# Patient Record
Sex: Female | Born: 1955 | Race: Asian | Hispanic: No | Marital: Married | Smoking: Never smoker
Health system: Southern US, Community
[De-identification: ages and names within clinical notes are randomized; demographics above are authoritative.]

## PROBLEM LIST (undated history)

## (undated) DIAGNOSIS — E785 Hyperlipidemia, unspecified: Secondary | ICD-10-CM

## (undated) DIAGNOSIS — I340 Nonrheumatic mitral (valve) insufficiency: Secondary | ICD-10-CM

## (undated) DIAGNOSIS — I219 Acute myocardial infarction, unspecified: Secondary | ICD-10-CM

## (undated) DIAGNOSIS — E119 Type 2 diabetes mellitus without complications: Secondary | ICD-10-CM

## (undated) DIAGNOSIS — I255 Ischemic cardiomyopathy: Secondary | ICD-10-CM

## (undated) DIAGNOSIS — K5909 Other constipation: Secondary | ICD-10-CM

## (undated) DIAGNOSIS — E1140 Type 2 diabetes mellitus with diabetic neuropathy, unspecified: Secondary | ICD-10-CM

## (undated) DIAGNOSIS — M545 Low back pain, unspecified: Secondary | ICD-10-CM

## (undated) DIAGNOSIS — G8929 Other chronic pain: Secondary | ICD-10-CM

## (undated) DIAGNOSIS — I2510 Atherosclerotic heart disease of native coronary artery without angina pectoris: Secondary | ICD-10-CM

## (undated) DIAGNOSIS — I420 Dilated cardiomyopathy: Secondary | ICD-10-CM

## (undated) HISTORY — PX: VAGINAL HYSTERECTOMY: SUR661

## (undated) HISTORY — PX: CATARACT EXTRACTION W/ INTRAOCULAR LENS  IMPLANT, BILATERAL: SHX1307

## (undated) HISTORY — PX: EYE SURGERY: SHX253

## (undated) HISTORY — DX: Nonrheumatic mitral (valve) insufficiency: I34.0

## (undated) HISTORY — DX: Dilated cardiomyopathy: I25.5

## (undated) HISTORY — DX: Ischemic cardiomyopathy: I42.0

## (undated) HISTORY — DX: Hyperlipidemia, unspecified: E78.5

## (undated) HISTORY — PX: CORONARY ANGIOPLASTY WITH STENT PLACEMENT: SHX49

---

## 2009-01-21 DIAGNOSIS — I219 Acute myocardial infarction, unspecified: Secondary | ICD-10-CM

## 2009-01-21 HISTORY — DX: Acute myocardial infarction, unspecified: I21.9

## 2012-02-10 ENCOUNTER — Emergency Department (HOSPITAL_COMMUNITY): Payer: No Typology Code available for payment source

## 2012-02-10 ENCOUNTER — Emergency Department (HOSPITAL_COMMUNITY)
Admission: EM | Admit: 2012-02-10 | Discharge: 2012-02-10 | Disposition: A | Discharge status: Nursing Home (Includes ICF) | Payer: No Typology Code available for payment source | Source: Home / Self Care

## 2012-02-10 ENCOUNTER — Encounter (HOSPITAL_COMMUNITY): Payer: Self-pay | Admitting: *Deleted

## 2012-02-10 ENCOUNTER — Observation Stay (HOSPITAL_COMMUNITY)
Admission: EM | Admit: 2012-02-10 | Discharge: 2012-02-11 | Disposition: A | Discharge status: Home / Self Care | Payer: No Typology Code available for payment source | Attending: Cardiology | Admitting: Cardiology

## 2012-02-10 ENCOUNTER — Encounter (HOSPITAL_COMMUNITY): Payer: Self-pay | Admitting: Emergency Medicine

## 2012-02-10 DIAGNOSIS — Z9861 Coronary angioplasty status: Secondary | ICD-10-CM | POA: Insufficient documentation

## 2012-02-10 DIAGNOSIS — R0602 Shortness of breath: Secondary | ICD-10-CM | POA: Insufficient documentation

## 2012-02-10 DIAGNOSIS — E119 Type 2 diabetes mellitus without complications: Secondary | ICD-10-CM

## 2012-02-10 DIAGNOSIS — D509 Iron deficiency anemia, unspecified: Secondary | ICD-10-CM | POA: Insufficient documentation

## 2012-02-10 DIAGNOSIS — R0989 Other specified symptoms and signs involving the circulatory and respiratory systems: Secondary | ICD-10-CM

## 2012-02-10 DIAGNOSIS — D649 Anemia, unspecified: Secondary | ICD-10-CM | POA: Diagnosis present

## 2012-02-10 DIAGNOSIS — R0600 Dyspnea, unspecified: Secondary | ICD-10-CM

## 2012-02-10 DIAGNOSIS — I252 Old myocardial infarction: Secondary | ICD-10-CM

## 2012-02-10 DIAGNOSIS — E785 Hyperlipidemia, unspecified: Secondary | ICD-10-CM | POA: Diagnosis present

## 2012-02-10 DIAGNOSIS — R0609 Other forms of dyspnea: Secondary | ICD-10-CM

## 2012-02-10 DIAGNOSIS — Z8679 Personal history of other diseases of the circulatory system: Secondary | ICD-10-CM

## 2012-02-10 DIAGNOSIS — I509 Heart failure, unspecified: Principal | ICD-10-CM | POA: Insufficient documentation

## 2012-02-10 DIAGNOSIS — R072 Precordial pain: Secondary | ICD-10-CM | POA: Insufficient documentation

## 2012-02-10 DIAGNOSIS — I10 Essential (primary) hypertension: Secondary | ICD-10-CM | POA: Insufficient documentation

## 2012-02-10 DIAGNOSIS — R079 Chest pain, unspecified: Secondary | ICD-10-CM

## 2012-02-10 DIAGNOSIS — I2510 Atherosclerotic heart disease of native coronary artery without angina pectoris: Secondary | ICD-10-CM | POA: Insufficient documentation

## 2012-02-10 DIAGNOSIS — R209 Unspecified disturbances of skin sensation: Secondary | ICD-10-CM | POA: Insufficient documentation

## 2012-02-10 HISTORY — DX: Type 2 diabetes mellitus with diabetic neuropathy, unspecified: E11.40

## 2012-02-10 HISTORY — DX: Atherosclerotic heart disease of native coronary artery without angina pectoris: I25.10

## 2012-02-10 HISTORY — DX: Hyperlipidemia, unspecified: E78.5

## 2012-02-10 LAB — CBC WITH DIFFERENTIAL/PLATELET
Basophils Absolute: 0 10*3/uL (ref 0.0–0.1)
Basophils Relative: 0 % (ref 0–1)
Eosinophils Absolute: 0.1 10*3/uL (ref 0.0–0.7)
Eosinophils Relative: 1 % (ref 0–5)
HCT: 35.8 % — ABNORMAL LOW (ref 36.0–46.0)
Hemoglobin: 11.2 g/dL — ABNORMAL LOW (ref 12.0–15.0)
Lymphocytes Relative: 35 % (ref 12–46)
Lymphs Abs: 3.3 K/uL (ref 0.7–4.0)
MCH: 22 pg — ABNORMAL LOW (ref 26.0–34.0)
MCHC: 31.3 g/dL (ref 30.0–36.0)
MCV: 70.3 fL — ABNORMAL LOW (ref 78.0–100.0)
Monocytes Absolute: 0.7 10*3/uL (ref 0.1–1.0)
Monocytes Relative: 7 % (ref 3–12)
Neutro Abs: 5.2 K/uL (ref 1.7–7.7)
Neutrophils Relative %: 56 % (ref 43–77)
Platelets: 300 10*3/uL (ref 150–400)
RBC: 5.09 MIL/uL (ref 3.87–5.11)
RDW: 16.3 % — ABNORMAL HIGH (ref 11.5–15.5)
WBC: 9.3 K/uL (ref 4.0–10.5)

## 2012-02-10 LAB — PROTIME-INR
INR: 0.94 (ref 0.00–1.49)
Prothrombin Time: 12.5 s (ref 11.6–15.2)

## 2012-02-10 LAB — BASIC METABOLIC PANEL
CO2: 27 mEq/L (ref 19–32)
Calcium: 9.2 mg/dL (ref 8.4–10.5)
Creatinine, Ser: 0.88 mg/dL (ref 0.50–1.10)
GFR calc non Af Amer: 71 mL/min — ABNORMAL LOW (ref >90)
Glucose, Bld: 155 mg/dL — ABNORMAL HIGH (ref 70–99)
Sodium: 138 mEq/L (ref 135–145)

## 2012-02-10 LAB — CREATININE, SERUM
Creatinine, Ser: 0.89 mg/dL (ref 0.50–1.10)
GFR calc Af Amer: 81 mL/min — ABNORMAL LOW (ref >90)

## 2012-02-10 LAB — GLUCOSE, CAPILLARY: Glucose-Capillary: 159 mg/dL — ABNORMAL HIGH (ref 70–99)

## 2012-02-10 LAB — CBC
Hemoglobin: 11 g/dL — ABNORMAL LOW (ref 12.0–15.0)
MCH: 22.3 pg — ABNORMAL LOW (ref 26.0–34.0)
MCV: 70.8 fL — ABNORMAL LOW (ref 78.0–100.0)
RBC: 4.93 MIL/uL (ref 3.87–5.11)

## 2012-02-10 LAB — PRO B NATRIURETIC PEPTIDE: Pro B Natriuretic peptide (BNP): 659.3 pg/mL — ABNORMAL HIGH (ref 0–125)

## 2012-02-10 LAB — APTT: aPTT: 27 seconds (ref 24–37)

## 2012-02-10 LAB — BASIC METABOLIC PANEL WITH GFR
BUN: 12 mg/dL (ref 6–23)
Chloride: 103 meq/L (ref 96–112)
GFR calc Af Amer: 82 mL/min — ABNORMAL LOW (ref >90)
Potassium: 4.9 meq/L (ref 3.5–5.1)

## 2012-02-10 LAB — TROPONIN I: Troponin I: <0.3 ng/mL (ref <0.30)

## 2012-02-10 MED ORDER — FAMOTIDINE 20 MG PO TABS
20.0000 mg | ORAL_TABLET | Freq: Two times a day (BID) | ORAL | Status: DC
  Administered 2012-02-10 – 2012-02-11 (×2): 20 mg via ORAL
  Filled 2012-02-10 (×3): qty 1

## 2012-02-10 MED ORDER — CARVEDILOL 3.125 MG PO TABS
3.1250 mg | ORAL_TABLET | Freq: Two times a day (BID) | ORAL | Status: DC
  Administered 2012-02-11: 3.125 mg via ORAL
  Filled 2012-02-10 (×3): qty 1

## 2012-02-10 MED ORDER — SODIUM CHLORIDE 0.9 % IJ SOLN: 3.0000 mL | INTRAMUSCULAR | Status: DC | PRN

## 2012-02-10 MED ORDER — ATORVASTATIN CALCIUM 40 MG PO TABS
40.0000 mg | ORAL_TABLET | Freq: Every day | ORAL | Status: DC
  Administered 2012-02-11: 40 mg via ORAL
  Filled 2012-02-10: qty 1

## 2012-02-10 MED ORDER — ONDANSETRON HCL 4 MG/2ML IJ SOLN: 4.0000 mg | Freq: Four times a day (QID) | INTRAMUSCULAR | Status: DC | PRN

## 2012-02-10 MED ORDER — INSULIN ASPART 100 UNIT/ML ~~LOC~~ SOLN
0.0000 [IU] | Freq: Three times a day (TID) | SUBCUTANEOUS | Status: DC
  Administered 2012-02-11: 3 [IU] via SUBCUTANEOUS
  Administered 2012-02-11: 1 [IU] via SUBCUTANEOUS

## 2012-02-10 MED ORDER — HEPARIN SODIUM (PORCINE) 5000 UNIT/ML IJ SOLN
5000.0000 [IU] | Freq: Three times a day (TID) | INTRAMUSCULAR | Status: DC
  Administered 2012-02-10 – 2012-02-11 (×2): 5000 [IU] via SUBCUTANEOUS
  Filled 2012-02-10 (×5): qty 1

## 2012-02-10 MED ORDER — FUROSEMIDE 10 MG/ML IJ SOLN
40.0000 mg | Freq: Once | INTRAMUSCULAR | Status: AC
  Administered 2012-02-10: 40 mg via INTRAVENOUS
  Filled 2012-02-10: qty 4

## 2012-02-10 MED ORDER — NITROGLYCERIN 2 % TD OINT
1.0000 [in_us] | TOPICAL_OINTMENT | Freq: Once | TRANSDERMAL | Status: AC
  Administered 2012-02-10: 1 [in_us] via TOPICAL
  Filled 2012-02-10: qty 1

## 2012-02-10 MED ORDER — SODIUM CHLORIDE 0.9 % IV SOLN: 250.0000 mL | INTRAVENOUS | Status: DC | PRN

## 2012-02-10 MED ORDER — INSULIN ASPART 100 UNIT/ML ~~LOC~~ SOLN
10.0000 [IU] | Freq: Three times a day (TID) | SUBCUTANEOUS | Status: DC
  Administered 2012-02-11 (×2): 10 [IU] via SUBCUTANEOUS

## 2012-02-10 MED ORDER — NITROGLYCERIN 0.4 MG SL SUBL
0.4000 mg | SUBLINGUAL_TABLET | SUBLINGUAL | Status: DC | PRN
  Administered 2012-02-10: 0.4 mg via SUBLINGUAL

## 2012-02-10 MED ORDER — CLOPIDOGREL BISULFATE 75 MG PO TABS
75.0000 mg | ORAL_TABLET | Freq: Every day | ORAL | Status: DC
  Administered 2012-02-11: 75 mg via ORAL
  Filled 2012-02-10: qty 1

## 2012-02-10 MED ORDER — ASPIRIN 81 MG PO CHEW
324.0000 mg | CHEWABLE_TABLET | Freq: Once | ORAL | Status: DC
  Filled 2012-02-10 (×2): qty 1

## 2012-02-10 MED ORDER — LISINOPRIL 2.5 MG PO TABS
2.5000 mg | ORAL_TABLET | Freq: Every day | ORAL | Status: DC
  Administered 2012-02-11: 2.5 mg via ORAL
  Filled 2012-02-10: qty 1

## 2012-02-10 MED ORDER — GABAPENTIN 300 MG PO CAPS
300.0000 mg | ORAL_CAPSULE | Freq: Three times a day (TID) | ORAL | Status: DC
  Administered 2012-02-11: 300 mg via ORAL
  Filled 2012-02-10 (×4): qty 1

## 2012-02-10 MED ORDER — SODIUM CHLORIDE 0.9 % IV SOLN: Freq: Once | INTRAVENOUS | Status: DC

## 2012-02-10 MED ORDER — GABAPENTIN 300 MG PO CAPS
300.0000 mg | ORAL_CAPSULE | Freq: Once | ORAL | Status: AC
  Administered 2012-02-10: 300 mg via ORAL
  Filled 2012-02-10: qty 1

## 2012-02-10 MED ORDER — SODIUM CHLORIDE 0.9 % IJ SOLN: 3.0000 mL | Freq: Two times a day (BID) | INTRAMUSCULAR | Status: DC

## 2012-02-10 MED ORDER — NITROGLYCERIN 0.4 MG SL SUBL: 0.4000 mg | SUBLINGUAL_TABLET | SUBLINGUAL | Status: DC | PRN

## 2012-02-10 MED ORDER — NITROGLYCERIN 0.4 MG SL SUBL
0.4000 mg | SUBLINGUAL_TABLET | SUBLINGUAL | Status: DC | PRN
  Administered 2012-02-10: 0.4 mg via SUBLINGUAL
  Filled 2012-02-10: qty 25

## 2012-02-10 MED ORDER — INSULIN GLARGINE 100 UNIT/ML ~~LOC~~ SOLN
35.0000 [IU] | Freq: Every day | SUBCUTANEOUS | Status: DC
  Administered 2012-02-10: 35 [IU] via SUBCUTANEOUS

## 2012-02-10 MED ORDER — ACETAMINOPHEN 325 MG PO TABS: 650.0000 mg | ORAL_TABLET | ORAL | Status: DC | PRN

## 2012-02-10 MED ORDER — ASPIRIN 81 MG PO CHEW
81.0000 mg | CHEWABLE_TABLET | Freq: Every day | ORAL | Status: DC
  Administered 2012-02-11: 81 mg via ORAL

## 2012-02-10 NOTE — ED Notes (Signed)
Pt placed on 12 lead EKG, pulse ox, and BP monitor.

## 2012-02-10 NOTE — ED Provider Notes (Signed)
History     CSN: [id number]  Arrival date & time 02/10/12  1002   None     Chief Complaint  Patient presents with  . Back Pain    (Consider location/radiation/quality/duration/timing/severity/associated sxs/prior treatment) HPI Comments:     57 year old female who recently moved here from [place] has a myriad of complaints.Her chief complaint is that of anterior chest pain. She describes it as a heavy feeling in his if something was sitting on her chest. It is associated with increase in dyspnea at rest and exertional. She denies diaphoresis. Her PMH is significant for MI and angioplasty in November of 2013. She is morbidly obese with several risk factors for Cardiovascular event.  She complains of various aches and pains for the past 4-5 years. These aches and pains occur in the back legs hips and arms. She has type 2 diabetes and is treated with insulin and she believes she was told that much of her pain was due to diabetic peripheral neuropathy. She is taking gabapentin that she is out of this medicine as well as several other medications. She is requesting a refill on several of her chronic medications. She is also feeling generally weak and overall poorly.  Her chief complaint is that of anterior chest pain. She describes it as a heavy feeling in his if something was sitting on her chest. It is associated with increase in dyspnea at rest and exertional. She denies diaphoresis. Her PMH is significant for MI and angioplasty in November of 2013. She is morbidly obese with several risk factors for Cardiovascular event.    Past Medical History  Diagnosis Date  . Diabetes mellitus without complication   . Coronary artery disease   . Hypertension   . Hyperlipidemia     Past Surgical History  Procedure Date  . Abdominal surgery     History reviewed. No pertinent family history.  History  Substance Use Topics  . Smoking status: Never Smoker   . Smokeless tobacco: Not on file   . Alcohol Use: No    OB History    Grav Para Term Preterm Abortions TAB SAB Ect Mult Living                  Review of Systems  Constitutional: Positive for activity change and fatigue. Negative for fever and diaphoresis.  HENT: Negative.   Eyes: Negative.   Respiratory: Positive for shortness of breath. Negative for wheezing.   Cardiovascular: Positive for chest pain. Negative for palpitations.  Genitourinary: Negative.   Musculoskeletal: Positive for myalgias and back pain.  Neurological: Positive for weakness and numbness.  Psychiatric/Behavioral: Negative.     Allergies  Review of patient's allergies indicates no known allergies.  Home Medications   Current Outpatient Rx  Name  Route  Sig  Dispense  Refill  . ASPIRIN 325 MG PO TABS   Oral   Take 325 mg by mouth daily.         . ATORVASTATIN CALCIUM 80 MG PO TABS   Oral   Take 80 mg by mouth daily.         Marland Kitchen CLOPIDOGREL BISULFATE 75 MG PO TABS   Oral   Take 75 mg by mouth daily.         . INSULIN ASPART 100 UNIT/ML [place] SOLN   Subcutaneous   Inject 12 Units into the skin 3 (three) times daily before meals.         . INSULIN GLARGINE 100 UNIT/ML [place]  SOLN   Subcutaneous   Inject into the skin at bedtime.         Marland Kitchen LISINOPRIL 2.5 MG PO TABS   Oral   Take 2.5 mg by mouth daily.         Marland Kitchen METOPROLOL TARTRATE 25 MG PO TABS   Oral   Take 25 mg by mouth 2 (two) times daily. 1/2 tab         . PREGABALIN 75 MG PO CAPS   Oral   Take 75 mg by mouth 2 (two) times daily.           BP 127/76  Pulse 72  Temp 97.6 F (36.4 C) (Oral)  Resp 16  SpO2 97%  Physical Exam  Nursing note and vitals reviewed. Constitutional: She is oriented to person, place, and time. She appears well-nourished. No distress.       Morbidly obese  HENT:  Head: Normocephalic and atraumatic.  Eyes: Conjunctivae normal and EOM are normal.  Neck: Normal range of motion. Neck supple.  Cardiovascular: Normal rate, regular  rhythm and normal heart sounds.   Pulmonary/Chest: Effort normal and breath sounds normal. No respiratory distress.  Musculoskeletal:       Pain with ambulation  Lymphadenopathy:    She has no cervical adenopathy.  Neurological: She is alert and oriented to person, place, and time.  Skin: Skin is warm and dry.  Psychiatric: She has a normal mood and affect.    ED Course  Procedures (including critical care time)  Labs Reviewed - No data to display No results found.   1. Coronary artery disease   2. Chest pain   3. Dyspnea   4. H/O myocardial infarction of inferior wall, greater than 8 weeks   5. T2DM (type 2 diabetes mellitus)       MDM   57 year old female with a significant past medical history for MI last November and treated with angioplsty. For the past 2-3 days she has been having chest pain it feels like something is sitting on her chest. It is associated with shortness of breath. She also has a myriad of complaints involving aches and pains possibly related in part, to perform neuropathy. She has diabetes and is treated with insulin. She has multiple risk factors for cardiovascular disease. She is being transferred to the [facility] emergency department for chest pain evaluation.  Once her evaluation for chest pain has been completed she may be referred to the adult care center here in the urgent care Department. She should call and make an appointment for followup as soon as possible.         Hayden Rasmussen, NP 02/10/12 [phone]

## 2012-02-10 NOTE — ED Notes (Signed)
Cardiac monitor showing NSR with no ectopics, rate 72

## 2012-02-10 NOTE — ED Provider Notes (Signed)
History     CSN: [id number]  Arrival date & time 02/10/12  1236   First MD Initiated Contact with Patient 02/10/12 1240      Chief Complaint  Patient presents with  . Chest Pain  . Numbness    (Consider location/radiation/quality/duration/timing/severity/associated sxs/prior treatment) HPI Comments: Patient is sent here from the urgent care where she presented do to 2 days of chest pain and mild shortness of breath that is worse with exertion. She has a significant history of receiving 3 cardiac stents and a hospital in [place] after a heart attack in November of 2011. She has a history of diabetes, high blood pressure as well. She and her spouse move here to [facility] apparently 6 months if not establish with a primary care physician nor a cardiologist yet. She denies runny nose, cough, congestion, fever or chills. Patient is [place] and does understand English fairly well but there is a mild language barrier present. She reports the pain is in the left anterior chest area, occasionally radiates into the shoulders and arms bilaterally. She denies sweats. Denies nausea or vomiting. She reports that she has not eaten or taken any of her medications yet this morning.  Pt has been taking SL NTG at home with no sig change in pain.  Currently pain is a 3-4 / 10.  The history is provided by the patient, the spouse and medical records.    Past Medical History  Diagnosis Date  . Diabetes mellitus without complication   . Coronary artery disease   . Hypertension   . Hyperlipidemia     Past Surgical History  Procedure Date  . Abdominal surgery     History reviewed. No pertinent family history.  History  Substance Use Topics  . Smoking status: Never Smoker   . Smokeless tobacco: Not on file  . Alcohol Use: No    OB History    Grav Para Term Preterm Abortions TAB SAB Ect Mult Living                  Review of Systems  Constitutional: Negative for fever, chills and diaphoresis.    HENT: Negative for congestion and rhinorrhea.   Respiratory: Positive for chest tightness and shortness of breath. Negative for cough.   Cardiovascular: Positive for chest pain. Negative for palpitations.  Gastrointestinal: Negative for nausea and abdominal pain.  Musculoskeletal: Negative for back pain.  All other systems reviewed and are negative.    Allergies  Review of patient's allergies indicates no known allergies.  Home Medications   Current Outpatient Rx  Name  Route  Sig  Dispense  Refill  . ASPIRIN 325 MG PO TABS   Oral   Take 325 mg by mouth daily.         . ATORVASTATIN CALCIUM 40 MG PO TABS   Oral   Take 40 mg by mouth daily.         Marland Kitchen CARVEDILOL 3.125 MG PO TABS   Oral   Take 3.125 mg by mouth 2 (two) times daily with a meal.         . CLOPIDOGREL BISULFATE 75 MG PO TABS   Oral   Take 75 mg by mouth daily.         Marland Kitchen GABAPENTIN 300 MG PO CAPS   Oral   Take 300 mg by mouth 3 (three) times daily.         . INSULIN ASPART 100 UNIT/ML [place] SOLN   Subcutaneous  Inject 10 Units into the skin 3 (three) times daily before meals.          . INSULIN GLARGINE 100 UNIT/ML [place] SOLN   Subcutaneous   Inject 35 Units into the skin at bedtime.          Marland Kitchen LISINOPRIL 2.5 MG PO TABS   Oral   Take 2.5 mg by mouth daily.         Marland Kitchen LOSARTAN POTASSIUM 25 MG PO TABS   Oral   Take 25 mg by mouth daily.           BP 119/78  Pulse 71  Temp 97.5 F (36.4 C) (Oral)  Resp 20  SpO2 100%  Physical Exam  Nursing note and vitals reviewed. Constitutional: She is oriented to person, place, and time. She appears well-developed and well-nourished. No distress.  HENT:  Head: Normocephalic and atraumatic.  Eyes: Pupils are equal, round, and reactive to light. No scleral icterus.  Neck: Normal range of motion. Neck supple.  Cardiovascular: Normal rate and regular rhythm.   No murmur heard. Pulmonary/Chest: Effort normal. No respiratory distress. She has no  wheezes.  Abdominal: Soft.  Neurological: She is alert and oriented to person, place, and time.  Skin: Skin is warm and dry. No rash noted. She is not diaphoretic.  Psychiatric: She has a normal mood and affect.    ED Course  Procedures (including critical care time)   Labs Reviewed  CBC WITH DIFFERENTIAL  BASIC METABOLIC PANEL  TROPONIN I  APTT  PROTIME-INR   No results found.   No diagnosis found.  ECG from the urgent care at time 11:30, shows normal sinus rhythm at a rate of 67, borderline low QRS voltages, normal axis, no overt ST or T-wave abnormalities. I interpret this to be a borderline EKG. No priors are available.  Oxygen saturation on 2 L nasal cannula is 100% I interpret this to be adequate,   2:15 PM Patient's chest pain is resolved after sublingual nitroglycerin here. Nitro paste is applied to ensure that she remains chest pain-free. Her initial troponin here is normal. Chest x-ray, portable but I also reviewed shows some cardiomegaly but no other acute abnormalities. Per the radiologist, however there may be some atelectasis or early infiltrate. However her O2 sats are normal, she has not been coughing and her white count is normal, therefore I do not suspect the patient has pneumonia.  3:10 PM Spoke to [employer] who will see pt in the ED.  MDM   Patient with apparent history of coronary disease status post MI 3 stents placed in [place] approximately 2 years ago. Patient apparently has not had too much difficulty as far as angina up until the last few days where she has had constant chest pain worse with exertion requiring sublingual nitroglycerin. Here her ECG did not show any acute ischemia, however has 4/10 chest pain that she endorses is worse with exertion. There is a very slight reproducible component however. Given the patient does not have regular care here locally, she has multiple cardiac risk factors, my plan is to consult with the [facility]  cardiology for admission to the hospital for further evaluation.   2:32 PM We contacted the hospital at which her discharge paperwork is from and are awaiting discharge summary or cath report.         Gavin Pound. Arnesia Vincelette, MD 02/10/12 1533

## 2012-02-10 NOTE — ED Notes (Signed)
Pt here with c/o center chest pain 3/10 onset x2-3 days with SOB and upper extremity numbness bilaterally. Denies nausea. Pt reports Hx of MI last November. BP 152/74 18, O2  100% on 2L [place].

## 2012-02-10 NOTE — ED Provider Notes (Signed)
Medical screening examination/treatment/procedure(s) were performed by non-physician practitioner and as supervising physician I was immediately available for consultation/collaboration.  Leslee Home, M.D.   Reuben Likes, MD 02/10/12 [phone]

## 2012-02-10 NOTE — H&P (Signed)
See note labeled consult from same day Kathleen Holt

## 2012-02-10 NOTE — ED Notes (Signed)
[facility] here for transport. 

## 2012-02-10 NOTE — ED Notes (Addendum)
Pt reports she has had left shoulder, low back and left hip pain x 5 years along with intermittent bilateral arm  Numbness.   She also c/o pain in the throat/upper chest x 2 1/2 years--that has gotten worse the past 3-4 days.       Today she denies fever N or V, but has had trouble sleeping lately

## 2012-02-10 NOTE — ED Notes (Signed)
Iv attempt x 1, asked Brad EMTP to assess

## 2012-02-10 NOTE — ED Notes (Signed)
[facility] called for transport 

## 2012-02-10 NOTE — ED Notes (Signed)
IV attempted x2 (RFA, RAC) w/o success by this Clinical research associate (B. Brandie Lopes, EMT-P). Both sites infiltrated when fluid was introduced.

## 2012-02-10 NOTE — Consult Note (Signed)
HPI: 57 year old female with past medical history of diabetes, hyperlipidemia and coronary artery disease for evaluation of chest pain and dyspnea. Patient recently moved here from [place]. She had a myocardial infarction in 2011. I do not have those records available. She is also from [place] and there is a mild language barrier. She apparently has had chest pain for 2 days. The pain is substernal and in the left breast. It increases with certain movements and activities. It lasts 2 minutes and resolves spontaneously. No nausea, vomiting, diaphoresis or associated dyspnea. She also describes dyspnea on exertion. No orthopnea, PND or pedal edema.   (Not in a hospital admission)  No Known Allergies  Past Medical History  Diagnosis Date  . Diabetes mellitus without complication   . Coronary artery disease   . Hyperlipidemia     History reviewed. No pertinent past surgical history.  History   Social History  . Marital Status: Married    Spouse Name: N/A    Number of Children: 3  . Years of Education: N/A   Occupational History  . Not on file.   Social History Main Topics  . Smoking status: Never Smoker   . Smokeless tobacco: Not on file  . Alcohol Use: No  . Drug Use: No  . Sexually Active: Yes    Birth Control/ Protection: Post-menopausal   Other Topics Concern  . Not on file   Social History Narrative  . No narrative on file    Family History  Problem Relation Age of Onset  . CAD Father     MI at age 13    ROS:  Back pain but no fevers or chills, productive cough, hemoptysis, dysphasia, odynophagia, melena, hematochezia, dysuria, hematuria, rash, seizure activity, orthopnea, PND, pedal edema, claudication. Remaining systems are negative.  Physical Exam:   Blood pressure 104/62, pulse 69, temperature 97.5 F (36.4 C), temperature source Oral, resp. rate 18, SpO2 98.00%.  General:  Well developed/obese in NAD Skin warm/dry Patient not depressed No peripheral  clubbing Back-normal HEENT-normal/normal eyelids Neck supple/normal carotid upstroke bilaterally; no bruits; no JVD; no thyromegaly chest - CTA/ normal expansion; chest pain reproduced with palpation CV - RRR/normal S1 and S2; no murmurs, rubs or gallops;  PMI nondisplaced Abdomen -NT/ND, no HSM, no mass, + bowel sounds, no bruit 2+ femoral pulses, no bruits Ext-no edema, chords, 2+ DP Neuro-grossly nonfocal  ECG NSR with no ST changes  Results for orders placed during the hospital encounter of 02/10/12 (from the past 48 hour(s))  CBC WITH DIFFERENTIAL     Status: Abnormal   Collection Time   02/10/12  1:14 PM      Component Value Range Comment   WBC 9.3  4.0 - 10.5 K/uL    RBC 5.09  3.87 - 5.11 MIL/uL    Hemoglobin 11.2 (*) 12.0 - 15.0 g/dL    HCT 08.6 (*) 57.8 - 46.0 %    MCV 70.3 (*) 78.0 - 100.0 fL    MCH 22.0 (*) 26.0 - 34.0 pg    MCHC 31.3  30.0 - 36.0 g/dL    RDW 46.9 (*) 62.9 - 15.5 %    Platelets 300  150 - 400 K/uL    Neutrophils Relative 56  43 - 77 %    Neutro Abs 5.2  1.7 - 7.7 K/uL    Lymphocytes Relative 35  12 - 46 %    Lymphs Abs 3.3  0.7 - 4.0 K/uL    Monocytes Relative 7  3 - 12 %    Monocytes Absolute 0.7  0.1 - 1.0 K/uL    Eosinophils Relative 1  0 - 5 %    Eosinophils Absolute 0.1  0.0 - 0.7 K/uL    Basophils Relative 0  0 - 1 %    Basophils Absolute 0.0  0.0 - 0.1 K/uL   BASIC METABOLIC PANEL     Status: Abnormal   Collection Time   02/10/12  1:14 PM      Component Value Range Comment   Sodium 138  135 - 145 mEq/L    Potassium 4.9  3.5 - 5.1 mEq/L    Chloride 103  96 - 112 mEq/L    CO2 27  19 - 32 mEq/L    Glucose, Bld 155 (*) 70 - 99 mg/dL    BUN 12  6 - 23 mg/dL    Creatinine, Ser 1.61  0.50 - 1.10 mg/dL    Calcium 9.2  8.4 - 09.6 mg/dL    GFR calc non Af Amer 71 (*) >90 mL/min    GFR calc Af Amer 82 (*) >90 mL/min   TROPONIN I     Status: Normal   Collection Time   02/10/12  1:14 PM      Component Value Range Comment   Troponin I <0.30   <0.30 ng/mL   APTT     Status: Normal   Collection Time   02/10/12  1:14 PM      Component Value Range Comment   aPTT 27  24 - 37 seconds   PROTIME-INR     Status: Normal   Collection Time   02/10/12  1:14 PM      Component Value Range Comment   Prothrombin Time 12.5  11.6 - 15.2 seconds    INR 0.94  0.00 - 1.49   PRO B NATRIURETIC PEPTIDE     Status: Abnormal   Collection Time   02/10/12  1:14 PM      Component Value Range Comment   Pro B Natriuretic peptide (BNP) 659.3 (*) 0 - 125 pg/mL     Dg Chest Port 1 View  02/10/2012  *RADIOLOGY REPORT*  Clinical Data: Chest pain  PORTABLE CHEST - 1 VIEW  Comparison: None.  Findings: Cardiomegaly is noted.  No pulmonary edema.  There is hazy left basilar atelectasis or infiltrate. Bony thorax is unremarkable.  IMPRESSION: Cardiomegaly.  No pulmonary edema.  Hazy left basilar atelectasis or infiltrate.   Original Report Authenticated By: Natasha Mead, M.D.     Assessment/Plan 1 Dyspnea - Patient is dyspneic and BNP mildly elevated; will gently diurese. Repeat chest xray in AM. Echo in AM; need outside records 2 Chest pain - symptoms atypical; RO MI; if enzymes neg, plan outpatient myoview. 3 Microcytic anemia - repeat CBC in AM; if stable, plan outpatient GI eval 4 DM - continue present meds and follow CBG 5 Hyperlipidemia - continue statin 6 CAD - Continue ASA, plavix, statin, coreg and ACEI; DC ARB Will need to establish with primary care physician following DC. Olga Millers MD 02/10/2012, 5:32 PM

## 2012-02-11 ENCOUNTER — Other Ambulatory Visit: Payer: Self-pay | Admitting: Nurse Practitioner

## 2012-02-11 ENCOUNTER — Encounter (HOSPITAL_COMMUNITY): Payer: Self-pay | Admitting: Nurse Practitioner

## 2012-02-11 ENCOUNTER — Observation Stay (HOSPITAL_COMMUNITY): Payer: No Typology Code available for payment source

## 2012-02-11 DIAGNOSIS — R079 Chest pain, unspecified: Secondary | ICD-10-CM

## 2012-02-11 DIAGNOSIS — E785 Hyperlipidemia, unspecified: Secondary | ICD-10-CM

## 2012-02-11 DIAGNOSIS — I509 Heart failure, unspecified: Secondary | ICD-10-CM

## 2012-02-11 DIAGNOSIS — I059 Rheumatic mitral valve disease, unspecified: Secondary | ICD-10-CM

## 2012-02-11 DIAGNOSIS — D649 Anemia, unspecified: Secondary | ICD-10-CM | POA: Diagnosis present

## 2012-02-11 HISTORY — DX: Hyperlipidemia, unspecified: E78.5

## 2012-02-11 LAB — CBC
Hemoglobin: 11.8 g/dL — ABNORMAL LOW (ref 12.0–15.0)
RBC: 5.27 MIL/uL — ABNORMAL HIGH (ref 3.87–5.11)
WBC: 7.2 10*3/uL (ref 4.0–10.5)

## 2012-02-11 LAB — BASIC METABOLIC PANEL
CO2: 25 mEq/L (ref 19–32)
Glucose, Bld: 212 mg/dL — ABNORMAL HIGH (ref 70–99)
Potassium: 3.9 mEq/L (ref 3.5–5.1)
Sodium: 138 mEq/L (ref 135–145)

## 2012-02-11 LAB — TSH: TSH: 1.882 u[IU]/mL (ref 0.350–4.500)

## 2012-02-11 LAB — GLUCOSE, CAPILLARY
Glucose-Capillary: 141 mg/dL — ABNORMAL HIGH (ref 70–99)
Glucose-Capillary: 250 mg/dL — ABNORMAL HIGH (ref 70–99)

## 2012-02-11 LAB — TROPONIN I
Troponin I: <0.3 ng/mL (ref <0.30)
Troponin I: <0.3 ng/mL (ref <0.30)

## 2012-02-11 MED ORDER — NITROGLYCERIN 0.4 MG SL SUBL: 0.4000 mg | SUBLINGUAL_TABLET | SUBLINGUAL | Status: DC | PRN

## 2012-02-11 MED ORDER — ASPIRIN 81 MG PO TABS: 325.0000 mg | ORAL_TABLET | Freq: Every day | ORAL | Status: DC

## 2012-02-11 NOTE — Discharge Summary (Signed)
Patient ID: Kathleen Holt,  MRN: [id number], DOB/AGE: [birth year] 57 y.o.  Admit date: 02/10/2012 Discharge date: 02/11/2012  Primary Care Provider: None Primary Cardiologist: Lovena Neighbours, MD  Discharge Diagnoses Principal Problem:  *Acute CHF  ** ? Systolic vs. Diastolic Active Problems:  CAD (coronary artery disease)  **Prior stenting in [place], [place] - records pending.  Precordial pain  **Negative CE this admission.  Diabetes mellitus  Dyslipidemia  Anemia  Allergies No Known Allergies  Procedures  None  History of Present Illness  57 y/o female with prior h/o CAD s/p stenting in [place], [place].  She and her husband moved to the [place] area recently but had not yet established local cardiology or primary care.  She was in her USOH until 2 days prior to admission when she developed chest discomfort involving her left breast and substernal area that was worse with certain movements and activities.  Pain typically lasted 2 minutes and resolved spontaneously.  She also noted DOE.  She presented to the [facility] ED on 1/20 where ECG showed no acute ST/T changes and ECG was non-acute.  Pro BNP was mildly elevated @ 659.3.  CXR showed no edema.  She was treated with one dose of IV lasix and admitted for further evaluation.  Hospital Course  Patient ruled out for MI.  She felt markedly better with modest diuresis with a net negative of 518 mls.  Weight was unchanged.  She's had no further chest pain.  Patient and husband wish to be discharged home today.  We have arranged for outpatient 2D echo and lexiscan myoview and will plan to see the patient back in the office in 1 weeks time.  Discharge Vitals Blood pressure 106/71, pulse 70, temperature 97.9 F (36.6 C), temperature source Oral, resp. rate 18, height 5' (1.524 m), weight 184 lb 4.8 oz (83.598 kg), SpO2 96.00%.  Filed Weights   02/10/12 2016 02/11/12 0615  Weight: 184 lb 4.8 oz (83.598 kg) 184 lb 4.8 oz (83.598 kg)    Labs  CBC  Basename 02/11/12 0915 02/10/12 2118 02/10/12 1314  WBC 7.2 8.4 --  NEUTROABS -- -- 5.2  HGB 11.8* 11.0* --  HCT 37.1 34.9* --  MCV 70.4* 70.8* --  PLT 306 296 --   Basic Metabolic Panel  Basename 02/11/12 0915 02/10/12 2118 02/10/12 1314  NA 138 -- 138  K 3.9 -- 4.9  CL 100 -- 103  CO2 25 -- 27  GLUCOSE 212* -- 155*  BUN 12 -- 12  CREATININE 0.92 0.89 --  CALCIUM 9.1 -- 9.2  MG -- -- --  PHOS -- -- --   Cardiac Enzymes  Basename 02/11/12 0915 02/11/12 0236 02/10/12 2118  CKTOTAL -- -- --  CKMB -- -- --  CKMBINDEX -- -- --  TROPONINI <0.30 <0.30 <0.30   Thyroid Function Tests  Basename 02/10/12 2118  TSH 1.882  T4TOTAL --  T3FREE --  THYROIDAB --   Disposition  Pt is being discharged home today in good condition.  Follow-up Plans & Appointments  Follow-up Information    Follow up with Willa Rough, MD. (We will arrange follow-up and contact you.)    Contact information:   [address] [place] [address] [phone]        Discharge Medications    Medication List     As of 02/11/2012  5:17 PM    STOP taking these medications         losartan 25 MG tablet  Commonly known as: COZAAR      TAKE these medications         aspirin 81 MG tablet   Take 4 tablets (325 mg total) by mouth daily.      atorvastatin 40 MG tablet   Commonly known as: LIPITOR   Take 40 mg by mouth daily.      carvedilol 3.125 MG tablet   Commonly known as: COREG   Take 3.125 mg by mouth 2 (two) times daily with a meal.      clopidogrel 75 MG tablet   Commonly known as: PLAVIX   Take 75 mg by mouth daily.      gabapentin 300 MG capsule   Commonly known as: NEURONTIN   Take 300 mg by mouth 3 (three) times daily.      insulin aspart 100 UNIT/ML injection   Commonly known as: novoLOG   Inject 10 Units into the skin 3 (three) times daily before meals.      insulin glargine 100 UNIT/ML injection   Commonly known as: LANTUS    Inject 35 Units into the skin at bedtime.      lisinopril 2.5 MG tablet   Commonly known as: PRINIVIL,ZESTRIL   Take 2.5 mg by mouth daily.      nitroGLYCERIN 0.4 MG SL tablet   Commonly known as: NITROSTAT   Place 1 tablet (0.4 mg total) under the tongue every 5 (five) minutes x 3 doses as needed for chest pain.      Outstanding Labs/Studies  Lexiscan Cardiolite and 2D echocardiogram pending  Duration of Discharge Encounter   Greater than 30 minutes including physician time.  Signed, Nicolasa Ducking NP 02/11/2012, 5:17 PM  I saw the patient this morning and interacted with the patient and her husband. There is a complete progress note in this record. I made the decision to send the patient home. I outlined the plans and communicated this to Mr. Brion Aliment. I agree with the discharge summary as outlined above. The patient is improved. We are arranging for further outpatient evaluation.  Jerral Bonito, MD

## 2012-02-11 NOTE — Progress Notes (Signed)
Utilization review completed.  

## 2012-02-11 NOTE — Progress Notes (Signed)
Patient ID: Kathleen Holt, female   DOB: March 22, 1955, 57 y.o.   MRN: [id number]   SUBJECTIVE:     Patient was admitted yesterday. She has a diuresis and she is feeling better. There is no evidence of an MI. The patient does not speak English well. Her husband is in the room and communicates much better. He does have some records. We will copy These and review them further. There is A. History of 3 stents.    Filed Vitals:   02/10/12 1930 02/10/12 2016 02/10/12 2017 02/11/12 0615  BP: 126/66 138/82  106/71  Pulse: 71 72  70  Temp:  97.8 F (36.6 C)  97.9 F (36.6 C)  TempSrc:  Oral  Oral  Resp:  18  18  Height:  5' (1.524 m)    Weight:  184 lb 4.8 oz (83.598 kg)  184 lb 4.8 oz (83.598 kg)  SpO2: 100% 97% 97% 96%    Intake/Output Summary (Last 24 hours) at 02/11/12 0947 Last data filed at 02/11/12 0700  Gross per 24 hour  Intake    232 ml  Output    750 ml  Net   -518 ml    LABS: Basic Metabolic Panel:  Basename 02/10/12 2118 02/10/12 1314  NA -- 138  K -- 4.9  CL -- 103  CO2 -- 27  GLUCOSE -- 155*  BUN -- 12  CREATININE 0.89 0.88  CALCIUM -- 9.2  MG -- --  PHOS -- --   Liver Function Tests: No results found for this basename: AST:2,ALT:2,ALKPHOS:2,BILITOT:2,PROT:2,ALBUMIN:2 in the last 72 hours No results found for this basename: LIPASE:2,AMYLASE:2 in the last 72 hours CBC:  Basename 02/10/12 2118 02/10/12 1314  WBC 8.4 9.3  NEUTROABS -- 5.2  HGB 11.0* 11.2*  HCT 34.9* 35.8*  MCV 70.8* 70.3*  PLT 296 300   Cardiac Enzymes:  Basename 02/11/12 0236 02/10/12 2118 02/10/12 1314  CKTOTAL -- -- --  CKMB -- -- --  CKMBINDEX -- -- --  TROPONINI <0.30 <0.30 <0.30   BNP: No components found with this basename: POCBNP:3 D-Dimer: No results found for this basename: DDIMER:2 in the last 72 hours Hemoglobin A1C: No results found for this basename: HGBA1C in the last 72 hours Fasting Lipid Panel: No results found for this basename:  CHOL,HDL,LDLCALC,TRIG,CHOLHDL,LDLDIRECT in the last 72 hours Thyroid Function Tests:  [facility] 02/10/12 2118  TSH 1.882  T4TOTAL --  T3FREE --  THYROIDAB --    RADIOLOGY: Dg Chest Port 1 View  02/10/2012  *RADIOLOGY REPORT*  Clinical Data: Chest pain  PORTABLE CHEST - 1 VIEW  Comparison: None.  Findings: Cardiomegaly is noted.  No pulmonary edema.  There is hazy left basilar atelectasis or infiltrate. Bony thorax is unremarkable.  IMPRESSION: Cardiomegaly.  No pulmonary edema.  Hazy left basilar atelectasis or infiltrate.   Original Report Authenticated By: Natasha Mead, M.D.    Dg Chest Port 1v Same Day  02/11/2012  *RADIOLOGY REPORT*  Clinical Data: Chest pain, CHF  PORTABLE CHEST - 1 VIEW SAME DAY  Comparison: Portable exam 0628 hours compared to 02/10/2012  Findings: Enlargement of cardiac silhouette with pulmonary vascular congestion. Mediastinal contours stable with mild elongation of the thoracic aorta noted. No acute failure or consolidation. Bones unremarkable. No pleural effusion or pneumothorax.  IMPRESSION: Enlargement of cardiac silhouette with pulmonary vascular congestion. No acute abnormalities.   Original Report Authenticated By: Ulyses Southward, M.D.     PHYSICAL EXAM  The patient is oriented. Her English is limited. She's  not having any chest pain or shortness of breath. Lungs are clear. There is no jugular venous distention. Cardiac exam reveals S1 and S2. There are no clicks or significant murmurs. The abdomen is soft. There is no significant peripheral edema.   TELEMETRY:  I have reviewed telemetry today February 11, 2012. There is sinus rhythm   ASSESSMENT AND PLAN:   Dyspnea    Her shortness of breath is improved. It appears that the diagnosis is acute congestive heart failure. I do not have echo data yet, so I cannot be sure if this is to be called systolic or diastolic. The patient and her husband want to leave the hospital today. I feel it is safe for her to go and we  will plan early post hospital followup. We will copy her outside records and try to obtain more complete records. In addition she will need a primary care physician. She  and her husband moved here from the [facility] Approximately 6 months ago.   Precordial pain    Her chest discomfort yesterday was probably from mild CHF. Troponins are normal. Her workup can be completed as an outpatient. She needs an echo and a pharmacologic stress test.   CAD (coronary artery disease)    There is a history of coronary disease with stenting in the past. We will try to obtain more information.   Anemia   She will need further outpatient workup of her anemia.      Diabetes mellitus     She is receiving treatment for her diabetes.   Dyslipidemia    She is receiving treatment for her lipids.  Willa Rough 02/11/2012 9:47 AM

## 2012-02-11 NOTE — Progress Notes (Signed)
  Echocardiogram 2D Echocardiogram has been performed.  Cathie Beams 02/11/2012, 2:45 PM

## 2012-02-11 NOTE — Progress Notes (Signed)
Pt provided with dc instructions and education. Pt verbalized understanding. Pt provided with prescriptions for medications and verbalized how to take all medications. VSS. Denies CP/SOB. IV removed with tip intact. Heart monitor cleaned and returned to front. Pt leaving with family for home. Levonne Spiller, RN

## 2012-02-25 ENCOUNTER — Encounter (HOSPITAL_COMMUNITY): Payer: No Typology Code available for payment source

## 2012-02-27 ENCOUNTER — Ambulatory Visit (HOSPITAL_COMMUNITY): Payer: No Typology Code available for payment source | Attending: Cardiology | Admitting: Radiology

## 2012-02-27 VITALS — BP 120/66 | Ht 60.0 in | Wt 186.0 lb

## 2012-02-27 DIAGNOSIS — R079 Chest pain, unspecified: Secondary | ICD-10-CM | POA: Insufficient documentation

## 2012-02-27 DIAGNOSIS — R0602 Shortness of breath: Secondary | ICD-10-CM

## 2012-02-27 DIAGNOSIS — R0989 Other specified symptoms and signs involving the circulatory and respiratory systems: Secondary | ICD-10-CM | POA: Insufficient documentation

## 2012-02-27 DIAGNOSIS — E119 Type 2 diabetes mellitus without complications: Secondary | ICD-10-CM | POA: Insufficient documentation

## 2012-02-27 DIAGNOSIS — I2510 Atherosclerotic heart disease of native coronary artery without angina pectoris: Secondary | ICD-10-CM

## 2012-02-27 DIAGNOSIS — I10 Essential (primary) hypertension: Secondary | ICD-10-CM | POA: Insufficient documentation

## 2012-02-27 DIAGNOSIS — I509 Heart failure, unspecified: Secondary | ICD-10-CM

## 2012-02-27 DIAGNOSIS — R0609 Other forms of dyspnea: Secondary | ICD-10-CM | POA: Insufficient documentation

## 2012-02-27 DIAGNOSIS — R42 Dizziness and giddiness: Secondary | ICD-10-CM | POA: Insufficient documentation

## 2012-02-27 MED ORDER — TECHNETIUM TC 99M SESTAMIBI GENERIC - CARDIOLITE
11.0000 | Freq: Once | INTRAVENOUS | Status: AC | PRN
  Administered 2012-02-27: 11 via INTRAVENOUS

## 2012-02-27 MED ORDER — TECHNETIUM TC 99M SESTAMIBI GENERIC - CARDIOLITE
33.0000 | Freq: Once | INTRAVENOUS | Status: AC | PRN
  Administered 2012-02-27: 33 via INTRAVENOUS

## 2012-02-27 MED ORDER — REGADENOSON 0.4 MG/5ML IV SOLN
0.4000 mg | Freq: Once | INTRAVENOUS | Status: AC
  Administered 2012-02-27: 0.4 mg via INTRAVENOUS

## 2012-02-27 NOTE — Progress Notes (Signed)
[facility] SITE 3 NUCLEAR MED [address] [place], [place] [address] [phone]    Cardiology Nuclear Med Study  Kathleen Holt is a 57 y.o. female     MRN : [id number]     DOB: 11-22-1955  Procedure Date: 02/27/2012  Nuclear Med Background Indication for Stress Test:  Evaluation for Ischemia, PTCA/Stent Patency and 02/10/12 Post Hospital: Chest Pain, (-) enzymes/mild acute CHF History: :CHF,'11 myocardial infarction> Cath> Stents ([place], [place]),and 02-11-12 Echo: EF=45-50% Cardiac Risk Factors: Family History - CAD, Hypertension, IDDM Type 2 and Lipids  Symptoms: Chest Pain with/without exertion (last occurrence 2 days ago),  Dizziness, DOE, Fatigue, Fatigue with Exertion, Light-Headedness, Near Syncope and SOB   Nuclear Pre-Procedure Caffeine/Decaff Intake:  None NPO After: 8:00pm   Lungs:  clear O2 Sat: 98% on room air. IV 0.9% NS with Angio Cath:  22g  IV Site: L Hand  IV Started by:  Stanton Kidney, EMT-P  Chest Size (in):  40 Cup Size: C  Height: 5' (1.524 m)  Weight:  186 lb (84.369 kg)  BMI:  Body mass index is 36.33 kg/(m^2). Tech Comments:  Counselling psychologist Med Study 1 or 2 day study: 1 day  Stress Test Type:  Lexiscan  Reading MD: Olga Millers, MD  Order Authorizing Provider:  Willa Rough, MD  Resting Radionuclide: Technetium 69m Sestamibi  Resting Radionuclide Dose: 11.0 mCi   Stress Radionuclide:  Technetium 8m Sestamibi  Stress Radionuclide Dose: 33.0 mCi           Stress Protocol Rest HR: 67 Stress HR: 100  Rest BP: 120/66 Stress BP: 132/74  Exercise Time (min): n/a METS: n/a   Predicted Max HR: 162 bpm % Max HR: 61.73 bpm Rate Pressure Product: 46962    Dose of Adenosine (mg):  n/a Dose of Lexiscan: 0.4 mg  Dose of Atropine (mg): n/a Dose of Dobutamine: n/a mcg/kg/min (at max HR)  Stress Test Technologist: Irean Hong, RN  Nuclear Technologist:  Domenic Polite, CNMT     Rest Procedure:  Myocardial perfusion imaging  was performed at rest 45 minutes following the intravenous administration of Technetium 2m Sestamibi. Rest ECG: NSR with nonspecific ST changes.  Stress Procedure:  The patient received IV Lexiscan 0.4 mg over 15-seconds. Technetium 7m Sestamibi injected at 30-seconds.The patient had no symptoms with lexiscan.  Quantitative spect images were obtained after a 45 minute delay. Stress ECG: No significant ST segment change suggestive of ischemia.  QPS Raw Data Images:  Acquisition technically good; normal left ventricular size. Stress Images:  There is decreased uptake in the lateral wall. Rest Images:  There is decreased uptake in the lateral wall. Subtraction (SDS):  There is a fixed defect that is most consistent with a previous infarction. Transient Ischemic Dilatation (Normal <1.22):  0.95 Lung/Heart Ratio (Normal <0.45):  0.40  Quantitative Gated Spect Images QGS EDV:  93 ml QGS ESV:  50 ml  Impression Exercise Capacity:  Lexiscan with no exercise. BP Response:  Normal blood pressure response. Clinical Symptoms:  No chest pain or dyspnea. ECG Impression:  No significant ST segment change suggestive of ischemia. Comparison with Prior Nuclear Study: No previous nuclear study performed  Overall Impression:  Intermediate stress nuclear study with a large, severe, fixed lateral defect consistent with prior infarct; no ischemia.  LV Ejection Fraction: 46%.  LV Wall Motion:  Lateral akinesis.  Olga Millers

## 2012-03-02 ENCOUNTER — Ambulatory Visit: Payer: No Typology Code available for payment source | Admitting: Cardiology

## 2012-03-13 ENCOUNTER — Ambulatory Visit: Payer: No Typology Code available for payment source | Admitting: Cardiology

## 2012-03-14 ENCOUNTER — Encounter (HOSPITAL_COMMUNITY): Payer: Self-pay | Admitting: *Deleted

## 2012-03-14 ENCOUNTER — Emergency Department (INDEPENDENT_AMBULATORY_CARE_PROVIDER_SITE_OTHER)
Admission: EM | Admit: 2012-03-14 | Discharge: 2012-03-14 | Disposition: A | Discharge status: Home / Self Care | Payer: No Typology Code available for payment source | Source: Home / Self Care | Attending: Family Medicine | Admitting: Family Medicine

## 2012-03-14 DIAGNOSIS — E119 Type 2 diabetes mellitus without complications: Secondary | ICD-10-CM

## 2012-03-14 MED ORDER — GABAPENTIN 300 MG PO CAPS: 300.0000 mg | ORAL_CAPSULE | Freq: Three times a day (TID) | ORAL | Status: DC

## 2012-03-14 MED ORDER — INSULIN GLARGINE 100 UNIT/ML ~~LOC~~ SOLN: 35.0000 [IU] | Freq: Every day | SUBCUTANEOUS | Status: DC

## 2012-03-14 MED ORDER — INSULIN ASPART 100 UNIT/ML ~~LOC~~ SOLN: 10.0000 [IU] | Freq: Three times a day (TID) | SUBCUTANEOUS | Status: DC

## 2012-03-14 NOTE — ED Provider Notes (Signed)
History     CSN: [id number]  Arrival date & time 03/14/12  1127   First MD Initiated Contact with Patient 03/14/12 1136      Chief Complaint  Patient presents with  . Medication Refill    (Consider location/radiation/quality/duration/timing/severity/associated sxs/prior treatment) Patient is a 57 y.o. female presenting with diabetes problem. The history is provided by the patient and the spouse.  Diabetes She presents for her follow-up (needs meds, recent hosp, no med f/u arranged except for cardiology.) diabetic visit. She has type 2 diabetes mellitus. Her disease course has been stable.    Past Medical History  Diagnosis Date  . Diabetes mellitus without complication   . Coronary artery disease     a. s/p stenting 11/2009 [facility]  . Hyperlipidemia   . Diabetic neuropathy     History reviewed. No pertinent past surgical history.  Family History  Problem Relation Age of Onset  . CAD Father     MI at age 101  . Diabetes Father   . Hypertension Father   . CAD Brother     History  Substance Use Topics  . Smoking status: Never Smoker   . Smokeless tobacco: Not on file  . Alcohol Use: No    OB History   Grav Para Term Preterm Abortions TAB SAB Ect Mult Living                  Review of Systems  Constitutional: Negative.     Allergies  Review of patient's allergies indicates no known allergies.  Home Medications   Current Outpatient Rx  Name  Route  Sig  Dispense  Refill  . aspirin 81 MG tablet   Oral   Take 4 tablets (325 mg total) by mouth daily.         Marland Kitchen atorvastatin (LIPITOR) 40 MG tablet   Oral   Take 40 mg by mouth daily.         . carvedilol (COREG) 3.125 MG tablet   Oral   Take 3.125 mg by mouth 2 (two) times daily with a meal.         . clopidogrel (PLAVIX) 75 MG tablet   Oral   Take 75 mg by mouth daily.         Marland Kitchen gabapentin (NEURONTIN) 300 MG capsule   Oral   Take 300 mg by mouth 3 (three)  times daily.         Marland Kitchen gabapentin (NEURONTIN) 300 MG capsule   Oral   Take 1 capsule (300 mg total) by mouth 3 (three) times daily.   90 capsule   1   . insulin aspart (NOVOLOG) 100 UNIT/ML injection   Subcutaneous   Inject 10 Units into the skin 3 (three) times daily before meals.          . insulin aspart (NOVOLOG) 100 UNIT/ML injection   Subcutaneous   Inject 10 Units into the skin 3 (three) times daily before meals.   1 vial   12   . insulin glargine (LANTUS) 100 UNIT/ML injection   Subcutaneous   Inject 35 Units into the skin at bedtime.          . insulin glargine (LANTUS) 100 UNIT/ML injection   Subcutaneous   Inject 35 Units into the skin at bedtime.   10 mL   1   . lisinopril (PRINIVIL,ZESTRIL) 2.5 MG tablet   Oral   Take 2.5 mg by mouth daily.         Marland Kitchen  nitroGLYCERIN (NITROSTAT) 0.4 MG SL tablet   Sublingual   Place 1 tablet (0.4 mg total) under the tongue every 5 (five) minutes x 3 doses as needed for chest pain.   25 tablet   3     BP 129/63  Pulse 76  Temp(Src) 98.1 F (36.7 C) (Oral)  Resp 17  SpO2 98%  Physical Exam  Nursing note and vitals reviewed. Constitutional: She is oriented to person, place, and time. She appears well-developed and well-nourished.  Neck: Normal range of motion.  Cardiovascular: Regular rhythm and normal heart sounds.   Pulmonary/Chest: Breath sounds normal.  Lymphadenopathy:    She has no cervical adenopathy.  Neurological: She is alert and oriented to person, place, and time.  Skin: Skin is warm and dry.  Psychiatric: She has a normal mood and affect.    ED Course  Procedures (including critical care time)  Labs Reviewed - No data to display No results found.   1. Diabetes mellitus       MDM          Linna Hoff, MD 03/14/12 1248

## 2012-03-14 NOTE — ED Notes (Signed)
Pt  Here  For  Refill of  Her    lantus  And  neurontin

## 2012-03-31 ENCOUNTER — Other Ambulatory Visit: Payer: Self-pay | Admitting: Cardiology

## 2012-03-31 DIAGNOSIS — Z1231 Encounter for screening mammogram for malignant neoplasm of breast: Secondary | ICD-10-CM

## 2012-04-10 ENCOUNTER — Ambulatory Visit (HOSPITAL_COMMUNITY)
Admission: RE | Admit: 2012-04-10 | Discharge: 2012-04-10 | Disposition: A | Discharge status: Home / Self Care | Payer: No Typology Code available for payment source | Source: Ambulatory Visit | Attending: Cardiology | Admitting: Cardiology

## 2012-04-10 DIAGNOSIS — Z1231 Encounter for screening mammogram for malignant neoplasm of breast: Secondary | ICD-10-CM | POA: Insufficient documentation

## 2012-04-14 ENCOUNTER — Encounter (HOSPITAL_COMMUNITY): Payer: Self-pay

## 2012-04-14 ENCOUNTER — Emergency Department (HOSPITAL_COMMUNITY)
Admission: EM | Admit: 2012-04-14 | Discharge: 2012-04-14 | Disposition: A | Discharge status: Home / Self Care | Payer: No Typology Code available for payment source | Source: Home / Self Care | Attending: Family Medicine | Admitting: Family Medicine

## 2012-04-14 DIAGNOSIS — E1142 Type 2 diabetes mellitus with diabetic polyneuropathy: Secondary | ICD-10-CM | POA: Insufficient documentation

## 2012-04-14 DIAGNOSIS — R0989 Other specified symptoms and signs involving the circulatory and respiratory systems: Secondary | ICD-10-CM

## 2012-04-14 DIAGNOSIS — E118 Type 2 diabetes mellitus with unspecified complications: Secondary | ICD-10-CM

## 2012-04-14 DIAGNOSIS — E119 Type 2 diabetes mellitus without complications: Secondary | ICD-10-CM

## 2012-04-14 DIAGNOSIS — I059 Rheumatic mitral valve disease, unspecified: Secondary | ICD-10-CM

## 2012-04-14 DIAGNOSIS — K5900 Constipation, unspecified: Secondary | ICD-10-CM

## 2012-04-14 DIAGNOSIS — E11319 Type 2 diabetes mellitus with unspecified diabetic retinopathy without macular edema: Secondary | ICD-10-CM

## 2012-04-14 DIAGNOSIS — D649 Anemia, unspecified: Secondary | ICD-10-CM

## 2012-04-14 DIAGNOSIS — I509 Heart failure, unspecified: Secondary | ICD-10-CM

## 2012-04-14 DIAGNOSIS — I2510 Atherosclerotic heart disease of native coronary artery without angina pectoris: Secondary | ICD-10-CM

## 2012-04-14 LAB — COMPREHENSIVE METABOLIC PANEL
ALT: 20 U/L (ref 0–35)
AST: 18 U/L (ref 0–37)
Calcium: 9.3 mg/dL (ref 8.4–10.5)
GFR calc Af Amer: >90 mL/min (ref >90)
Glucose, Bld: 191 mg/dL — ABNORMAL HIGH (ref 70–99)
Sodium: 138 mEq/L (ref 135–145)
Total Protein: 8.3 g/dL (ref 6.0–8.3)

## 2012-04-14 LAB — HEMOGLOBIN A1C: Hgb A1c MFr Bld: 10.5 % — ABNORMAL HIGH (ref <5.7)

## 2012-04-14 LAB — LIPID PANEL
HDL: 43 mg/dL (ref >39)
Total CHOL/HDL Ratio: 2.8 RATIO

## 2012-04-14 MED ORDER — GABAPENTIN 300 MG PO CAPS: 600.0000 mg | ORAL_CAPSULE | Freq: Three times a day (TID) | ORAL | Status: DC

## 2012-04-14 MED ORDER — ROSUVASTATIN CALCIUM 20 MG PO TABS: 20.0000 mg | ORAL_TABLET | Freq: Every day | ORAL | Status: DC

## 2012-04-14 MED ORDER — INSULIN ASPART 100 UNIT/ML ~~LOC~~ SOLN: 10.0000 [IU] | Freq: Three times a day (TID) | SUBCUTANEOUS | Status: DC

## 2012-04-14 MED ORDER — CLOPIDOGREL BISULFATE 75 MG PO TABS: 75.0000 mg | ORAL_TABLET | Freq: Every day | ORAL | Status: DC

## 2012-04-14 MED ORDER — CARVEDILOL 3.125 MG PO TABS: 3.1250 mg | ORAL_TABLET | Freq: Two times a day (BID) | ORAL | Status: DC

## 2012-04-14 MED ORDER — NITROGLYCERIN 0.4 MG SL SUBL: 0.4000 mg | SUBLINGUAL_TABLET | SUBLINGUAL | Status: DC | PRN

## 2012-04-14 MED ORDER — GABAPENTIN 300 MG PO CAPS: 300.0000 mg | ORAL_CAPSULE | Freq: Three times a day (TID) | ORAL | Status: DC

## 2012-04-14 MED ORDER — INSULIN GLARGINE 100 UNIT/ML ~~LOC~~ SOLN: 35.0000 [IU] | Freq: Every day | SUBCUTANEOUS | Status: DC

## 2012-04-14 MED ORDER — LISINOPRIL 2.5 MG PO TABS: 2.5000 mg | ORAL_TABLET | Freq: Every day | ORAL | Status: DC

## 2012-04-14 NOTE — ED Provider Notes (Signed)
History   CSN: [id number]  Arrival date & time 04/14/12  1028   First MD Initiated Contact with Patient 04/14/12 1047     Chief Complaint  Patient presents with  . Establish Care   The history is provided by the patient. The history is limited by a language barrier. A language interpreter was used.   Pt is reporting BS controlled on basal bolus insulin with BS 120-170, she denies having hypoglycemia.  Pt says that she is taking her meds.  She has a discount card and has her medications.  Pt has extensive medical history including diabetic retinopathy, neuropathy, and has cad with 3 stents placed.  Pt says that she feels well and has followed up with Dr. Myrtis Ser for cardiology care.  She reports that she has her medications at this time and she reports that she is not having hypoglycemia.   Past Medical History  Diagnosis Date  . Diabetes mellitus without complication   . Coronary artery disease     a. s/p stenting 11/2009 [facility]  . Hyperlipidemia   . Diabetic neuropathy     History reviewed. No pertinent past surgical history.  Family History  Problem Relation Age of Onset  . CAD Father     MI at age 60  . Diabetes Father   . Hypertension Father   . CAD Brother     History  Substance Use Topics  . Smoking status: Never Smoker   . Smokeless tobacco: Not on file  . Alcohol Use: No    OB History   Grav Para Term Preterm Abortions TAB SAB Ect Mult Living                 Review of Systems  Constitutional: Negative.   All other systems reviewed and are negative.    Allergies  Review of patient's allergies indicates no known allergies.  Home Medications   Current Outpatient Rx  Name  Route  Sig  Dispense  Refill  . aspirin 81 MG tablet   Oral   Take 4 tablets (325 mg total) by mouth daily.         Marland Kitchen atorvastatin (LIPITOR) 40 MG tablet   Oral   Take 40 mg by mouth daily.         . carvedilol (COREG) 3.125 MG tablet   Oral  Take 3.125 mg by mouth 2 (two) times daily with a meal.         . clopidogrel (PLAVIX) 75 MG tablet   Oral   Take 75 mg by mouth daily.         Marland Kitchen gabapentin (NEURONTIN) 300 MG capsule   Oral   Take 300 mg by mouth 3 (three) times daily.         Marland Kitchen gabapentin (NEURONTIN) 300 MG capsule   Oral   Take 1 capsule (300 mg total) by mouth 3 (three) times daily.   90 capsule   1   . insulin aspart (NOVOLOG) 100 UNIT/ML injection   Subcutaneous   Inject 10 Units into the skin 3 (three) times daily before meals.          . insulin aspart (NOVOLOG) 100 UNIT/ML injection   Subcutaneous   Inject 10 Units into the skin 3 (three) times daily before meals.   1 vial   12   . insulin glargine (LANTUS) 100 UNIT/ML injection   Subcutaneous   Inject 35 Units into the skin at bedtime.          Marland Kitchen  insulin glargine (LANTUS) 100 UNIT/ML injection   Subcutaneous   Inject 35 Units into the skin at bedtime.   10 mL   1   . lisinopril (PRINIVIL,ZESTRIL) 2.5 MG tablet   Oral   Take 2.5 mg by mouth daily.         . nitroGLYCERIN (NITROSTAT) 0.4 MG SL tablet   Sublingual   Place 1 tablet (0.4 mg total) under the tongue every 5 (five) minutes x 3 doses as needed for chest pain.   25 tablet   3     BP 144/71  Pulse 71  Temp(Src) 97.3 F (36.3 C) (Oral)  SpO2 100%  Physical Exam  Nursing note and vitals reviewed. Constitutional: She is oriented to person, place, and time. She appears well-developed and well-nourished. No distress.  HENT:  Head: Normocephalic and atraumatic.  Mouth/Throat: Oropharynx is clear and moist.  Eyes: Conjunctivae and EOM are normal. Pupils are equal, round, and reactive to light.  Neck: Normal range of motion. Neck supple.  Cardiovascular: Normal rate, regular rhythm and normal heart sounds.   Pulmonary/Chest: Effort normal and breath sounds normal. No respiratory distress.  Abdominal: Soft. Bowel sounds are normal. She exhibits no distension and no  mass. There is no tenderness. There is no rebound and no guarding.  Musculoskeletal: Normal range of motion. She exhibits no edema and no tenderness.  Neurological: She is alert and oriented to person, place, and time.  Skin: Skin is warm and dry. No erythema. No pallor.  Psychiatric: She has a normal mood and affect. Her behavior is normal. Judgment and thought content normal.    ED Course  Procedures (including critical care time)  Labs Reviewed  COMPREHENSIVE METABOLIC PANEL  LIPID PANEL  HEMOGLOBIN A1C  TSH   No results found.  No diagnosis found.  MDM  IMPRESSION  CAD  HTN  DM type 2, insulin requiring with neuropathy and retinopathy  Diabetic Dyslipidemia  CHF  RECOMMENDATIONS / PLAN Continue basal bolus insulin:  Lantus 35 units, novolog 10 units TIDAC  Check kidney function and consider adding metformin later if appropriate Hypoglycemia precautions reviewed with patient today and she verbalized understanding Check labs today Check A1c  FOLLOW UP 1 month  The patient was given clear instructions to go to ER or return to medical center if symptoms don't improve, worsen or new problems develop.  The patient verbalized understanding.  The patient was told to call to get lab results if they haven't heard anything in the next week.    Results for orders placed during the hospital encounter of 04/14/12  COMPREHENSIVE METABOLIC PANEL      Result Value Range   Sodium 138  135 - 145 mEq/L   Potassium 4.6  3.5 - 5.1 mEq/L   Chloride 101  96 - 112 mEq/L   CO2 28  19 - 32 mEq/L   Glucose, Bld 191 (*) 70 - 99 mg/dL   BUN 10  6 - 23 mg/dL   Creatinine, Ser 0.98  0.50 - 1.10 mg/dL   Calcium 9.3  8.4 - 11.9 mg/dL   Total Protein 8.3  6.0 - 8.3 g/dL   Albumin 3.8  3.5 - 5.2 g/dL   AST 18  0 - 37 U/L   ALT 20  0 - 35 U/L   Alkaline Phosphatase 95  39 - 117 U/L   Total Bilirubin 0.3  0.3 - 1.2 mg/dL   GFR calc non Af Amer 79 (*) >90 mL/min  GFR calc Af Amer >90   >90 mL/min  LIPID PANEL      Result Value Range   Cholesterol 122  0 - 200 mg/dL   Triglycerides 90  <161 mg/dL   HDL 43  >09 mg/dL   Total CHOL/HDL Ratio 2.8     VLDL 18  0 - 40 mg/dL   LDL Cholesterol 61  0 - 99 mg/dL  HEMOGLOBIN U0A      Result Value Range   Hemoglobin A1C 10.5 (*) <5.7 %   Mean Plasma Glucose 255 (*) <117 mg/dL  TSH      Result Value Range   TSH 2.549  0.350 - 4.500 uIU/mL  GLUCOSE, CAPILLARY      Result Value Range   Glucose-Capillary 171 (*) 70 - 99 mg/dL           Cleora Fleet, MD 04/14/12 2037

## 2012-04-14 NOTE — ED Notes (Signed)
Patient here to establish a primary doctor 

## 2012-04-15 ENCOUNTER — Telehealth (HOSPITAL_COMMUNITY): Payer: Self-pay

## 2012-04-15 NOTE — Progress Notes (Signed)
Quick Note:  Please inform patient that her diabetes is out of control as evidenced by an A1c of 10.5%. Please continue basal bolus insulin (4 shots per day), Please check blood glucose 4 times per day and bring readings to next office visit. Please follow up in office in 1 month as planned.   Rodney Langton, MD, CDE, FAAFP [facility] [place], [place]   ______

## 2012-04-17 ENCOUNTER — Telehealth (HOSPITAL_COMMUNITY): Payer: Self-pay

## 2012-04-17 NOTE — ED Notes (Signed)
Lab results given Needs to continue her insulin shots 4 times per day Check blood sugars 4 times per day and bring in the readings

## 2012-04-18 ENCOUNTER — Encounter: Payer: Self-pay | Admitting: Cardiology

## 2012-04-18 DIAGNOSIS — I340 Nonrheumatic mitral (valve) insufficiency: Secondary | ICD-10-CM | POA: Insufficient documentation

## 2012-04-18 DIAGNOSIS — I2510 Atherosclerotic heart disease of native coronary artery without angina pectoris: Secondary | ICD-10-CM | POA: Insufficient documentation

## 2012-04-18 DIAGNOSIS — R9430 Abnormal result of cardiovascular function study, unspecified: Secondary | ICD-10-CM | POA: Insufficient documentation

## 2012-04-20 ENCOUNTER — Encounter: Payer: Self-pay | Admitting: Cardiology

## 2012-04-20 ENCOUNTER — Ambulatory Visit (INDEPENDENT_AMBULATORY_CARE_PROVIDER_SITE_OTHER): Payer: No Typology Code available for payment source | Admitting: Cardiology

## 2012-04-20 VITALS — BP 126/70 | HR 70 | Ht <= 58 in | Wt 187.0 lb

## 2012-04-20 DIAGNOSIS — R9430 Abnormal result of cardiovascular function study, unspecified: Secondary | ICD-10-CM

## 2012-04-20 DIAGNOSIS — I059 Rheumatic mitral valve disease, unspecified: Secondary | ICD-10-CM

## 2012-04-20 DIAGNOSIS — E1142 Type 2 diabetes mellitus with diabetic polyneuropathy: Secondary | ICD-10-CM

## 2012-04-20 DIAGNOSIS — K5900 Constipation, unspecified: Secondary | ICD-10-CM | POA: Insufficient documentation

## 2012-04-20 DIAGNOSIS — R0989 Other specified symptoms and signs involving the circulatory and respiratory systems: Secondary | ICD-10-CM

## 2012-04-20 DIAGNOSIS — I2510 Atherosclerotic heart disease of native coronary artery without angina pectoris: Secondary | ICD-10-CM

## 2012-04-20 DIAGNOSIS — I340 Nonrheumatic mitral (valve) insufficiency: Secondary | ICD-10-CM

## 2012-04-20 NOTE — Progress Notes (Signed)
HPI   The patient is seen in followup for cardiac status after a hospitalization in January, 2014. The patient was new to [place] at that time. In the hospital her echo showed only mild left ventricular dysfunction. She stabilized and it was felt that she could go home for followup outpatient nuclear scan. This scan was done and showed no significant ischemia. She's not having chest pain.  Review of the records reveal that the patient has had a few trips to the emergency department for adjustment of her diabetic medicines.  As part of today's evaluation I have carefully reviewed all of her hospital records and updated this medical record.  No Known Allergies  Current Outpatient Prescriptions  Medication Sig Dispense Refill  . aspirin 81 MG tablet Take 4 tablets (325 mg total) by mouth daily.      . carvedilol (COREG) 3.125 MG tablet Take 1 tablet (3.125 mg total) by mouth 2 (two) times daily with a meal.  60 tablet  3  . clopidogrel (PLAVIX) 75 MG tablet Take 1 tablet (75 mg total) by mouth daily.  30 tablet  3  . gabapentin (NEURONTIN) 300 MG capsule Take 2 capsules (600 mg total) by mouth 3 (three) times daily.  180 capsule  3  . insulin aspart (NOVOLOG) 100 UNIT/ML injection Inject 10 Units into the skin 3 (three) times daily before meals.  1 vial  3  . insulin glargine (LANTUS) 100 UNIT/ML injection Inject 0.35 mLs (35 Units total) into the skin at bedtime.  10 mL  3  . lisinopril (PRINIVIL,ZESTRIL) 2.5 MG tablet Take 1 tablet (2.5 mg total) by mouth daily.  30 tablet  3  . nitroGLYCERIN (NITROSTAT) 0.4 MG SL tablet Place 1 tablet (0.4 mg total) under the tongue every 5 (five) minutes x 3 doses as needed for chest pain.  25 tablet  3  . rosuvastatin (CRESTOR) 20 MG tablet Take 1 tablet (20 mg total) by mouth daily.  30 tablet  3   No current facility-administered medications for this visit.    History   Social History  . Marital Status: Married    Spouse Name: N/A    Number of  Children: 3  . Years of Education: N/A   Occupational History  . Not on file.   Social History Main Topics  . Smoking status: Never Smoker   . Smokeless tobacco: Not on file  . Alcohol Use: No  . Drug Use: No  . Sexually Active: Yes    Birth Control/ Protection: Post-menopausal   Other Topics Concern  . Not on file   Social History Narrative  . No narrative on file    Family History  Problem Relation Age of Onset  . CAD Father     MI at age 63  . Diabetes Father   . Hypertension Father   . CAD Brother     Past Medical History  Diagnosis Date  . Diabetes mellitus without complication   . Coronary artery disease     a. s/p stenting 11/2009 [facility]  . Hyperlipidemia   . Diabetic neuropathy   . Ejection fraction      EF 45-50%,  echo, February 10, 2012, akinesis posterior lateral wall, diastolic dysfunction, mild mitral regurgitation,  . Mitral regurgitation     Mild, echo, January, 2014    History reviewed. No pertinent past surgical history.  Patient Active Problem List  Diagnosis  . Anemia  . Dyslipidemia  . Diabetic  retinopathy  . Polyneuropathy in diabetes(357.2)  . Coronary artery disease  . Ejection fraction  . Mitral regurgitation    ROS   There is an interpreter present. The patient denies fever, chills, headache, sweats, rash, change in vision, change in hearing, chest pain, cough, nausea vomiting, urinary symptoms. She does have some tingling in her feet that is probably from her diabetes.  PHYSICAL EXAM  The patient is oriented to person time and place. The communication was with her husband and an interpreter. Lungs are clear. Respiratory effort is nonlabored. The patient is overweight. Cardiac exam reveals S1 and S2. There no clicks or significant murmurs. The abdomen is soft. There is no peripheral edema. There no musculoskeletal deformities. There no skin rashes.  Filed Vitals:   04/20/12 0940  BP: 126/70    Pulse: 70  Height: 4\' 9"  (1.448 m)  Weight: 187 lb (84.823 kg)     ASSESSMENT & PLAN

## 2012-04-20 NOTE — Assessment & Plan Note (Signed)
The patient has ongoing care for diabetes.

## 2012-04-20 NOTE — Assessment & Plan Note (Signed)
The patient complained of being constipated. I recommended Metamucil.

## 2012-04-20 NOTE — Assessment & Plan Note (Signed)
Patient has mild reduction in her ejection fraction. There is mild mitral regurgitation. She is not currently volume overloaded. I had a discussion with her through the interpreter to have her continue to be careful with her salt and fluid intake.

## 2012-04-20 NOTE — Assessment & Plan Note (Addendum)
The patient has known coronary disease. She had a stent placed in November, 2011 in [place]. She was hospitalized in January, 2014. Her echo revealed lateral hypokinesis. She had an outpatient nuclear scan which showed a large fixed lateral defect. There is no ischemia. Ejection fraction was 46%. The study was done February 27, 2012. She's not having any symptoms. She stable. No further workup.  As part of today's evaluation I reviewed extensively her hospital records. I spent greater than 25 minutes talking with her through her interpreter. Total time was greater than 25 minutes and more than half of this was with direct contact with the patient.

## 2012-04-20 NOTE — Assessment & Plan Note (Signed)
There is mild mitral regurgitation. No change in therapy.

## 2012-04-20 NOTE — Patient Instructions (Addendum)
Your physician wants you to follow-up in: 6 months.   You will receive a reminder letter in the mail two months in advance. If you don't receive a letter, please call our office to schedule the follow-up appointment.  Your physician has recommended you make the following change in your medication: Dr Myrtis Ser recommends that you take Metamucil or a generic for metamucil for your constipation.

## 2012-06-04 ENCOUNTER — Ambulatory Visit: Payer: No Typology Code available for payment source | Attending: Family Medicine | Admitting: Family Medicine

## 2012-06-04 ENCOUNTER — Encounter: Payer: Self-pay | Admitting: Family Medicine

## 2012-06-04 VITALS — BP 135/79 | HR 70 | Temp 98.4°F | Ht 59.84 in | Wt 181.0 lb

## 2012-06-04 DIAGNOSIS — K5900 Constipation, unspecified: Secondary | ICD-10-CM

## 2012-06-04 DIAGNOSIS — H538 Other visual disturbances: Secondary | ICD-10-CM

## 2012-06-04 DIAGNOSIS — E1139 Type 2 diabetes mellitus with other diabetic ophthalmic complication: Secondary | ICD-10-CM | POA: Insufficient documentation

## 2012-06-04 DIAGNOSIS — E11319 Type 2 diabetes mellitus with unspecified diabetic retinopathy without macular edema: Secondary | ICD-10-CM | POA: Insufficient documentation

## 2012-06-04 DIAGNOSIS — E1140 Type 2 diabetes mellitus with diabetic neuropathy, unspecified: Secondary | ICD-10-CM

## 2012-06-04 DIAGNOSIS — E1149 Type 2 diabetes mellitus with other diabetic neurological complication: Secondary | ICD-10-CM

## 2012-06-04 DIAGNOSIS — H539 Unspecified visual disturbance: Secondary | ICD-10-CM

## 2012-06-04 DIAGNOSIS — H53139 Sudden visual loss, unspecified eye: Secondary | ICD-10-CM

## 2012-06-04 DIAGNOSIS — E1142 Type 2 diabetes mellitus with diabetic polyneuropathy: Secondary | ICD-10-CM

## 2012-06-04 DIAGNOSIS — IMO0001 Reserved for inherently not codable concepts without codable children: Secondary | ICD-10-CM

## 2012-06-04 DIAGNOSIS — H53131 Sudden visual loss, right eye: Secondary | ICD-10-CM

## 2012-06-04 MED ORDER — GABAPENTIN 400 MG PO CAPS: 800.0000 mg | ORAL_CAPSULE | Freq: Three times a day (TID) | ORAL | Status: DC

## 2012-06-04 NOTE — Patient Instructions (Signed)
Go to see opthalmologist as soon as possible   Diabetic Retinopathy Having diabetes for a long time, especially if it is not controlled, can damage the light-sensitive membrane at the back of the eye (retina). The disease of the retina caused by diabetes is called diabetic retinopathy. Taking good care of your diabetes helps reduce the risk of developing diabetic retinopathy. Have regular eye exams. Early detection is the key to keeping your eyes healthy. Diabetes can also affect other parts of the eye with vision-threatening results, such as cataracts and a form of glaucoma that is very difficult to treat. SYMPTOMS  In the early stages of diabetic retinopathy, there are often no symptoms. As the condition advances, symptoms may include:  Blurred vision. This may go away when blood glucose (sugar) levels are normal. This type of reversible change in vision is usually due to swelling of the lens.  Moving speck or dark spots (floaters) in your vision. This can be caused by small amounts of blood (hemorrhages) escaping from the blood vessels of the retina.  Missing parts of your field of vision. This can be caused by larger hemorrhages within the tissue of the retina.  Poor night vision.  Poor color vision.  Sudden drop or loss of vision in one eye. This may be caused by a hemorrhage from retinal blood vessels into the cavity of the inside of the eye. You should not wait until you have symptoms. An eye care specialist can start treatment before visual impairment occurs. DIAGNOSIS  Your eye care specialist can detect the diabetic changes in your blood vessels by putting drops in your eyes to enlarge the size of (dilate) your pupils. This allows a bigger "window" through which the caregiver can see the entire inside of your eyes.  TREATMENT   If you have the type of diabetes that requires you to use insulin, your risk of diabetic retinopathy is very high. You should have your eyes checked at least  every 6 months.  If you have diabetes that was diagnosed during childhood or before the age of 79, your risk of diabetic retinopathy is very high. You should have your eyes checked at least every 6 months.  If you have diabetes that is controlled by diet, you should have the dilated eye exam when first diagnosed and yearly thereafter.  If your diabetes is not under good control as measured by your blood glucose levels and other indicators, it is critical that you have your eyes checked even more often. Your caregiver can usually see the problems of diabetic retinopathy developing long before it causes a problem. In many cases, it can be treated to prevent complications. HOME CARE INSTRUCTIONS   Keep blood pressure in goal range.  Keep blood glucose in target range.  Follow your caregiver's orders regarding diet and other means for controlling your blood glucose levels.  Check both your urine and blood levels for glucose as recommended by your caregiver. SEEK MEDICAL CARE IF:   You notice gradual blurring or other changes in your vision over time.  You notice that your glasses or contact lenses do not make things look as sharp as they once did.  You have trouble reading or seeing details at a distance with either eye. SEEK IMMEDIATE MEDICAL CARE IF:   You notice a sudden change in your vision or parts of your field of vision appear missing or hazy. This may mean you have lost some vision. Get help right away to prevent further vision  loss.  You suddenly see moving specks or dark spots in the field of vision of either eye.  You have a sudden partial or total loss of vision in either eye. Document Released: 01/05/2000 Document Revised: 04/01/2011 Document Reviewed: 09/28/2008 [facility] Patient Information 2013 [place], [place].

## 2012-06-06 ENCOUNTER — Encounter: Payer: Self-pay | Admitting: Family Medicine

## 2012-06-06 DIAGNOSIS — H53139 Sudden visual loss, unspecified eye: Secondary | ICD-10-CM | POA: Insufficient documentation

## 2012-06-06 DIAGNOSIS — H539 Unspecified visual disturbance: Secondary | ICD-10-CM | POA: Insufficient documentation

## 2012-06-06 DIAGNOSIS — H538 Other visual disturbances: Secondary | ICD-10-CM | POA: Insufficient documentation

## 2012-06-06 DIAGNOSIS — E1149 Type 2 diabetes mellitus with other diabetic neurological complication: Secondary | ICD-10-CM | POA: Insufficient documentation

## 2012-06-06 NOTE — Progress Notes (Signed)
CC: Chief Complaint  Patient presents with  . Blurred Vision  . Dizziness  . Diabetes  . Dental Pain   HINDI Translator was used to communicate with patient  HPI: This patient has long history of poorly controlled Type 2 DM with multiple complications of CAD, retinopathy (s/p laser eye surgery), who presents reporting 2 days of increasing blurry vision in right eye and floaters in right eye and diminished peripheral vision in the right eye.  She denies having eye pain.  She is also reporting dizziness (occasional) and reporting dental pain and requesting referral to eye physician and dentist.  The patient did follow up with cardiology.  No Known Allergies Past Medical History  Diagnosis Date  . Diabetes mellitus without complication   . Coronary artery disease     a. s/p stenting 11/2009 [facility]  . Hyperlipidemia   . Diabetic neuropathy   . Ejection fraction      EF 45-50%,  echo, February 10, 2012, akinesis posterior lateral wall, diastolic dysfunction, mild mitral regurgitation,  . Mitral regurgitation     Mild, echo, January, 2014  . Stroke    Current Outpatient Prescriptions on File Prior to Visit  Medication Sig Dispense Refill  . aspirin 81 MG tablet Take 4 tablets (325 mg total) by mouth daily.      . carvedilol (COREG) 3.125 MG tablet Take 1 tablet (3.125 mg total) by mouth 2 (two) times daily with a meal.  60 tablet  3  . clopidogrel (PLAVIX) 75 MG tablet Take 1 tablet (75 mg total) by mouth daily.  30 tablet  3  . insulin aspart (NOVOLOG) 100 UNIT/ML injection Inject 10 Units into the skin 3 (three) times daily before meals.  1 vial  3  . insulin glargine (LANTUS) 100 UNIT/ML injection Inject 0.35 mLs (35 Units total) into the skin at bedtime.  10 mL  3  . lisinopril (PRINIVIL,ZESTRIL) 2.5 MG tablet Take 1 tablet (2.5 mg total) by mouth daily.  30 tablet  3  . nitroGLYCERIN (NITROSTAT) 0.4 MG SL tablet Place 1 tablet (0.4 mg total) under the  tongue every 5 (five) minutes x 3 doses as needed for chest pain.  25 tablet  3  . rosuvastatin (CRESTOR) 20 MG tablet Take 1 tablet (20 mg total) by mouth daily.  30 tablet  3   No current facility-administered medications on file prior to visit.   Family History  Problem Relation Age of Onset  . CAD Father     MI at age 11  . Diabetes Father   . Hypertension Father   . Cancer Father   . CAD Brother    History   Social History  . Marital Status: Married    Spouse Name: N/A    Number of Children: 3  . Years of Education: N/A   Occupational History  . Not on file.   Social History Main Topics  . Smoking status: Never Smoker   . Smokeless tobacco: Not on file  . Alcohol Use: No  . Drug Use: No  . Sexually Active: Yes    Birth Control/ Protection: Post-menopausal   Other Topics Concern  . Not on file   Social History Narrative  . No narrative on file    Review of Systems  Constitutional: Negative for fever, chills, diaphoresis, activity change, appetite change and fatigue.  HENT: Negative for ear pain, nosebleeds, congestion, facial swelling, rhinorrhea, neck pain, neck stiffness and ear discharge.  Eyes:  visual disturbance in right eye as described above.  Respiratory: Negative for cough, choking, chest tightness, shortness of breath, wheezing and stridor.   Cardiovascular: Negative for chest pain, palpitations and leg swelling.  Gastrointestinal: Negative for abdominal distention.  Genitourinary: Negative for dysuria, urgency, frequency, hematuria, flank pain, decreased urine volume, difficulty urinating and dyspareunia.  Musculoskeletal: Negative for back pain, joint swelling, arthralgias and gait problem.  Neurological: Negative for dizziness, tremors, seizures, syncope, facial asymmetry, speech difficulty, weakness, light-headedness, numbness and headaches.  Hematological: Negative for adenopathy. Does not bruise/bleed easily.  Psychiatric/Behavioral: Negative  for hallucinations, behavioral problems, confusion, dysphoric mood, decreased concentration and agitation.    Objective:   Filed Vitals:   06/04/12 1636  BP: 135/79  Pulse: 70  Temp: 98.4 F (36.9 C)    Physical Exam  Constitutional: Appears well-developed and well-nourished. No distress.  HENT: Normocephalic. External right and left ear normal. Oropharynx is clear and moist.  Eyes: Conjunctivae and EOM are normal. PERRLA, no scleral icterus. attempted undilated fundoscopic exam in office but could not see much Neck: Normal ROM. Neck supple. No JVD. No tracheal deviation. No thyromegaly.  CVS: RRR, S1/S2 +, no murmurs, no gallops, no carotid bruit.  Pulmonary: Effort and breath sounds normal, no stridor, rhonchi, wheezes, rales.  Abdominal: Soft. BS +,  no distension, tenderness, rebound or guarding.  Musculoskeletal: Normal range of motion. No edema and no tenderness.  Lymphadenopathy: No lymphadenopathy noted, cervical, inguinal. Neuro: Alert. Normal reflexes, muscle tone coordination. No cranial nerve deficit. Skin: Skin is warm and dry. No rash noted. Not diaphoretic. No erythema. No pallor.  Psychiatric: Normal mood and affect. Behavior, judgment, thought content normal.   Lab Results  Component Value Date   WBC 7.2 02/11/2012   HGB 11.8* 02/11/2012   HCT 37.1 02/11/2012   MCV 70.4* 02/11/2012   PLT 306 02/11/2012   Lab Results  Component Value Date   CREATININE 0.81 04/14/2012   BUN 10 04/14/2012   NA 138 04/14/2012   K 4.6 04/14/2012   CL 101 04/14/2012   CO2 28 04/14/2012    Lab Results  Component Value Date   HGBA1C 10.5* 04/14/2012    Assessment:   Patient Active Problem List  Acute Vision Change in Right Eye Diabetic Retinopathy     Diagnosis Date Noted  . Constipation 04/20/2012  . Coronary artery disease   . Ejection fraction   . Mitral regurgitation   . Diabetic retinopathy 04/14/2012  . Polyneuropathy in diabetes(357.2) 04/14/2012  . Anemia  02/11/2012  . Dyslipidemia 02/11/2012      Plan:     I had a long discussion with the patient and her husband thru translator that I believed she should go to ER.  They declined to do so.  I explained that because of her having the [facility] discount card that there are not any opthal appointments available this month.  I explained that if they had $65 to pay upfront we could probably get her in to see an opthalmologist much sooner appointment but they said that they could not pay that amount.  I explained that pt's vision is threatened.  I explained that she could be having some bleeding from her diabetic retinopathy and / or retinal damage and that her vision could be lost permanently if she does not seek opthalmology care immediately.  They verbalized understanding.  I told her to go to ER immediately if she has any worsening of her vision.  The patient verbalized understanding.  In addition, I tried a second time to get her to go to the ER because I felt that she could be seen by an on call opthalmologist.  I asked the referral specialist to put a stat on her opthalmology referral.    The patient was given clear instructions to go to ER or return to medical center if symptoms don't improve, worsen or new problems develop.  The patient verbalized understanding.  The patient was told to call to get lab results if they haven't heard anything in the next week.    Follow up 1 month for diabetes appointment  Rodney Langton, MD, CDE, FAAFP [facility] [place], [place]

## 2012-08-20 ENCOUNTER — Encounter (HOSPITAL_COMMUNITY): Payer: Self-pay | Admitting: Emergency Medicine

## 2012-08-20 ENCOUNTER — Emergency Department (INDEPENDENT_AMBULATORY_CARE_PROVIDER_SITE_OTHER): Payer: No Typology Code available for payment source

## 2012-08-20 ENCOUNTER — Emergency Department (HOSPITAL_COMMUNITY)
Admission: EM | Admit: 2012-08-20 | Discharge: 2012-08-20 | Disposition: A | Discharge status: Home / Self Care | Payer: No Typology Code available for payment source | Source: Home / Self Care | Attending: Emergency Medicine | Admitting: Emergency Medicine

## 2012-08-20 DIAGNOSIS — E118 Type 2 diabetes mellitus with unspecified complications: Secondary | ICD-10-CM

## 2012-08-20 DIAGNOSIS — I2510 Atherosclerotic heart disease of native coronary artery without angina pectoris: Secondary | ICD-10-CM

## 2012-08-20 DIAGNOSIS — D649 Anemia, unspecified: Secondary | ICD-10-CM

## 2012-08-20 DIAGNOSIS — E11319 Type 2 diabetes mellitus with unspecified diabetic retinopathy without macular edema: Secondary | ICD-10-CM

## 2012-08-20 DIAGNOSIS — I059 Rheumatic mitral valve disease, unspecified: Secondary | ICD-10-CM

## 2012-08-20 DIAGNOSIS — R0989 Other specified symptoms and signs involving the circulatory and respiratory systems: Secondary | ICD-10-CM

## 2012-08-20 DIAGNOSIS — J209 Acute bronchitis, unspecified: Secondary | ICD-10-CM

## 2012-08-20 DIAGNOSIS — E119 Type 2 diabetes mellitus without complications: Secondary | ICD-10-CM

## 2012-08-20 DIAGNOSIS — K5900 Constipation, unspecified: Secondary | ICD-10-CM

## 2012-08-20 DIAGNOSIS — I509 Heart failure, unspecified: Secondary | ICD-10-CM

## 2012-08-20 DIAGNOSIS — E1142 Type 2 diabetes mellitus with diabetic polyneuropathy: Secondary | ICD-10-CM

## 2012-08-20 MED ORDER — LISINOPRIL 2.5 MG PO TABS: 2.5000 mg | ORAL_TABLET | Freq: Every day | ORAL | Status: DC

## 2012-08-20 MED ORDER — INSULIN ASPART 100 UNIT/ML ~~LOC~~ SOLN: 10.0000 [IU] | Freq: Three times a day (TID) | SUBCUTANEOUS | Status: DC

## 2012-08-20 MED ORDER — CLOPIDOGREL BISULFATE 75 MG PO TABS: 75.0000 mg | ORAL_TABLET | Freq: Every day | ORAL | Status: DC

## 2012-08-20 MED ORDER — ATORVASTATIN CALCIUM 40 MG PO TABS: 40.0000 mg | ORAL_TABLET | Freq: Every day | ORAL | Status: DC

## 2012-08-20 MED ORDER — INSULIN GLARGINE 100 UNIT/ML ~~LOC~~ SOLN: 35.0000 [IU] | Freq: Every day | SUBCUTANEOUS | Status: DC

## 2012-08-20 MED ORDER — ALBUTEROL SULFATE HFA 108 (90 BASE) MCG/ACT IN AERS: 1.0000 | INHALATION_SPRAY | Freq: Four times a day (QID) | RESPIRATORY_TRACT | Status: DC | PRN

## 2012-08-20 MED ORDER — GABAPENTIN 400 MG PO CAPS: 800.0000 mg | ORAL_CAPSULE | Freq: Three times a day (TID) | ORAL | Status: DC

## 2012-08-20 MED ORDER — POLYETHYL GLYCOL-PROPYL GLYCOL 0.4-0.3 % OP SOLN: OPHTHALMIC | Status: DC

## 2012-08-20 MED ORDER — BENZONATATE 200 MG PO CAPS: 200.0000 mg | ORAL_CAPSULE | Freq: Three times a day (TID) | ORAL | Status: DC | PRN

## 2012-08-20 MED ORDER — CARVEDILOL 3.125 MG PO TABS: 3.1250 mg | ORAL_TABLET | Freq: Two times a day (BID) | ORAL | Status: DC

## 2012-08-20 MED ORDER — TRAMADOL HCL 50 MG PO TABS: 100.0000 mg | ORAL_TABLET | Freq: Three times a day (TID) | ORAL | Status: DC | PRN

## 2012-08-20 NOTE — ED Notes (Signed)
Pt c/o cough x 4 days. Cough makes her have pain in her chest and back. Denies SOB. Pt is alert and oriented. Husband is translating for her.

## 2012-08-20 NOTE — ED Provider Notes (Signed)
Chief Complaint:   Chief Complaint  Patient presents with  . Cough    History of Present Illness:   Kathleen Holt is a 57 year old female who has had a four-day history of a dry cough and aching in her back and the rest of her body. She denies fever, chills, headache, nasal congestion, rhinorrhea, or sore throat. She has not had any wheezing or anterior chest pain. She denies any GI symptoms.  Review of Systems:  Other than noted above, the patient denies any of the following symptoms: Systemic:  No fevers, chills, sweats, weight loss or gain, fatigue, or tiredness. Eye:  No redness or discharge. ENT:  No ear pain, drainage, headache, nasal congestion, drainage, sinus pressure, difficulty swallowing, or sore throat. Neck:  No neck pain or swollen glands. Lungs:  No cough, sputum production, hemoptysis, wheezing, chest tightness, shortness of breath or chest pain. GI:  No abdominal pain, nausea, vomiting or diarrhea.  PMFSH:  Past medical history, family history, social history, meds, and allergies were reviewed. She has diabetes, coronary artery disease with stents, hyperlipidemia, diabetic neuropathy, mitral regurgitation, and a history of strokes. She takes aspirin, nitroglycerin, and a tourist and 10, Coreg, Plavix, Neurontin, NovoLog, Lantus, and Zestril. She is followed at the [facility] by Dr. Standley Dakins.  Physical Exam:   Vital signs:  BP 116/65  Pulse 79  Temp(Src) 97.8 F (36.6 C) (Oral)  Resp 14  SpO2 98% General:  Alert and oriented.  In no distress.  Skin warm and dry. Eye:  No conjunctival injection or drainage. Lids were normal. ENT:  TMs and canals were normal, without erythema or inflammation.  Nasal mucosa was clear and uncongested, without drainage.  Mucous membranes were moist.  Pharynx was clear with no exudate or drainage.  There were no oral ulcerations or lesions. Neck:  Supple, no adenopathy, tenderness or mass. Lungs:  No  respiratory distress.  Lungs were clear to auscultation, without wheezes, rales or rhonchi.  Breath sounds were clear and equal bilaterally.  Heart:  Regular rhythm, without gallops, murmers or rubs. Skin:  Clear, warm, and dry, without rash or lesions.   Radiology:  Dg Chest 2 View  08/20/2012   *RADIOLOGY REPORT*  Clinical Data: Cough.  Chest and back pain.  CHEST - 2 VIEW  Comparison: 02/11/2012  Findings: Cardiomegaly.  Mild vascular congestion.  No confluent airspace opacities or effusions.  No acute bony abnormality.  IMPRESSION: Cardiomegaly.  No acute findings.   Original Report Authenticated By: Charlett Nose, M.D.   Assessment:  The encounter diagnosis was Acute bronchitis.  No evidence of pneumonia.  Plan:   1.  The following meds were prescribed:   Discharge Medication List as of 08/20/2012 12:59 PM    START taking these medications   Details  albuterol (PROVENTIL HFA;VENTOLIN HFA) 108 (90 BASE) MCG/ACT inhaler Inhale 1-2 puffs into the lungs every 6 (six) hours as needed for wheezing., Starting 08/20/2012, Until Discontinued, Print    benzonatate (TESSALON) 200 MG capsule Take 1 capsule (200 mg total) by mouth 3 (three) times daily as needed for cough., Starting 08/20/2012, Until Discontinued, Print    traMADol (ULTRAM) 50 MG tablet Take 2 tablets (100 mg total) by mouth every 8 (eight) hours as needed for pain., Starting 08/20/2012, Until Discontinued, Print       Her husband states that she will not be getting back in to the [facility] and [facility] for another 3 months and needs refills on  all her medications, therefore she was given refills on the following: A tourist that in, carvedilol, Plavix, Neurontin, NovoLog, Lantus, and lisinopril, enough to last for 3 months.  2.  The patient was instructed in symptomatic care and handouts were given. 3.  The patient was told to return if becoming worse in any way, if no better in 3 or 4 days, and given some red flag  symptoms such as fever, worsening pain or difficulty breathing that would indicate earlier return. 4.  Follow up here if necessary.      Reuben Likes, MD 08/20/12 1728

## 2012-09-17 ENCOUNTER — Ambulatory Visit: Payer: No Typology Code available for payment source

## 2012-09-23 ENCOUNTER — Ambulatory Visit: Payer: No Typology Code available for payment source | Attending: Internal Medicine | Admitting: Internal Medicine

## 2012-09-23 ENCOUNTER — Encounter: Payer: Self-pay | Admitting: Internal Medicine

## 2012-09-23 VITALS — BP 134/81 | HR 87 | Temp 97.8°F | Resp 16

## 2012-09-23 DIAGNOSIS — I5020 Unspecified systolic (congestive) heart failure: Secondary | ICD-10-CM | POA: Insufficient documentation

## 2012-09-23 DIAGNOSIS — M542 Cervicalgia: Secondary | ICD-10-CM

## 2012-09-23 DIAGNOSIS — I2510 Atherosclerotic heart disease of native coronary artery without angina pectoris: Secondary | ICD-10-CM

## 2012-09-23 DIAGNOSIS — E785 Hyperlipidemia, unspecified: Secondary | ICD-10-CM

## 2012-09-23 DIAGNOSIS — Z23 Encounter for immunization: Secondary | ICD-10-CM

## 2012-09-23 DIAGNOSIS — R0989 Other specified symptoms and signs involving the circulatory and respiratory systems: Secondary | ICD-10-CM

## 2012-09-23 DIAGNOSIS — I509 Heart failure, unspecified: Secondary | ICD-10-CM | POA: Insufficient documentation

## 2012-09-23 DIAGNOSIS — IMO0002 Reserved for concepts with insufficient information to code with codable children: Secondary | ICD-10-CM

## 2012-09-23 DIAGNOSIS — E119 Type 2 diabetes mellitus without complications: Secondary | ICD-10-CM | POA: Insufficient documentation

## 2012-09-23 DIAGNOSIS — R9430 Abnormal result of cardiovascular function study, unspecified: Secondary | ICD-10-CM

## 2012-09-23 MED ORDER — PANTOPRAZOLE SODIUM 40 MG PO TBEC: 40.0000 mg | DELAYED_RELEASE_TABLET | Freq: Every day | ORAL | Status: DC

## 2012-09-23 MED ORDER — NITROGLYCERIN 0.4 MG SL SUBL: 0.4000 mg | SUBLINGUAL_TABLET | SUBLINGUAL | Status: DC | PRN

## 2012-09-23 MED ORDER — LISINOPRIL 2.5 MG PO TABS: 2.5000 mg | ORAL_TABLET | Freq: Every day | ORAL | Status: DC

## 2012-09-23 MED ORDER — INSULIN GLARGINE 100 UNIT/ML ~~LOC~~ SOLN: 35.0000 [IU] | Freq: Every day | SUBCUTANEOUS | Status: DC

## 2012-09-23 MED ORDER — FAMOTIDINE 20 MG PO TABS: 20.0000 mg | ORAL_TABLET | Freq: Two times a day (BID) | ORAL | Status: DC

## 2012-09-23 MED ORDER — INSULIN ASPART 100 UNIT/ML ~~LOC~~ SOLN: 10.0000 [IU] | Freq: Three times a day (TID) | SUBCUTANEOUS | Status: DC

## 2012-09-23 MED ORDER — ALBUTEROL SULFATE HFA 108 (90 BASE) MCG/ACT IN AERS: 1.0000 | INHALATION_SPRAY | Freq: Four times a day (QID) | RESPIRATORY_TRACT | Status: DC | PRN

## 2012-09-23 MED ORDER — GABAPENTIN 400 MG PO CAPS: 800.0000 mg | ORAL_CAPSULE | Freq: Three times a day (TID) | ORAL | Status: DC

## 2012-09-23 MED ORDER — CLOPIDOGREL BISULFATE 75 MG PO TABS: 75.0000 mg | ORAL_TABLET | Freq: Every day | ORAL | Status: DC

## 2012-09-23 MED ORDER — ATORVASTATIN CALCIUM 40 MG PO TABS: 40.0000 mg | ORAL_TABLET | Freq: Every day | ORAL | Status: DC

## 2012-09-23 MED ORDER — CARVEDILOL 3.125 MG PO TABS: 3.1250 mg | ORAL_TABLET | Freq: Two times a day (BID) | ORAL | Status: DC

## 2012-09-23 MED ORDER — TRAMADOL HCL 50 MG PO TABS: 100.0000 mg | ORAL_TABLET | Freq: Three times a day (TID) | ORAL | Status: DC | PRN

## 2012-09-23 NOTE — Progress Notes (Signed)
Patient Demographics  Kathleen Holt, is a 57 y.o. female  WUJ:[id number]  [id number]  DOB - July 24, 1955  Chief Complaint  Patient presents with  . Follow-up        Subjective:   Kathleen Holt today is here for a follow up visit.Only complaint is nocturnal cough. No fever. Also has mild neck pain  Patient has No headache, No chest pain, No abdominal pain - No Nausea, No new weakness tingling or numbness, No Cough - SOB.   Objective:    Filed Vitals:   09/23/12 1742  BP: 134/81  Pulse: 87  Temp: 97.8 F (36.6 C)  Resp: 16  SpO2: 100%     ALLERGIES:  No Known Allergies  PAST MEDICAL HISTORY: Past Medical History  Diagnosis Date  . Diabetes mellitus without complication   . Coronary artery disease     a. s/p stenting 11/2009 [facility]  . Hyperlipidemia   . Diabetic neuropathy   . Ejection fraction      EF 45-50%,  echo, February 10, 2012, akinesis posterior lateral wall, diastolic dysfunction, mild mitral regurgitation,  . Mitral regurgitation     Mild, echo, January, 2014  . Stroke     MEDICATIONS AT HOME: Prior to Admission medications   Medication Sig Start Date End Date Taking? Authorizing Provider  albuterol (PROVENTIL HFA;VENTOLIN HFA) 108 (90 BASE) MCG/ACT inhaler Inhale 1-2 puffs into the lungs every 6 (six) hours as needed for wheezing. 09/23/12   Jerzey Komperda Levora Dredge, MD  aspirin 81 MG tablet Take 4 tablets (325 mg total) by mouth daily. 02/11/12   Ok Anis, NP  atorvastatin (LIPITOR) 40 MG tablet Take 1 tablet (40 mg total) by mouth daily. 09/23/12   Kaylei Frink Levora Dredge, MD  benzonatate (TESSALON) 200 MG capsule Take 1 capsule (200 mg total) by mouth 3 (three) times daily as needed for cough. 08/20/12   Reuben Likes, MD  carvedilol (COREG) 3.125 MG tablet Take 1 tablet (3.125 mg total) by mouth 2 (two) times daily with a meal. 09/23/12   Maretta Bees, MD  clopidogrel (PLAVIX) 75 MG tablet Take 1 tablet (75 mg  total) by mouth daily. 09/23/12   Sabirin Baray Levora Dredge, MD  famotidine (PEPCID) 20 MG tablet Take 1 tablet (20 mg total) by mouth 2 (two) times daily. 09/23/12   Malaya Cagley Levora Dredge, MD  gabapentin (NEURONTIN) 400 MG capsule Take 2 capsules (800 mg total) by mouth 3 (three) times daily. 09/23/12   Robi Mitter Levora Dredge, MD  insulin aspart (NOVOLOG) 100 UNIT/ML injection Inject 10 Units into the skin 3 (three) times daily before meals. 09/23/12   Hokulani Rogel Levora Dredge, MD  insulin glargine (LANTUS) 100 UNIT/ML injection Inject 0.35 mLs (35 Units total) into the skin at bedtime. 09/23/12   Lenita Peregrina Levora Dredge, MD  lisinopril (PRINIVIL,ZESTRIL) 2.5 MG tablet Take 1 tablet (2.5 mg total) by mouth daily. 09/23/12   Shalunda Lindh Levora Dredge, MD  nitroGLYCERIN (NITROSTAT) 0.4 MG SL tablet Place 1 tablet (0.4 mg total) under the tongue every 5 (five) minutes x 3 doses as needed for chest pain. 09/23/12   Keriann Rankin Levora Dredge, MD  Polyethyl Glycol-Propyl Glycol 0.4-0.3 % SOLN 1 drop in each eye every 3 hours as needed. 08/20/12   Reuben Likes, MD  traMADol (ULTRAM) 50 MG tablet Take 2 tablets (100 mg total) by mouth every 8 (eight) hours as needed for pain. 09/23/12   Onesha Krebbs Levora Dredge, MD     Exam  General  appearance :Awake, alert, not in any distress. Speech Clear. Not toxic Looking HEENT: Atraumatic and Normocephalic, pupils equally reactive to light and accomodation Neck: supple, no JVD. No cervical lymphadenopathy.  Chest:Good air entry bilaterally, no added sounds  CVS: S1 S2 regular, no murmurs.  Abdomen: Bowel sounds present, Non tender and not distended with no gaurding, rigidity or rebound. Extremities: B/L Lower Ext shows no edema, both legs are warm to touch Neurology: Awake alert, and oriented X 3, CN II-XII intact, Non focal Skin:No Rash Wounds:N/A    Data Review   CBC No results found for this basename: WBC, HGB, HCT, PLT, MCV, MCH, MCHC, RDW, NEUTRABS, LYMPHSABS, MONOABS, EOSABS, BASOSABS, BANDABS, BANDSABD,  in the  last 168 hours  Chemistries   No results found for this basename: NA, K, CL, CO2, GLUCOSE, BUN, CREATININE, GFRCGP, CALCIUM, MG, AST, ALT, ALKPHOS, BILITOT,  in the last 168 hours ------------------------------------------------------------------------------------------------------------------ No results found for this basename: HGBA1C,  in the last 72 hours ------------------------------------------------------------------------------------------------------------------ No results found for this basename: CHOL, HDL, LDLCALC, TRIG, CHOLHDL, LDLDIRECT,  in the last 72 hours ------------------------------------------------------------------------------------------------------------------ No results found for this basename: TSH, T4TOTAL, FREET3, T3FREE, THYROIDAB,  in the last 72 hours ------------------------------------------------------------------------------------------------------------------ No results found for this basename: VITAMINB12, FOLATE, FERRITIN, TIBC, IRON, RETICCTPCT,  in the last 72 hours  Coagulation profile  No results found for this basename: INR, PROTIME,  in the last 168 hours    Assessment & Plan   Nocturnal Cough -?GERD -trial of pepcid-on plavix therefore will avoid Omeprazole, cannot afford Protonix -reassess in 1 month -life style interventions explained in great detail  Neck Pain -stable -no worrisome findings on exam -will check a Xray C spine  DM -continue with current Insulin Regimen  CAD -has kx of PCI -on ASA/Plavix -Follow with Dr Myrtis Ser  Systolic CHF -compensated  Health Maintenance -Colonoscopy:will refer -Pap Smear:will refer to GYN -Mammogram:will order -Vaccinations:   -Influenza  Follow up in  1 month  The patient was given clear instructions to go to ER or return to medical center if symptoms don't improve, worsen or new problems develop. The patient verbalized understanding. The patient was told to call to get lab results if  they haven't heard anything in the next week.

## 2012-09-23 NOTE — Progress Notes (Signed)
Patient here for follow up-DM HTN 

## 2012-09-25 ENCOUNTER — Ambulatory Visit (HOSPITAL_COMMUNITY)
Admission: RE | Admit: 2012-09-25 | Discharge: 2012-09-25 | Disposition: A | Discharge status: Home / Self Care | Payer: No Typology Code available for payment source | Source: Ambulatory Visit | Attending: Internal Medicine | Admitting: Internal Medicine

## 2012-09-25 DIAGNOSIS — M5030 Other cervical disc degeneration, unspecified cervical region: Secondary | ICD-10-CM | POA: Insufficient documentation

## 2012-09-25 DIAGNOSIS — M542 Cervicalgia: Secondary | ICD-10-CM | POA: Insufficient documentation

## 2012-09-25 DIAGNOSIS — W19XXXA Unspecified fall, initial encounter: Secondary | ICD-10-CM | POA: Insufficient documentation

## 2012-10-07 ENCOUNTER — Encounter: Payer: Self-pay | Admitting: Physician Assistant

## 2012-10-12 ENCOUNTER — Ambulatory Visit (INDEPENDENT_AMBULATORY_CARE_PROVIDER_SITE_OTHER): Payer: No Typology Code available for payment source | Admitting: Physician Assistant

## 2012-10-12 ENCOUNTER — Encounter: Payer: Self-pay | Admitting: Physician Assistant

## 2012-10-12 VITALS — BP 120/66 | HR 80 | Ht 60.0 in | Wt 184.2 lb

## 2012-10-12 DIAGNOSIS — R1031 Right lower quadrant pain: Secondary | ICD-10-CM

## 2012-10-12 DIAGNOSIS — Z1211 Encounter for screening for malignant neoplasm of colon: Secondary | ICD-10-CM

## 2012-10-12 DIAGNOSIS — Z7901 Long term (current) use of anticoagulants: Secondary | ICD-10-CM | POA: Insufficient documentation

## 2012-10-12 DIAGNOSIS — G8929 Other chronic pain: Secondary | ICD-10-CM

## 2012-10-12 DIAGNOSIS — R1032 Left lower quadrant pain: Secondary | ICD-10-CM

## 2012-10-12 MED ORDER — MOVIPREP 100 G PO SOLR: 1.0000 | Freq: Once | ORAL | Status: DC

## 2012-10-12 NOTE — Progress Notes (Signed)
Subjective:    Patient ID: Kathleen Holt, female    DOB: 1955-06-10, 57 y.o.   MRN: [id number]  HPI  Kathleen Holt is a pleasant 58 year old [place] female who does not speak much English and comes in with her husband and an interpreter today to discuss screening colonoscopy. She is referred by [employer]. Patient has not had any prior colon screening and currently has no symptoms other than a left lower abdominal pain which she says has been present for several years.  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 She also has a polyneuropathy, mitral regurg, left ventricular dysfunction with EF of 46% on echo February 2014. She has history of coronary artery disease and is status post 3 coronary stents November 2011 placed in in [place]. She's been maintained on Plavix and aspirin. She's currently followed by Dr. Myrtis Ser.     Review of Systems  Constitutional: Negative.   HENT: Negative.   Eyes: Negative.   Respiratory: Negative.   Cardiovascular: Negative.   Gastrointestinal: Negative.   Endocrine: Negative.   Genitourinary: Negative.   Musculoskeletal: Negative.   Skin: Negative.   Allergic/Immunologic: Negative.   Neurological: Positive for light-headedness.  Hematological: Negative.   Psychiatric/Behavioral: Negative.    Outpatient Prescriptions Prior to Visit  Medication Sig Dispense Refill  . albuterol (PROVENTIL HFA;VENTOLIN HFA) 108 (90 BASE) MCG/ACT inhaler Inhale 1-2 puffs into the lungs every 6 (six) hours as needed for wheezing.  1 Inhaler  3  . aspirin 81 MG tablet Take 4 tablets (325 mg total) by mouth daily.      Marland Kitchen atorvastatin (LIPITOR) 40 MG tablet Take 1 tablet (40 mg total) by mouth daily.  90 tablet  3  . benzonatate (TESSALON) 200 MG capsule Take 1 capsule (200 mg total) by mouth 3 (three) times daily as needed for cough.  30 capsule  0  . carvedilol (COREG) 3.125 MG tablet Take 1 tablet (3.125 mg total) by mouth 2  (two) times daily with a meal.  90 tablet  3  . clopidogrel (PLAVIX) 75 MG tablet Take 1 tablet (75 mg total) by mouth daily.  90 tablet  3  . gabapentin (NEURONTIN) 400 MG capsule Take 2 capsules (800 mg total) by mouth 3 (three) times daily.  180 capsule  3  . insulin aspart (NOVOLOG) 100 UNIT/ML injection Inject 10 Units into the skin 3 (three) times daily before meals.  1 vial  3  . insulin glargine (LANTUS) 100 UNIT/ML injection Inject 0.35 mLs (35 Units total) into the skin at bedtime.  10 mL  3  . lisinopril (PRINIVIL,ZESTRIL) 2.5 MG tablet Take 1 tablet (2.5 mg total) by mouth daily.  90 tablet  3  . nitroGLYCERIN (NITROSTAT) 0.4 MG SL tablet Place 1 tablet (0.4 mg total) under the tongue every 5 (five) minutes x 3 doses as needed for chest pain.  90 tablet  3  . pantoprazole (PROTONIX) 40 MG tablet Take 1 tablet (40 mg total) by mouth daily.  90 tablet  3  . Polyethyl Glycol-Propyl Glycol 0.4-0.3 % SOLN 1 drop in each eye every 3 hours as needed.  10 mL  12  . famotidine (PEPCID) 20 MG tablet Take 1 tablet (20 mg total) by mouth 2 (two) times daily.  90 tablet  3  . traMADol (ULTRAM) 50 MG tablet Take 2 tablets (100 mg total) by mouth every 8 (eight) hours as needed for pain.  30 tablet  0   No facility-administered  medications prior to visit.   No Known Allergies  Patient Active Problem List   Diagnosis Date Noted  . Chronic anticoagulation 10/12/2012  . Vision, loss, sudden 06/06/2012  . Blurry vision, right eye 06/06/2012  . Type II or unspecified type diabetes mellitus without mention of complication, uncontrolled 06/06/2012  . Visual changes 06/06/2012  . Constipation 04/20/2012  . Coronary artery disease   . Ejection fraction   . Mitral regurgitation   . Diabetic retinopathy 04/14/2012  . Polyneuropathy in diabetes(357.2) 04/14/2012  . Anemia 02/11/2012  . Dyslipidemia 02/11/2012     History  Substance Use Topics  . Smoking status: Never Smoker   . Smokeless  tobacco: Never Used  . Alcohol Use: No   family history includes CAD in her brother and father; Cancer in her father; Diabetes in her father; Hypertension in her father.     Objective:   Physical Exam Well-developed well-nourished [place] female accompanied by her husband and an interpreter, pleasant in no acute distress. Blood pressure 120/66 pulse 80 height 5 foot weight 184. HEENT; nontraumatic normocephalic EOMI PERRLA sclera anicteric, Supple ;no JVD, Cardiovascular; regular rate and rhythm with S1-S2 no murmur or gallop, Pulmonary ;clear bilaterally, Abdomen; large soft mildly tender in the left lower quadrant there is no palpable mass or hepatosplenomegaly bowel sounds are present, Rectal ;exam not done, Extremities; no clubbing cyanosis or edema skin warm and dry, Psych; mood and affect normal and appropriate       Assessment & Plan:   #75   57 year old [place] female referred for colon neoplasia surveillance, asymptomatic. #2 chronic antiplatelet therapy with Plavix and aspirin #3 coronary artery disease status post stents times 11/23/2009 #4 left ventricular dysfunction with EF of 46% on echo February 2014 #5 mitral regurgitation #6 insulin-dependent diabetes complicated by retinopathy #7 polyneuropathy #8 chronic left lower quadrant pain etiology unclear present time several years without change  Plan; patient is scheduled for colonoscopy with Dr. Kenney Houseman was discussed in detail with the patient and her husband via the interpreter. Patient's husband actually does understand and speak English fairly well. She will need to stop Plavix 5 days prior to the procedure, we'll obtain consent from her cardiologist Dr. Myrtis Ser. Rationale for holding Plavix was also discussed including relative risk benefit and they are agreeable to proceed.

## 2012-10-12 NOTE — Progress Notes (Signed)
Agree with initial assessment and plans as outlined. Cardiology opinion regarding the feasibility of holding Plavix and continuing aspirin for her procedure

## 2012-10-12 NOTE — Patient Instructions (Addendum)
We sent a prescription for the colonoscopy prep to [facility].  You have been scheduled for a colonoscopy with propofol. Please follow written instructions given to you at your visit today.  Please pick up your prep kit at the pharmacy within the next 1-3 days. If you use inhalers (even only as needed), please bring them with you on the day of your procedure.  We will contact you once we hear from Dr. Willa Rough regarding the Plavix.

## 2012-10-13 ENCOUNTER — Ambulatory Visit: Payer: No Typology Code available for payment source | Admitting: Physician Assistant

## 2012-10-16 ENCOUNTER — Other Ambulatory Visit: Payer: Self-pay | Admitting: Family Medicine

## 2012-10-19 ENCOUNTER — Telehealth: Payer: Self-pay | Admitting: *Deleted

## 2012-10-19 NOTE — Telephone Encounter (Signed)
I called and spoke to the patient's son . He said he would be glad to give his mother this information regarding the Plavix.  I told him she can stop the Plavix on 10-11 and resume it on 11-06-2012.  I gave him my name and number and told him she can call me if she has any questions.

## 2012-10-21 ENCOUNTER — Telehealth: Payer: Self-pay | Admitting: *Deleted

## 2012-10-21 NOTE — Telephone Encounter (Signed)
See phone note from 10-19-2012. Pt informed of Plavix clearance instructions.

## 2012-10-21 NOTE — Telephone Encounter (Signed)
Message copied by Derry Skill on Wed Oct 21, 2012  1:32 PM ------      Message from: Myrtis Ser, [place] D      Created: Tue Oct 13, 2012 10:58 AM      Regarding: RE: Plavix letter       OK to hold Plavix 5 days.      ----- Message -----         From: Derry Skill, CMA         Sent: 10/12/2012   3:22 PM           To: Luis Abed, MD      Subject: Plavix letter                                            10/12/2012                        RE: Lynann Demetrius      DOB: 12-Nov-1955      MRN: [id number]                  Dear Willa Rough,                   We have scheduled the above patient for an endoscopic procedure. Our records show that she is on anticoagulation therapy.             Please advise as to how long the patient may come off her therapy of Plavix prior to the procedure, which is scheduled for 11-05-2012.We normally recommend the patient hold the Plavix for 5 days.            Please fax back/ or route the completed form to [facility] CMA at [phone].             Sincerely,                  Ashby Dawes                              Amy Esterwood PA-C              ------

## 2012-10-22 ENCOUNTER — Ambulatory Visit: Payer: No Typology Code available for payment source | Attending: Internal Medicine | Admitting: Family Medicine

## 2012-10-22 DIAGNOSIS — I2510 Atherosclerotic heart disease of native coronary artery without angina pectoris: Secondary | ICD-10-CM

## 2012-10-22 DIAGNOSIS — D649 Anemia, unspecified: Secondary | ICD-10-CM

## 2012-10-22 DIAGNOSIS — E11319 Type 2 diabetes mellitus with unspecified diabetic retinopathy without macular edema: Secondary | ICD-10-CM

## 2012-10-22 DIAGNOSIS — H539 Unspecified visual disturbance: Secondary | ICD-10-CM

## 2012-10-22 DIAGNOSIS — H53131 Sudden visual loss, right eye: Secondary | ICD-10-CM

## 2012-10-22 DIAGNOSIS — M1990 Unspecified osteoarthritis, unspecified site: Secondary | ICD-10-CM

## 2012-10-22 DIAGNOSIS — H53139 Sudden visual loss, unspecified eye: Secondary | ICD-10-CM

## 2012-10-22 DIAGNOSIS — E1139 Type 2 diabetes mellitus with other diabetic ophthalmic complication: Secondary | ICD-10-CM

## 2012-10-22 MED ORDER — INSULIN GLARGINE 100 UNIT/ML SOLOSTAR PEN: 40.0000 [IU] | PEN_INJECTOR | Freq: Every day | SUBCUTANEOUS | Status: DC

## 2012-10-22 MED ORDER — INSULIN PEN NEEDLE 31G X 5 MM MISC: 1.0000 [IU] | Status: DC

## 2012-10-22 MED ORDER — INSULIN ASPART 100 UNIT/ML ~~LOC~~ SOLN: 14.0000 [IU] | Freq: Three times a day (TID) | SUBCUTANEOUS | Status: DC

## 2012-10-22 MED ORDER — INSULIN ASPART 100 UNIT/ML FLEXPEN: 14.0000 [IU] | PEN_INJECTOR | Freq: Three times a day (TID) | SUBCUTANEOUS | Status: DC

## 2012-10-22 NOTE — Progress Notes (Signed)
Patient ID: Kathleen Holt, female   DOB: 08-11-1955, 57 y.o.   MRN: [id number]  CC:  followup  Interpreter used to communicate directly with patient for entire encounter including providing detailed patient instructions  HPI: Pt report that she had an exam done in [place] and had labs done and an ECHO done revealing EF of 40%.  She has history of CAD and has stents in place.  She was told to follow up with cardiology.  Her diabetes is not controlled.  She has an A1c of 10.9%. She has received her flu vaccine and is scheduled for a colonoscopy next month.  She reports that she was told to start increasing her lantus by 1 unit until her fasting BS readings are 120.  Pt denies having chest pain and SOB.  She reports that she did not get an adequate eye exam and requesting an opthalmology referral.   No Known Allergies Past Medical History  Diagnosis Date  . Diabetes mellitus without complication   . Coronary artery disease     a. s/p stenting 11/2009 [facility]  . Hyperlipidemia   . Diabetic neuropathy   . Ejection fraction      EF 45-50%,  echo, February 10, 2012, akinesis posterior lateral wall, diastolic dysfunction, mild mitral regurgitation,  . Mitral regurgitation     Mild, echo, January, 2014  . Stroke    Current Outpatient Prescriptions on File Prior to Visit  Medication Sig Dispense Refill  . albuterol (PROVENTIL HFA;VENTOLIN HFA) 108 (90 BASE) MCG/ACT inhaler Inhale 1-2 puffs into the lungs every 6 (six) hours as needed for wheezing.  1 Inhaler  3  . aspirin 81 MG tablet Take 4 tablets (325 mg total) by mouth daily.      Marland Kitchen atorvastatin (LIPITOR) 40 MG tablet Take 1 tablet (40 mg total) by mouth daily.  90 tablet  3  . benzonatate (TESSALON) 200 MG capsule Take 1 capsule (200 mg total) by mouth 3 (three) times daily as needed for cough.  30 capsule  0  . carvedilol (COREG) 3.125 MG tablet Take 1 tablet (3.125 mg total) by mouth 2 (two) times daily with  a meal.  90 tablet  3  . clopidogrel (PLAVIX) 75 MG tablet Take 1 tablet (75 mg total) by mouth daily.  90 tablet  3  . gabapentin (NEURONTIN) 400 MG capsule Take 2 capsules (800 mg total) by mouth 3 (three) times daily.  180 capsule  3  . lisinopril (PRINIVIL,ZESTRIL) 2.5 MG tablet Take 1 tablet (2.5 mg total) by mouth daily.  90 tablet  3  . MOVIPREP 100 G SOLR Take 1 kit (200 g total) by mouth once. "Pharmacist please use BIN: [id number] GROUP: [id number] ID: [id number] Call [phone] for pharmacy questions "Pt will save $10"  1 kit  0  . nitroGLYCERIN (NITROSTAT) 0.4 MG SL tablet Place 1 tablet (0.4 mg total) under the tongue every 5 (five) minutes x 3 doses as needed for chest pain.  90 tablet  3  . pantoprazole (PROTONIX) 40 MG tablet Take 1 tablet (40 mg total) by mouth daily.  90 tablet  3  . Polyethyl Glycol-Propyl Glycol 0.4-0.3 % SOLN 1 drop in each eye every 3 hours as needed.  10 mL  12  . [DISCONTINUED] rosuvastatin (CRESTOR) 20 MG tablet Take 1 tablet (20 mg total) by mouth daily.  30 tablet  3   No current facility-administered medications on file prior to visit.   Family History  Problem Relation Age of Onset  . CAD Father     MI at age 62  . Diabetes Father   . Hypertension Father   . Cancer Father   . CAD Brother    History   Social History  . Marital Status: Married    Spouse Name: N/A    Number of Children: 3  . Years of Education: N/A   Occupational History  . Housewife    Social History Main Topics  . Smoking status: Never Smoker   . Smokeless tobacco: Never Used  . Alcohol Use: No  . Drug Use: No  . Sexual Activity: Yes    Birth Control/ Protection: Post-menopausal   Other Topics Concern  . Not on file   Social History Narrative  . No narrative on file    Review of Systems  Constitutional: Negative for fever, chills, diaphoresis, activity change, appetite change and fatigue.  HENT: Negative for ear pain, nosebleeds, congestion, facial  swelling, rhinorrhea, neck pain, neck stiffness and ear discharge.   Eyes: Negative for pain, discharge, redness, itching and visual disturbance.  Respiratory: Negative for cough, choking, chest tightness, shortness of breath, wheezing and stridor.   Cardiovascular: Negative for chest pain, palpitations and leg swelling.  Gastrointestinal: Negative for abdominal distention.  Genitourinary: Negative for dysuria, urgency, frequency, hematuria, flank pain, decreased urine volume, difficulty urinating and dyspareunia.  Musculoskeletal: Negative for back pain, joint swelling, arthralgias and gait problem.  Neurological: Negative for dizziness, tremors, seizures, syncope, facial asymmetry, speech difficulty, weakness, light-headedness, numbness and headaches.  Hematological: Negative for adenopathy. Does not bruise/bleed easily.  Psychiatric/Behavioral: Negative for hallucinations, behavioral problems, confusion, dysphoric mood, decreased concentration and agitation.    Objective:  There were no vitals filed for this visit.  Physical Exam  Constitutional: Appears well-developed and well-nourished. No distress.  HENT: Normocephalic. External right and left ear normal. Oropharynx is clear and moist.  Eyes: Conjunctivae and EOM are normal. PERRLA, no scleral icterus.  Neck: Normal ROM. Neck supple. No JVD. No tracheal deviation. No thyromegaly.  CVS: RRR, S1/S2 +, no murmurs, no gallops, no carotid bruit.  Pulmonary: Effort and breath sounds normal, no stridor, rhonchi, wheezes, rales.  Abdominal: Soft. BS +,  no distension, tenderness, rebound or guarding.  Musculoskeletal: Normal range of motion. No edema and no tenderness.  Lymphadenopathy: No lymphadenopathy noted, cervical, inguinal. Neuro: Alert. Normal reflexes, muscle tone coordination. No cranial nerve deficit. Skin: Skin is warm and dry. No rash noted. Not diaphoretic. No erythema. No pallor.  Psychiatric: Normal mood and affect.  Behavior, judgment, thought content normal.   Lab Results  Component Value Date   WBC 7.2 02/11/2012   HGB 11.8* 02/11/2012   HCT 37.1 02/11/2012   MCV 70.4* 02/11/2012   PLT 306 02/11/2012   Lab Results  Component Value Date   CREATININE 0.81 04/14/2012   BUN 10 04/14/2012   NA 138 04/14/2012   K 4.6 04/14/2012   CL 101 04/14/2012   CO2 28 04/14/2012    Lab Results  Component Value Date   HGBA1C 10.5* 04/14/2012   Lipid Panel     Component Value Date/Time   CHOL 122 04/14/2012 1051   TRIG 90 04/14/2012 1051   HDL 43 04/14/2012 1051   CHOLHDL 2.8 04/14/2012 1051   VLDL 18 04/14/2012 1051   LDLCALC 61 04/14/2012 1051       Assessment and plan:   Patient Active Problem List   Diagnosis Date Noted  . Osteoarthritis 10/22/2012  .  Chronic anticoagulation 10/12/2012  . Vision, loss, sudden 06/06/2012  . Blurry vision, right eye 06/06/2012  . Type II or unspecified type diabetes mellitus without mention of complication, uncontrolled 06/06/2012  . Visual changes 06/06/2012  . Constipation 04/20/2012  . Coronary artery disease   . Ejection fraction   . Mitral regurgitation   . Diabetic retinopathy 04/14/2012  . Polyneuropathy in diabetes(357.2) 04/14/2012  . Anemia 02/11/2012  . Dyslipidemia 02/11/2012   Increase novolog insulin to 14 units TIDAC, increase lantus to 36 units with instructions to increase by 1 unit every evening until fasting BS readings are around 120.   Hypoglycemia precautions discussed.  I reviewed all the recent labs she had done in [place].  The patient has had her flu vaccine already last week.  Pt was given clear instructions to please Call the clinic with her blood sugar readings in 1 week so we can help her titrate her insulin doses Schedule pt with the cardiology clinic on Wednesday in 2 weeks Opthalmology referral made for eval of her diabetic retinopathy.   RTC in 4 months.  Pt is traveling to [place] in next 2 months and will be gone for 2  months  The patient was given clear instructions to go to ER or return to medical center if symptoms don't improve, worsen or new problems develop.  The patient verbalized understanding.  The patient was told to call to get any lab results if not heard anything in the next week.    Rodney Langton, MD, CDE, FAAFP [facility], [place]   Results for orders placed during the hospital encounter of 04/14/12  COMPREHENSIVE METABOLIC PANEL      Result Value Range   Sodium 138  135 - 145 mEq/L   Potassium 4.6  3.5 - 5.1 mEq/L   Chloride 101  96 - 112 mEq/L   CO2 28  19 - 32 mEq/L   Glucose, Bld 191 (*) 70 - 99 mg/dL   BUN 10  6 - 23 mg/dL   Creatinine, Ser 1.61  0.50 - 1.10 mg/dL   Calcium 9.3  8.4 - 09.6 mg/dL   Total Protein 8.3  6.0 - 8.3 g/dL   Albumin 3.8  3.5 - 5.2 g/dL   AST 18  0 - 37 U/L   ALT 20  0 - 35 U/L   Alkaline Phosphatase 95  39 - 117 U/L   Total Bilirubin 0.3  0.3 - 1.2 mg/dL   GFR calc non Af Amer 79 (*) >90 mL/min   GFR calc Af Amer >90  >90 mL/min  LIPID PANEL      Result Value Range   Cholesterol 122  0 - 200 mg/dL   Triglycerides 90  <045 mg/dL   HDL 43  >40 mg/dL   Total CHOL/HDL Ratio 2.8     VLDL 18  0 - 40 mg/dL   LDL Cholesterol 61  0 - 99 mg/dL  HEMOGLOBIN J8J      Result Value Range   Hemoglobin A1C 10.5 (*) <5.7 %   Mean Plasma Glucose 255 (*) <117 mg/dL  TSH      Result Value Range   TSH 2.549  0.350 - 4.500 uIU/mL  GLUCOSE, CAPILLARY      Result Value Range   Glucose-Capillary 171 (*) 70 - 99 mg/dL

## 2012-10-22 NOTE — Patient Instructions (Addendum)
Try Tylenol Arthritis over the counter for arthritis pain  Osteoarthritis Osteoarthritis is the most common form of arthritis. It is redness, soreness, and swelling (inflammation) affecting the cartilage. Cartilage acts as a cushion, covering the ends of bones where they meet to form a joint. CAUSES  Over time, the cartilage begins to wear away. This causes bone to rub on bone. This produces pain and stiffness in the affected joints. Factors that contribute to this problem are:  Excessive body weight.  Age.  Overuse of joints. SYMPTOMS   People with osteoarthritis usually experience joint pain, swelling, or stiffness.  Over time, the joint may lose its normal shape.  Small deposits of bone (osteophytes) may grow on the edges of the joint.  Bits of bone or cartilage can break off and float inside the joint space. This may cause more pain and damage.  Osteoarthritis can lead to depression, anxiety, feelings of helplessness, and limitations on daily activities. The most commonly affected joints are in the:  Ends of the fingers.  Thumbs.  Neck.  Lower back.  Knees.  Hips. DIAGNOSIS  Diagnosis is mostly based on your symptoms and exam. Tests may be helpful, including:  X-rays of the affected joint.  A computerized magnetic scan (MRI).  Blood tests to rule out other types of arthritis.  Joint fluid tests. This involves using a needle to draw fluid from the joint and examining the fluid under a microscope. TREATMENT  Goals of treatment are to control pain, improve joint function, maintain a normal body weight, and maintain a healthy lifestyle. Treatment approaches may include:  A prescribed exercise program with rest and joint relief.  Weight control with nutritional education.  Pain relief techniques such as:  Properly applied heat and cold.  Electric pulses delivered to nerve endings under the skin (transcutaneous electrical nerve stimulation,  TENS).  Massage.  Certain supplements. Ask your caregiver before using any supplements, especially in combination with prescribed drugs.  Medicines to control pain, such as:  Acetaminophen.  Nonsteroidal anti-inflammatory drugs (NSAIDs), such as naproxen.  Narcotic or central-acting agents, such as tramadol. This drug carries a risk of addiction and is generally prescribed for short-term use.  Corticosteroids. These can be given orally or as injection. This is a short-term treatment, not recommended for routine use.  Surgery to reposition the bones and relieve pain (osteotomy) or to remove loose pieces of bone and cartilage. Joint replacement may be needed in advanced states of osteoarthritis. HOME CARE INSTRUCTIONS  Your caregiver can recommend specific types of exercise. These may include:  Strengthening exercises. These are done to strengthen the muscles that support joints affected by arthritis. They can be performed with weights or with exercise bands to add resistance.  Aerobic activities. These are exercises, such as brisk walking or low-impact aerobics, that get your heart pumping. They can help keep your lungs and circulatory system in shape.  Range-of-motion activities. These keep your joints limber.  Balance and agility exercises. These help you maintain daily living skills. Learning about your condition and being actively involved in your care will help improve the course of your osteoarthritis. SEEK MEDICAL CARE IF:   You feel hot or your skin turns red.  You develop a rash in addition to your joint pain.  You have an oral temperature above 102 F (38.9 C). FOR MORE INFORMATION  National Institute of Arthritis and Musculoskeletal and Skin Diseases: www.niams.http://www.myers.net/ [employer] on Aging: https://walker.com/ American College of Rheumatology: www.rheumatology.org Document Released: 01/07/2005 Document Revised:  04/01/2011 Document Reviewed: 04/20/2009 [employer]  Patient Information 2014 [place], [place]. Hypoglycemia (Low Blood Sugar) Hypoglycemia is when the glucose (sugar) in your blood is too low. Hypoglycemia can happen for many reasons. It can happen to people with or without diabetes. Hypoglycemia can develop quickly and can be a medical emergency.  CAUSES  Having hypoglycemia does not mean that you will develop diabetes. Different causes include:  Missed or delayed meals or not enough carbohydrates eaten.  Medication overdose. This could be by accident or deliberate. If by accident, your medication may need to be adjusted or changed.  Exercise or increased activity without adjustments in carbohydrates or medications.  A nerve disorder that affects body functions like your heart rate, blood pressure and digestion (autonomic neuropathy).  A condition where the stomach muscles do not function properly (gastroparesis). Therefore, medications may not absorb properly.  The inability to recognize the signs of hypoglycemia (hypoglycemic unawareness).  Absorption of insulin  may be altered.  Alcohol consumption.  Pregnancy/menstrual cycles/postpartum. This may be due to hormones.  Certain kinds of tumors. This is very rare. SYMPTOMS   Sweating.  Hunger.  Dizziness.  Blurred vision.  Drowsiness.  Weakness.  Headache.  Rapid heart beat.  Shakiness.  Nervousness. DIAGNOSIS  Diagnosis is made by monitoring blood glucose in one or all of the following ways:  Fingerstick blood glucose monitoring.  Laboratory results. TREATMENT  If you think your blood glucose is low:  Check your blood glucose, if possible. If it is less than 70 mg/dl, take one of the following:  3-4 glucose tablets.   cup juice (prefer clear like apple).   cup "regular" soda pop.  1 cup milk.  -1 tube of glucose gel.  5-6 hard candies.  Do not over treat because your blood glucose (sugar) will only go too high.  Wait 15 minutes and recheck your  blood glucose. If it is still less than 70 mg/dl (or below your target range), repeat treatment.  Eat a snack if it is more than one hour until your next meal. Sometimes, your blood glucose may go so low that you are unable to treat yourself. You may need someone to help you. You may even pass out or be unable to swallow. This may require you to get an injection of glucagon, which raises the blood glucose. HOME CARE INSTRUCTIONS  Check blood glucose as recommended by your caregiver.  Take medication as prescribed by your caregiver.  Follow your meal plan. Do not skip meals. Eat on time.  If you are going to drink alcohol, drink it only with meals.  Check your blood glucose before driving.  Check your blood glucose before and after exercise. If you exercise longer or different than usual, be sure to check blood glucose more frequently.  Always carry treatment with you. Glucose tablets are the easiest to carry.  Always wear medical alert jewelry or carry some form of identification that states that you have diabetes. This will alert people that you have diabetes. If you have hypoglycemia, they will have a better idea on what to do. SEEK MEDICAL CARE IF:   You are having problems keeping your blood sugar at target range.  You are having frequent episodes of hypoglycemia.  You feel you might be having side effects from your medicines.  You have symptoms of an illness that is not improving after 3-4 days.  You notice a change in vision or a new problem with your vision. SEEK IMMEDIATE MEDICAL CARE IF:  You are a family member or friend of a person whose blood glucose goes below 70 mg/dl and is accompanied by:  Confusion.  A change in mental status.  The inability to swallow.  Passing out. Document Released: 01/07/2005 Document Revised: 04/01/2011 Document Reviewed: 05/06/2011 [facility] Patient Information 2014 [place], [place]. Blood Sugar Monitoring, Adult GLUCOSE METERS FOR  SELF-MONITORING OF BLOOD GLUCOSE  It is important to be able to correctly measure your blood sugar (glucose). You can use a blood glucose monitor (a small battery-operated device) to check your glucose level at any time. This allows you and your caregiver to monitor your diabetes and to determine how well your treatment plan is working. The process of monitoring your blood glucose with a glucose meter is called self-monitoring of blood glucose (SMBG). When people with diabetes control their blood sugar, they have better health. To test for glucose with a typical glucose meter, place the disposable strip in the meter. Then place a small sample of blood on the "test strip." The test strip is coated with chemicals that combine with glucose in blood. The meter measures how much glucose is present. The meter displays the glucose level as a number. Several new models can record and store a number of test results. Some models can connect to personal computers to store test results or print them out.  Newer meters are often easier to use than older models. Some meters allow you to get blood from places other than your fingertip. Some new models have automatic timing, error codes, signals, or barcode readers to help with proper adjustment (calibration). Some meters have a large display screen or spoken instructions for people with visual impairments.  INSTRUCTIONS FOR USING GLUCOSE METERS  Wash your hands with soap and warm water, or clean the area with alcohol. Dry your hands completely.  Prick the side of your fingertip with a lancet (a sharp-pointed tool used by hand).  Hold the hand down and gently milk the finger until a small drop of blood appears. Catch the blood with the test strip.  Follow the instructions for inserting the test strip and using the SMBG meter. Most meters require the meter to be turned on and the test strip to be inserted before applying the blood sample.  Record the test  result.  Read the instructions carefully for both the meter and the test strips that go with it. Meter instructions are found in the user manual. Keep this manual to help you solve any problems that may arise. Many meters use "error codes" when there is a problem with the meter, the test strip, or the blood sample on the strip. You will need the manual to understand these error codes and fix the problem.  New devices are available such as laser lancets and meters that can test blood taken from "alternative sites" of the body, other than fingertips. However, you should use standard fingertip testing if your glucose changes rapidly. Also, use standard testing if:  You have eaten, exercised, or taken insulin in the past 2 hours.  You think your glucose is low.  You tend to not feel symptoms of low blood glucose (hypoglycemia).  You are ill or under stress.  Clean the meter as directed by the manufacturer.  Test the meter for accuracy as directed by the manufacturer.  Take your meter with you to your caregiver's office. This way, you can test your glucose in front of your caregiver to make sure you are using the meter correctly.  Your caregiver can also take a sample of blood to test using a routine lab method. If values on the glucose meter are close to the lab results, you and your caregiver will see that your meter is working well and you are using good technique. Your caregiver will advise you about what to do if the results do not match. FREQUENCY OF TESTING  Your caregiver will tell you how often you should check your blood glucose. This will depend on your type of diabetes, your current level of diabetes control, and your types of medicines. The following are general guidelines, but your care plan may be different. Record all your readings and the time of day you took them for review with your caregiver.   Diabetes type 1.  When you are using insulin with good diabetic control (either  multiple daily injections or via a pump), you should check your glucose 4 times a day.  If your diabetes is not well controlled, you may need to monitor more frequently, including before meals and 2 hours after meals, at bedtime, and occasionally between 2 a.m. and 3 a.m.  You should always check your glucose before a dose of insulin or before changing the rate on your insulin pump.  Diabetes type 2.  Guidelines for SMBG in diabetes type 2 are not as well defined.  If you are on insulin, follow the guidelines above.  If you are on medicines, but not insulin, and your glucose is not well controlled, you should test at least twice daily.  If you are not on insulin, and your diabetes is controlled with medicines or diet alone, you should test at least once daily, usually before breakfast.  A weekly profile will help your caregiver advise you on your care plan. The week before your visit, check your glucose before a meal and 2 hours after a meal at least daily. You may want to test before and after a different meal each day so you and your caregiver can tell how well controlled your blood sugars are throughout the course of a 24 hour period.  Gestational diabetes (diabetes during pregnancy).  Frequent testing is often necessary. Accurate timing is important.  If you are not on insulin, check your glucose 4 times a day. Check it before breakfast and 1 hour after the start of each meal.  If you are on insulin, check your glucose 6 times a day. Check it before each meal and 1 hour after the first bite of each meal.  General guidelines.  More frequent testing is required at the start of insulin treatment. Your caregiver will instruct you.  Test your glucose any time you suspect you have low blood sugar (hypoglycemia).  You should test more often when you change medicines, when you have unusual stress or illness, or in other unusual circumstances. OTHER THINGS TO KNOW ABOUT GLUCOSE  METERS  Measurement Range. Most glucose meters are able to read glucose levels over a broad range of values from as low as 0 to as high as 600 mg/dL. If you get an extremely high or low reading from your meter, you should first confirm it with another reading. Report very high or very low readings to your caregiver.  Whole Blood Glucose versus Plasma Glucose. Some older home glucose meters measure glucose in your whole blood. In a lab or when using some newer home glucose meters, the glucose is measured in your plasma (one component of blood). The difference can be important. It is  important for you and your caregiver to know whether your meter gives its results as "whole blood equivalent" or "plasma equivalent."  Display of High and Low Glucose Values. Part of learning how to operate a meter is understanding what the meter results mean. Know how high and low glucose concentrations are displayed on your meter.  Factors that Affect Glucose Meter Performance. The accuracy of your test results depends on many factors and varies depending on the brand and type of meter. These factors include:  Low red blood cell count (anemia).  Substances in your blood (such as uric acid, vitamin C, and others).  Environmental factors (temperature, humidity, altitude).  Name-brand versus generic test strips.  Calibration. Make sure your meter is set up properly. It is a good idea to do a calibration test with a control solution recommended by the manufacturer of your meter whenever you begin using a fresh bottle of test strips. This will help verify the accuracy of your meter.  Improperly stored, expired, or defective test strips. Keep your strips in a dry place with the lid on.  Soiled meter.  Inadequate blood sample. NEW TECHNOLOGIES FOR GLUCOSE TESTING Alternative site testing Some glucose meters allow testing blood from alternative sites. These include the:  Upper arm.  Forearm.  Base of the  thumb.  Thigh. Sampling blood from alternative sites may be desirable. However, it may have some limitations. Blood in the fingertips show changes in glucose levels more quickly than blood in other parts of the body. This means that alternative site test results may be different from fingertip test results, not because of the meter's ability to test accurately, but because the actual glucose concentration can be different.  Continuous Glucose Monitoring Devices to measure your blood glucose continuously are available, and others are in development. These methods can be more expensive than self-monitoring with a glucose meter. However, it is uncertain how effective and reliable these devices are. Your caregiver will advise you if this approach makes sense for you. IF BLOOD SUGARS ARE CONTROLLED, PEOPLE WITH DIABETES REMAIN HEALTHIER.  SMBG is an important part of the treatment plan of patients with diabetes mellitus. Below are reasons for using SMBG:   It confirms that your glucose is at a specific, healthy level.  It detects hypoglycemia and severe hyperglycemia.  It allows you and your caregiver to make adjustments in response to changes in lifestyle for individuals requiring medicine.  It determines the need for starting insulin therapy in temporary diabetes that happens during pregnancy (gestational diabetes). Document Released: 01/10/2003 Document Revised: 04/01/2011 Document Reviewed: 05/03/2010 [facility] Patient Information 2014 [place], [place].

## 2012-10-23 ENCOUNTER — Encounter: Payer: Self-pay | Admitting: Family Medicine

## 2012-10-23 ENCOUNTER — Other Ambulatory Visit: Payer: Self-pay | Admitting: Family Medicine

## 2012-10-23 DIAGNOSIS — E11319 Type 2 diabetes mellitus with unspecified diabetic retinopathy without macular edema: Secondary | ICD-10-CM | POA: Insufficient documentation

## 2012-10-23 MED ORDER — ESOMEPRAZOLE MAGNESIUM 40 MG PO CPDR: 40.0000 mg | DELAYED_RELEASE_CAPSULE | Freq: Every day | ORAL | Status: DC

## 2012-10-28 ENCOUNTER — Ambulatory Visit: Payer: No Typology Code available for payment source

## 2012-11-04 ENCOUNTER — Ambulatory Visit: Payer: No Typology Code available for payment source | Attending: Cardiology | Admitting: Cardiology

## 2012-11-04 ENCOUNTER — Encounter: Payer: Self-pay | Admitting: Cardiology

## 2012-11-04 VITALS — BP 104/64 | HR 75 | Temp 98.6°F | Resp 18 | Wt 187.0 lb

## 2012-11-04 DIAGNOSIS — I2510 Atherosclerotic heart disease of native coronary artery without angina pectoris: Secondary | ICD-10-CM

## 2012-11-04 DIAGNOSIS — E119 Type 2 diabetes mellitus without complications: Secondary | ICD-10-CM | POA: Insufficient documentation

## 2012-11-04 DIAGNOSIS — E785 Hyperlipidemia, unspecified: Secondary | ICD-10-CM

## 2012-11-04 DIAGNOSIS — Z7901 Long term (current) use of anticoagulants: Secondary | ICD-10-CM

## 2012-11-04 DIAGNOSIS — E11319 Type 2 diabetes mellitus with unspecified diabetic retinopathy without macular edema: Secondary | ICD-10-CM

## 2012-11-04 DIAGNOSIS — E1149 Type 2 diabetes mellitus with other diabetic neurological complication: Secondary | ICD-10-CM

## 2012-11-04 DIAGNOSIS — E1139 Type 2 diabetes mellitus with other diabetic ophthalmic complication: Secondary | ICD-10-CM

## 2012-11-04 DIAGNOSIS — E1142 Type 2 diabetes mellitus with diabetic polyneuropathy: Secondary | ICD-10-CM

## 2012-11-04 DIAGNOSIS — I340 Nonrheumatic mitral (valve) insufficiency: Secondary | ICD-10-CM

## 2012-11-04 DIAGNOSIS — I059 Rheumatic mitral valve disease, unspecified: Secondary | ICD-10-CM

## 2012-11-04 NOTE — Progress Notes (Signed)
HPI Kathleen Holt is a day for consultation and to establish with [place] as her cardiologist. She was seen recently by Dr. Laural Benes.  She had 3 drug-eluting stents placed in [place] in 2011. Her stent cards say that one is in the right coronary artery but does not designate the site of the other 2. Her last echocardiogram showed posterior lateral akinesia with mild mitral regurgitation and ejection fraction of 45-50%.  She has had diabetes for 10-12 years. Her hemoglobin A1c is above 10%. She takes her meds according to her husband but does not follow her diet. She is overweight. She already has other complications of her diabetes including neuropathy.  She denies any chest pain or ischemic symptoms. She's had no claudications. Meds reviewed and she is on a good program.  Last lipid profile was at goal  with an LDL 61. Triglycerides were remarkably normal. Renal function shows a creatinine of 0.81 with EGFR greater than 90.  Past Medical History  Diagnosis Date  . Diabetes mellitus without complication   . Coronary artery disease     a. s/p stenting 11/2009 [facility]  . Hyperlipidemia   . Diabetic neuropathy   . Ejection fraction      EF 45-50%,  echo, February 10, 2012, akinesis posterior lateral Kathleen Holt, diastolic dysfunction, mild mitral regurgitation,  . Mitral regurgitation     Mild, echo, January, 2014  . Stroke     Current Outpatient Prescriptions  Medication Sig Dispense Refill  . albuterol (PROVENTIL HFA;VENTOLIN HFA) 108 (90 BASE) MCG/ACT inhaler Inhale 1-2 puffs into the lungs every 6 (six) hours as needed for wheezing.  1 Inhaler  3  . aspirin 81 MG tablet Take 4 tablets (325 mg total) by mouth daily.      Marland Kitchen atorvastatin (LIPITOR) 40 MG tablet Take 1 tablet (40 mg total) by mouth daily.  90 tablet  3  . carvedilol (COREG) 3.125 MG tablet Take 1 tablet (3.125 mg total) by mouth 2 (two) times daily with a meal.  90 tablet  3  . clopidogrel (PLAVIX) 75 MG  tablet Take 1 tablet (75 mg total) by mouth daily.  90 tablet  3  . esomeprazole (NEXIUM) 40 MG capsule Take 1 capsule (40 mg total) by mouth daily.  30 capsule  3  . gabapentin (NEURONTIN) 400 MG capsule Take 2 capsules (800 mg total) by mouth 3 (three) times daily.  180 capsule  3  . insulin aspart (NOVOLOG FLEXPEN) 100 UNIT/ML SOPN FlexPen Inject 14 Units into the skin 3 (three) times daily with meals.  5 pen  5  . Insulin Glargine (LANTUS SOLOSTAR) 100 UNIT/ML SOPN Inject 40 Units into the skin at bedtime.  5 pen  5  . Insulin Pen Needle 31G X 5 MM MISC 1 Units by Does not apply route as directed.  100 each  5  . lisinopril (PRINIVIL,ZESTRIL) 2.5 MG tablet Take 1 tablet (2.5 mg total) by mouth daily.  90 tablet  3  . MOVIPREP 100 G SOLR Take 1 kit (200 g total) by mouth once. "Pharmacist please use BIN: [id number] GROUP: [id number] ID: [id number] Call [phone] for pharmacy questions "Pt will save $10"  1 kit  0  . nitroGLYCERIN (NITROSTAT) 0.4 MG SL tablet Place 1 tablet (0.4 mg total) under the tongue every 5 (five) minutes x 3 doses as needed for chest pain.  90 tablet  3  . benzonatate (TESSALON) 200 MG capsule Take 1 capsule (200 mg  total) by mouth 3 (three) times daily as needed for cough.  30 capsule  0  . Polyethyl Glycol-Propyl Glycol 0.4-0.3 % SOLN 1 drop in each eye every 3 hours as needed.  10 mL  12  . [DISCONTINUED] rosuvastatin (CRESTOR) 20 MG tablet Take 1 tablet (20 mg total) by mouth daily.  30 tablet  3   No current facility-administered medications for this visit.    No Known Allergies  Family History  Problem Relation Age of Onset  . CAD Father     MI at age 56  . Diabetes Father   . Hypertension Father   . Cancer Father   . CAD Brother     History   Social History  . Marital Status: Married    Spouse Name: N/A    Number of Children: 3  . Years of Education: N/A   Occupational History  . Housewife    Social History Main Topics  . Smoking status:  Never Smoker   . Smokeless tobacco: Never Used  . Alcohol Use: No  . Drug Use: No  . Sexual Activity: Yes    Birth Control/ Protection: Post-menopausal   Other Topics Concern  . Not on file   Social History Narrative  . No narrative on file    ROS ALL NEGATIVE EXCEPT THOSE NOTED IN HPI  PE  General Appearance: well developed, well nourished in no acute distress, obese, does not speak good English HEENT: symmetrical face, PERRLA,  Neck: no JVD, thyromegaly, or adenopathy, trachea midline Chest: symmetric without deformity Cardiac: PMI non-displaced, RRR, normal S1, S2, no gallop or murmur Lung: clear to ausculation and percussion Vascular: all pulses  present but mildly reduced in the lower extremities. There no bruits. Abdominal: nondistended, nontender, good bowel sounds, no HSM, no bruits Extremities: no cyanosis, clubbing or edema, no sign of DVT, no varicosities  Skin: normal color, no rashes Neuro: alert and oriented x 3, non-focal Pysch: normal affect  EKG EKG in January showed normal sinus rhythm with low voltage. BMET    Component Value Date/Time   NA 138 04/14/2012 1051   K 4.6 04/14/2012 1051   CL 101 04/14/2012 1051   CO2 28 04/14/2012 1051   GLUCOSE 191* 04/14/2012 1051   BUN 10 04/14/2012 1051   CREATININE 0.81 04/14/2012 1051   CALCIUM 9.3 04/14/2012 1051   GFRNONAA 79* 04/14/2012 1051   GFRAA >90 04/14/2012 1051    Lipid Panel     Component Value Date/Time   CHOL 122 04/14/2012 1051   TRIG 90 04/14/2012 1051   HDL 43 04/14/2012 1051   CHOLHDL 2.8 04/14/2012 1051   VLDL 18 04/14/2012 1051   LDLCALC 61 04/14/2012 1051    CBC    Component Value Date/Time   WBC 7.2 02/11/2012 0915   RBC 5.27* 02/11/2012 0915   HGB 11.8* 02/11/2012 0915   HCT 37.1 02/11/2012 0915   PLT 306 02/11/2012 0915   MCV 70.4* 02/11/2012 0915   MCH 22.4* 02/11/2012 0915   MCHC 31.8 02/11/2012 0915   RDW 16.1* 02/11/2012 0915   LYMPHSABS 3.3 02/10/2012 1314   MONOABS 0.7 02/10/2012 1314    EOSABS 0.1 02/10/2012 1314   BASOSABS 0.0 02/10/2012 1314

## 2012-11-04 NOTE — Assessment & Plan Note (Signed)
This is mild. No indication for repeat echocardiogram or change in treatment. Follow clinically.

## 2012-11-04 NOTE — Progress Notes (Signed)
Via husband, interpreter... Pt is here for a f/u and to see cardio Hx of CAD and DM... At the moment, voices no new concerns She is alert w/no signs of acute distress... Ambulated to exam room w/NAD

## 2012-11-04 NOTE — Assessment & Plan Note (Signed)
Stable. Continue secondary preventative therapy. All meds up to date for her trip to [place]. I'll see her back in 3 months.

## 2012-11-05 ENCOUNTER — Ambulatory Visit (AMBULATORY_SURGERY_CENTER): Payer: Self-pay | Admitting: Internal Medicine

## 2012-11-05 ENCOUNTER — Encounter: Payer: Self-pay | Admitting: Internal Medicine

## 2012-11-05 VITALS — BP 111/71 | HR 61 | Temp 98.7°F | Resp 18 | Ht 60.0 in | Wt 184.0 lb

## 2012-11-05 DIAGNOSIS — D126 Benign neoplasm of colon, unspecified: Secondary | ICD-10-CM

## 2012-11-05 DIAGNOSIS — Z1211 Encounter for screening for malignant neoplasm of colon: Secondary | ICD-10-CM

## 2012-11-05 LAB — GLUCOSE, CAPILLARY
Glucose-Capillary: 57 mg/dL — ABNORMAL LOW (ref 70–99)
Glucose-Capillary: 67 mg/dL — ABNORMAL LOW (ref 70–99)
Glucose-Capillary: 90 mg/dL (ref 70–99)
Glucose-Capillary: 205 mg/dL — ABNORMAL HIGH (ref 70–99)

## 2012-11-05 MED ORDER — DEXTROSE 5 % IV SOLN: INTRAVENOUS | Status: DC

## 2012-11-05 NOTE — Progress Notes (Signed)
Called to room to assist during endoscopic procedure.  Patient ID and intended procedure confirmed with present staff. Received instructions for my participation in the procedure from the performing physician.  

## 2012-11-05 NOTE — Patient Instructions (Signed)

## 2012-11-05 NOTE — Progress Notes (Signed)
11-05-12  Finger stick 90.

## 2012-11-05 NOTE — Op Note (Signed)
[facility] [address] [place], [address]   COLONOSCOPY PROCEDURE REPORT  PATIENT: Holt, Kathleen  MR#: [id number] BIRTHDATE: 1955/06/02 , 57  yrs. old GENDER: Female ENDOSCOPIST: Roxy Cedar, MD REFERRED QI:[id number] Laural Benes, M.D. PROCEDURE DATE:  11/05/2012 PROCEDURE:   Colonoscopy with snare polypectomy x 1 First Screening Colonoscopy - Avg.  risk and is 50 yrs.  old or older Yes.  Prior Negative Screening - Now for repeat screening. N/A  History of Adenoma - Now for follow-up colonoscopy & has been > or = to 3 yrs.  N/A  Polyps Removed Today? Yes. ASA CLASS:   Class III INDICATIONS:average risk screening. MEDICATIONS: MAC sedation, administered by CRNA and propofol (Diprivan) 250mg  IV  DESCRIPTION OF PROCEDURE:   After the risks benefits and alternatives of the procedure were thoroughly explained, informed consent was obtained.  A digital rectal exam revealed no abnormalities of the rectum.   The LB [id number]  endoscope was introduced through the anus and advanced to the cecum, which was identified by both the appendix and ileocecal valve. No adverse events experienced.   The quality of the prep was excellent, using MoviPrep  The instrument was then slowly withdrawn as the colon was fully examined.   COLON FINDINGS: A diminutive polyp was found in the descending colon.  A polypectomy was performed with a cold snare.  The resection was complete and the polyp tissue was completely retrieved.   The colon was otherwise normal.  There was no diverticulosis, inflammation, other polyps or cancers . Retroflexed views revealed no abnormalities. The time to cecum=4 minutes 15 seconds.  Withdrawal time=11 minutes 15 seconds.  The scope was withdrawn and the procedure completed. COMPLICATIONS: There were no complications.  ENDOSCOPIC IMPRESSION: 1.   Diminutive polyp was found in the descending colon; polypectomy was performed with a cold snare 2.    The colon was otherwise normal  RECOMMENDATIONS: 1.  Resume Plavix today 2.  Repeat colonoscopy in 5 years if polyp adenomatous; otherwise 10 years   eSigned:  Roxy Cedar, MD 11/05/2012 3:51 PM   cc: Standley Dakins MD    ; The Patient

## 2012-11-05 NOTE — Progress Notes (Signed)
Lidocaine-40mg IV prior to Propofol InductionPropofol given over incremental dosages 

## 2012-11-05 NOTE — Progress Notes (Signed)
Patient did not experience any of the following events: a burn prior to discharge; a fall within the facility; wrong site/side/patient/procedure/implant event; or a hospital transfer or hospital admission upon discharge from the facility. (G8907) Patient did not have preoperative order for IV antibiotic SSI prophylaxis. (G8918)  

## 2012-11-06 ENCOUNTER — Telehealth: Payer: Self-pay | Admitting: *Deleted

## 2012-11-06 NOTE — Telephone Encounter (Signed)
  Follow up Call-  Call back number 11/05/2012  Post procedure Call Back phone  # [phone]  Permission to leave phone message Yes     Patient questions:  Do you have a fever, pain , or abdominal swelling? no Pain Score  0 *  Have you tolerated food without any problems? yes  Have you been able to return to your normal activities? yes  Do you have any questions about your discharge instructions: Diet   no Medications  no Follow up visit  no  Do you have questions or concerns about your Care? no  Actions: * If pain score is 4 or above: No action needed, pain <4.

## 2012-11-10 ENCOUNTER — Encounter: Payer: Self-pay | Admitting: Internal Medicine

## 2012-12-04 ENCOUNTER — Encounter: Payer: Self-pay | Admitting: Family Medicine

## 2012-12-04 ENCOUNTER — Ambulatory Visit (INDEPENDENT_AMBULATORY_CARE_PROVIDER_SITE_OTHER): Payer: No Typology Code available for payment source | Admitting: Family Medicine

## 2012-12-04 ENCOUNTER — Telehealth: Payer: Self-pay

## 2012-12-04 VITALS — BP 116/74 | HR 80 | Temp 96.1°F | Ht <= 58 in | Wt 188.7 lb

## 2012-12-04 DIAGNOSIS — Z01419 Encounter for gynecological examination (general) (routine) without abnormal findings: Secondary | ICD-10-CM

## 2012-12-04 DIAGNOSIS — Z90710 Acquired absence of both cervix and uterus: Secondary | ICD-10-CM

## 2012-12-04 NOTE — Telephone Encounter (Signed)
Patient is [place] be out of the country till  Feb 1  Refilled Neurotron so she will have enough to last  Her till she returns

## 2012-12-04 NOTE — Patient Instructions (Signed)
Nice to meet you!

## 2012-12-04 NOTE — Progress Notes (Signed)
Subjective:     Patient ID: Kathleen Holt, female   DOB: Dec 21, 1955, 57 y.o.   MRN: [id number]  HPI  57 yo who presents for routine gyn exam.   - never had a pap smear in her life - married, with 3 kids [phone]) -  Has had a hysterectomy but doesn't know if they took the cervix or ovaries - had normal mammogram 6 months ago   - [facility]  Past Medical History  Diagnosis Date  . Diabetes mellitus without complication   . Coronary artery disease     a. s/p stenting 11/2009 [facility]  . Hyperlipidemia   . Diabetic neuropathy   . Ejection fraction      EF 45-50%,  echo, February 10, 2012, akinesis posterior lateral wall, diastolic dysfunction, mild mitral regurgitation,  . Mitral regurgitation     Mild, echo, January, 2014  . Stroke    History   Social History  . Marital Status: Married    Spouse Name: N/A    Number of Children: 3  . Years of Education: N/A   Occupational History  . Housewife    Social History Main Topics  . Smoking status: Never Smoker   . Smokeless tobacco: Never Used  . Alcohol Use: No  . Drug Use: No  . Sexual Activity: Yes    Birth Control/ Protection: Post-menopausal   Other Topics Concern  . Not on file   Social History Narrative  . No narrative on file   Family History  Problem Relation Age of Onset  . CAD Father     MI at age 17  . Diabetes Father   . Hypertension Father   . Cancer Father   . CAD Brother    Current Outpatient Prescriptions on File Prior to Visit  Medication Sig Dispense Refill  . aspirin 81 MG tablet Take 4 tablets (325 mg total) by mouth daily.      Marland Kitchen atorvastatin (LIPITOR) 40 MG tablet Take 1 tablet (40 mg total) by mouth daily.  90 tablet  3  . carvedilol (COREG) 3.125 MG tablet Take 1 tablet (3.125 mg total) by mouth 2 (two) times daily with a meal.  90 tablet  3  . clopidogrel (PLAVIX) 75 MG tablet Take 1 tablet (75 mg  total) by mouth daily.  90 tablet  3  . esomeprazole (NEXIUM) 40 MG capsule Take 1 capsule (40 mg total) by mouth daily.  30 capsule  3  . gabapentin (NEURONTIN) 400 MG capsule Take 2 capsules (800 mg total) by mouth 3 (three) times daily.  180 capsule  3  . insulin aspart (NOVOLOG FLEXPEN) 100 UNIT/ML SOPN FlexPen Inject 14 Units into the skin 3 (three) times daily with meals.  5 pen  5  . Insulin Glargine (LANTUS SOLOSTAR) 100 UNIT/ML SOPN Inject 40 Units into the skin at bedtime.  5 pen  5  . Insulin Pen Needle 31G X 5 MM MISC 1 Units by Does not apply route as directed.  100 each  5  . lisinopril (PRINIVIL,ZESTRIL) 2.5 MG tablet Take 1 tablet (2.5 mg total) by mouth daily.  90 tablet  3  . nitroGLYCERIN (NITROSTAT) 0.4 MG SL tablet Place 1 tablet (0.4 mg total) under the tongue every 5 (five) minutes x 3 doses as needed for chest pain.  90 tablet  3  . Polyethyl Glycol-Propyl Glycol 0.4-0.3 % SOLN 1 drop in  each eye every 3 hours as needed.  10 mL  12  . albuterol (PROVENTIL HFA;VENTOLIN HFA) 108 (90 BASE) MCG/ACT inhaler Inhale 1-2 puffs into the lungs every 6 (six) hours as needed for wheezing.  1 Inhaler  3  . benzonatate (TESSALON) 200 MG capsule Take 1 capsule (200 mg total) by mouth 3 (three) times daily as needed for cough.  30 capsule  0  . [DISCONTINUED] rosuvastatin (CRESTOR) 20 MG tablet Take 1 tablet (20 mg total) by mouth daily.  30 tablet  3   No current facility-administered medications on file prior to visit.     Review of Systems See above    Objective:   Physical Exam BP 116/74  Pulse 80  Temp(Src) 96.1 F (35.6 C)  Ht 4\' 9"  (1.448 m)  Wt 188 lb 11.2 oz (85.594 kg)  BMI 40.82 kg/m2 GENERAL: Well-developed, well-nourished female in no acute distress.  HEENT: Normocephalic, atraumatic. Sclerae anicteric.  NECK: Supple. Normal thyroid.  LUNGS: Clear to auscultation bilaterally.  HEART: Regular rate and rhythm. BREASTS: Symmetric in size. No masses, skin changes,  nipple drainage, or lymphadenopathy. ABDOMEN: Soft, nontender, nondistended. No organomegaly. PELVIC: Normal external female genitalia. Vagina is pink and rugated.  Normal discharge. Cervix found but no uterus palpated.  Pap smear obtained.  No adnexal mass or tenderness although difficult to palpate due to body habitus.  EXTREMITIES: No cyanosis, clubbing, or edema, 2+ distal pulses.     Assessment:     Well female exam with routine gynecological exam      Plan:     - normal exam today - s/p hysterectomy but cervix still found - PAP obtained - mammogram UTD - encouraged diet and exercise - f/u with regular pcp given amount of medical problems. - no labs needed today  F/u in 1 week.

## 2013-05-05 ENCOUNTER — Ambulatory Visit: Payer: No Typology Code available for payment source

## 2013-05-07 ENCOUNTER — Other Ambulatory Visit: Payer: Self-pay | Admitting: Emergency Medicine

## 2013-05-07 ENCOUNTER — Ambulatory Visit: Payer: No Typology Code available for payment source | Attending: Internal Medicine | Admitting: Internal Medicine

## 2013-05-07 ENCOUNTER — Encounter: Payer: Self-pay | Admitting: Internal Medicine

## 2013-05-07 VITALS — BP 144/79 | HR 74 | Temp 97.9°F | Resp 20 | Ht <= 58 in | Wt 186.0 lb

## 2013-05-07 DIAGNOSIS — Z760 Encounter for issue of repeat prescription: Secondary | ICD-10-CM | POA: Insufficient documentation

## 2013-05-07 DIAGNOSIS — E785 Hyperlipidemia, unspecified: Secondary | ICD-10-CM | POA: Insufficient documentation

## 2013-05-07 DIAGNOSIS — E1149 Type 2 diabetes mellitus with other diabetic neurological complication: Secondary | ICD-10-CM | POA: Insufficient documentation

## 2013-05-07 DIAGNOSIS — E119 Type 2 diabetes mellitus without complications: Secondary | ICD-10-CM

## 2013-05-07 DIAGNOSIS — Z794 Long term (current) use of insulin: Secondary | ICD-10-CM | POA: Insufficient documentation

## 2013-05-07 DIAGNOSIS — Z8673 Personal history of transient ischemic attack (TIA), and cerebral infarction without residual deficits: Secondary | ICD-10-CM | POA: Insufficient documentation

## 2013-05-07 DIAGNOSIS — I2589 Other forms of chronic ischemic heart disease: Secondary | ICD-10-CM | POA: Insufficient documentation

## 2013-05-07 DIAGNOSIS — I10 Essential (primary) hypertension: Secondary | ICD-10-CM | POA: Insufficient documentation

## 2013-05-07 DIAGNOSIS — E1142 Type 2 diabetes mellitus with diabetic polyneuropathy: Secondary | ICD-10-CM | POA: Insufficient documentation

## 2013-05-07 DIAGNOSIS — I2510 Atherosclerotic heart disease of native coronary artery without angina pectoris: Secondary | ICD-10-CM | POA: Insufficient documentation

## 2013-05-07 LAB — CBC WITH DIFFERENTIAL/PLATELET
Basophils Absolute: 0 10*3/uL (ref 0.0–0.1)
Basophils Relative: 0 % (ref 0–1)
Eosinophils Absolute: 0.1 10*3/uL (ref 0.0–0.7)
Eosinophils Relative: 1 % (ref 0–5)
HCT: 36.8 % (ref 36.0–46.0)
HEMOGLOBIN: 12.1 g/dL (ref 12.0–15.0)
LYMPHS PCT: 25 % (ref 12–46)
Lymphs Abs: 2.3 10*3/uL (ref 0.7–4.0)
MCH: 22.8 pg — AB (ref 26.0–34.0)
MCHC: 32.9 g/dL (ref 30.0–36.0)
MCV: 69.4 fL — ABNORMAL LOW (ref 78.0–100.0)
MONOS PCT: 5 % (ref 3–12)
Monocytes Absolute: 0.5 10*3/uL (ref 0.1–1.0)
NEUTROS ABS: 6.2 10*3/uL (ref 1.7–7.7)
Neutrophils Relative %: 69 % (ref 43–77)
Platelets: 353 10*3/uL (ref 150–400)
RBC: 5.3 MIL/uL — ABNORMAL HIGH (ref 3.87–5.11)
RDW: 16.1 % — ABNORMAL HIGH (ref 11.5–15.5)
WBC: 9 10*3/uL (ref 4.0–10.5)

## 2013-05-07 LAB — COMPLETE METABOLIC PANEL WITH GFR
ALBUMIN: 4 g/dL (ref 3.5–5.2)
ALK PHOS: 71 U/L (ref 39–117)
ALT: 18 U/L (ref 0–35)
AST: 17 U/L (ref 0–37)
BUN: 14 mg/dL (ref 6–23)
CO2: 28 meq/L (ref 19–32)
Calcium: 9.4 mg/dL (ref 8.4–10.5)
Chloride: 100 mEq/L (ref 96–112)
Creat: 0.94 mg/dL (ref 0.50–1.10)
GFR, EST NON AFRICAN AMERICAN: 66 mL/min
GFR, Est African American: 76 mL/min
Glucose, Bld: 167 mg/dL — ABNORMAL HIGH (ref 70–99)
POTASSIUM: 5.3 meq/L (ref 3.5–5.3)
SODIUM: 137 meq/L (ref 135–145)
TOTAL PROTEIN: 7.5 g/dL (ref 6.0–8.3)
Total Bilirubin: 0.4 mg/dL (ref 0.2–1.2)

## 2013-05-07 LAB — LIPID PANEL
CHOLESTEROL: 113 mg/dL (ref 0–200)
HDL: 40 mg/dL (ref >39)
LDL CALC: 55 mg/dL (ref 0–99)
Total CHOL/HDL Ratio: 2.8 Ratio
Triglycerides: 91 mg/dL (ref <150)
VLDL: 18 mg/dL (ref 0–40)

## 2013-05-07 LAB — GLUCOSE, POCT (MANUAL RESULT ENTRY): POC GLUCOSE: 148 mg/dL — AB (ref 70–99)

## 2013-05-07 LAB — POCT GLYCOSYLATED HEMOGLOBIN (HGB A1C): Hemoglobin A1C: 10.1

## 2013-05-07 MED ORDER — CARVEDILOL 3.125 MG PO TABS: 3.1250 mg | ORAL_TABLET | Freq: Two times a day (BID) | ORAL | Status: DC

## 2013-05-07 MED ORDER — PANTOPRAZOLE SODIUM 40 MG PO TBEC: 40.0000 mg | DELAYED_RELEASE_TABLET | Freq: Every day | ORAL | Status: DC

## 2013-05-07 MED ORDER — INSULIN ASPART 100 UNIT/ML FLEXPEN: 10.0000 [IU] | PEN_INJECTOR | Freq: Three times a day (TID) | SUBCUTANEOUS | Status: DC

## 2013-05-07 MED ORDER — INSULIN GLARGINE 100 UNIT/ML SOLOSTAR PEN: 50.0000 [IU] | PEN_INJECTOR | Freq: Every day | SUBCUTANEOUS | Status: DC

## 2013-05-07 MED ORDER — LISINOPRIL 2.5 MG PO TABS: 2.5000 mg | ORAL_TABLET | Freq: Every day | ORAL | Status: DC

## 2013-05-07 MED ORDER — GABAPENTIN 400 MG PO CAPS: 800.0000 mg | ORAL_CAPSULE | Freq: Three times a day (TID) | ORAL | Status: DC

## 2013-05-07 MED ORDER — GABAPENTIN 300 MG PO CAPS: 300.0000 mg | ORAL_CAPSULE | Freq: Three times a day (TID) | ORAL | Status: DC

## 2013-05-07 MED ORDER — CLOPIDOGREL BISULFATE 75 MG PO TABS: 75.0000 mg | ORAL_TABLET | Freq: Every day | ORAL | Status: DC

## 2013-05-07 NOTE — Progress Notes (Signed)
Patient here to for annual exam and to discuss her medications with physician. CBG-148 today, A1C-10.1 Interpretor present

## 2013-05-07 NOTE — Progress Notes (Signed)
Patient ID: Kathleen Holt, female   DOB: 1955/05/13, 58 y.o.   MRN: [id number]   CC:  HPI: 58 year old female with a history of coronary artery disease status post 3 drug eluting stents placed in the yard in 2011, last EF of 45-50%, poorly controlled diabetic with uncontrolled A1c, despite being on lispro and Lantus, presents today for a followup. Patient checks her sugar once a day mostly fasting and it ranges between 130 to 160.she is inquiring about trulicity , to see  If she would be a candidate for this, and replace it with insulin. She does not have an endocrinologist. She denies any orthopnea, dependent edema, chest pain, shortness of breath. She does complain of blurry vision and has not seen an ophthalmologist in more than a year. She is also requesting a mammogram. She is requesting a TSH, vitamin D levels to be checked. She has history of diabetic neuropathy and is requesting prescription refill for 400 mg tablets instead of 300 mg tablets. She is fasting today, her CBGs 148   No Known Allergies Past Medical History  Diagnosis Date  . Diabetes mellitus without complication   . Coronary artery disease     a. s/p stenting 11/2009 [facility]  . Hyperlipidemia   . Diabetic neuropathy   . Ejection fraction      EF 45-50%,  echo, February 10, 2012, akinesis posterior lateral wall, diastolic dysfunction, mild mitral regurgitation,  . Mitral regurgitation     Mild, echo, January, 2014  . Stroke    Current Outpatient Prescriptions on File Prior to Visit  Medication Sig Dispense Refill  . aspirin 81 MG tablet Take 4 tablets (325 mg total) by mouth daily.      Marland Kitchen atorvastatin (LIPITOR) 40 MG tablet Take 1 tablet (40 mg total) by mouth daily.  90 tablet  3  . Insulin Pen Needle 31G X 5 MM MISC 1 Units by Does not apply route as directed.  100 each  5  . nitroGLYCERIN (NITROSTAT) 0.4 MG SL tablet Place 1 tablet (0.4 mg total) under the tongue every 5 (five)  minutes x 3 doses as needed for chest pain.  90 tablet  3  . Polyethyl Glycol-Propyl Glycol 0.4-0.3 % SOLN 1 drop in each eye every 3 hours as needed.  10 mL  12  . [DISCONTINUED] rosuvastatin (CRESTOR) 20 MG tablet Take 1 tablet (20 mg total) by mouth daily.  30 tablet  3   No current facility-administered medications on file prior to visit.   Family History  Problem Relation Age of Onset  . CAD Father     MI at age 65  . Diabetes Father   . Hypertension Father   . Cancer Father   . CAD Brother    History   Social History  . Marital Status: Married    Spouse Name: N/A    Number of Children: 3  . Years of Education: N/A   Occupational History  . Housewife    Social History Main Topics  . Smoking status: Never Smoker   . Smokeless tobacco: Never Used  . Alcohol Use: No  . Drug Use: No  . Sexual Activity: Yes    Birth Control/ Protection: Post-menopausal   Other Topics Concern  . Not on file   Social History Narrative  . No narrative on file    Review of Systems  Constitutional: Negative for fever, chills, diaphoresis, activity change, appetite change and fatigue.  HENT: Negative for ear  pain, nosebleeds, congestion, facial swelling, rhinorrhea, neck pain, neck stiffness and ear discharge.   Eyes: Negative for pain, discharge, redness, itching and visual disturbance.  Respiratory: Negative for cough, choking, chest tightness, shortness of breath, wheezing and stridor.   Cardiovascular: Negative for chest pain, palpitations and leg swelling.  Gastrointestinal: Negative for abdominal distention.  Genitourinary: Negative for dysuria, urgency, frequency, hematuria, flank pain, decreased urine volume, difficulty urinating and dyspareunia.  Musculoskeletal: Negative for back pain, joint swelling, arthralgias and gait problem.  Neurological: Negative for dizziness, tremors, seizures, syncope, facial asymmetry, speech difficulty, weakness, light-headedness, numbness and  headaches.  Hematological: Negative for adenopathy. Does not bruise/bleed easily.  Psychiatric/Behavioral: Negative for hallucinations, behavioral problems, confusion, dysphoric mood, decreased concentration and agitation.    Objective:   Filed Vitals:   05/07/13 1119  BP: 144/79  Pulse: 74  Temp: 97.9 F (36.6 C)  Resp: 20    Physical Exam  Constitutional: Appears well-developed and well-nourished. No distress.  HENT: Normocephalic. External right and left ear normal. Oropharynx is clear and moist.  Eyes: Conjunctivae and EOM are normal. PERRLA, no scleral icterus.  Neck: Normal ROM. Neck supple. No JVD. No tracheal deviation. No thyromegaly.  CVS: RRR, S1/S2 +, no murmurs, no gallops, no carotid bruit.  Pulmonary: Effort and breath sounds normal, no stridor, rhonchi, wheezes, rales.  Abdominal: Soft. BS +,  no distension, tenderness, rebound or guarding.  Musculoskeletal: Normal range of motion. No edema and no tenderness.  Lymphadenopathy: No lymphadenopathy noted, cervical, inguinal. Neuro: Alert. Normal reflexes, muscle tone coordination. No cranial nerve deficit. Skin: Skin is warm and dry. No rash noted. Not diaphoretic. No erythema. No pallor.  Psychiatric: Normal mood and affect. Behavior, judgment, thought content normal.   Lab Results  Component Value Date   WBC 7.2 02/11/2012   HGB 11.8* 02/11/2012   HCT 37.1 02/11/2012   MCV 70.4* 02/11/2012   PLT 306 02/11/2012   Lab Results  Component Value Date   CREATININE 0.81 04/14/2012   BUN 10 04/14/2012   NA 138 04/14/2012   K 4.6 04/14/2012   CL 101 04/14/2012   CO2 28 04/14/2012    Lab Results  Component Value Date   HGBA1C 10.1 05/07/2013   Lipid Panel     Component Value Date/Time   CHOL 122 04/14/2012 1051   TRIG 90 04/14/2012 1051   HDL 43 04/14/2012 1051   CHOLHDL 2.8 04/14/2012 1051   VLDL 18 04/14/2012 1051   LDLCALC 61 04/14/2012 1051       Assessment and plan:   Patient Active Problem List   Diagnosis  Date Noted  . S/P hysterectomy- still has cervix and ovaries 12/04/2012  . Diabetic retinopathy associated with type 2 diabetes mellitus 10/23/2012  . Osteoarthritis 10/22/2012  . Chronic anticoagulation 10/12/2012  . Vision, loss, sudden 06/06/2012  . Blurry vision, right eye 06/06/2012  . DM (diabetes mellitus), type 2 with neurological complications 60/63/0160  . Visual changes 06/06/2012  . Constipation 04/20/2012  . Coronary artery disease   . Ejection fraction   . Mitral regurgitation   . Diabetic retinopathy 04/14/2012  . Polyneuropathy in diabetes(357.2) 04/14/2012  . Anemia 02/11/2012  . Dyslipidemia, goal LDL below 100 02/11/2012   Diabetes type 2, poorly controlled Insulin-dependent Continue Lantus increased to 50 units at bedtime Decrease NovoLog to 10 units before each meal Patient encouraged to check a CBG 3 times a day Ophthalmology referral Endocrinology referral to discuss she is a candidate for trulicity Clinical pharmacy followup in  4 weeks   Hypertension, ischemic cardiomyopathy, coronary artery disease Continue Plavix, lisinopril, Coreg,  Diabetic polyneuropathy Refills gabapentin   Healthcare maintenance Mammogram Pap smear done in November 2014         The patient was given clear instructions to go to ER or return to medical center if symptoms don't improve, worsen or new problems develop. The patient verbalized understanding. The patient was told to call to get any lab results if not heard anything in the next week.

## 2013-05-08 LAB — VITAMIN D 25 HYDROXY (VIT D DEFICIENCY, FRACTURES): Vit D, 25-Hydroxy: 22 ng/mL — ABNORMAL LOW (ref 30–89)

## 2013-05-08 LAB — TSH: TSH: 2.36 u[IU]/mL (ref 0.350–4.500)

## 2013-05-17 ENCOUNTER — Other Ambulatory Visit: Payer: Self-pay | Admitting: *Deleted

## 2013-05-17 ENCOUNTER — Ambulatory Visit (INDEPENDENT_AMBULATORY_CARE_PROVIDER_SITE_OTHER): Payer: No Typology Code available for payment source | Admitting: Endocrinology

## 2013-05-17 ENCOUNTER — Encounter: Payer: Self-pay | Admitting: Endocrinology

## 2013-05-17 VITALS — BP 114/76 | HR 65 | Temp 97.9°F | Resp 16 | Ht 60.0 in | Wt 184.0 lb

## 2013-05-17 DIAGNOSIS — IMO0001 Reserved for inherently not codable concepts without codable children: Secondary | ICD-10-CM

## 2013-05-17 DIAGNOSIS — E1165 Type 2 diabetes mellitus with hyperglycemia: Principal | ICD-10-CM

## 2013-05-17 DIAGNOSIS — E785 Hyperlipidemia, unspecified: Secondary | ICD-10-CM

## 2013-05-17 DIAGNOSIS — E1149 Type 2 diabetes mellitus with other diabetic neurological complication: Secondary | ICD-10-CM

## 2013-05-17 LAB — GLUCOSE, POCT (MANUAL RESULT ENTRY): POC GLUCOSE: 125 mg/dL — AB (ref 70–99)

## 2013-05-17 MED ORDER — LIRAGLUTIDE 18 MG/3ML ~~LOC~~ SOPN: PEN_INJECTOR | SUBCUTANEOUS | Status: DC

## 2013-05-17 MED ORDER — ATORVASTATIN CALCIUM 40 MG PO TABS: 40.0000 mg | ORAL_TABLET | Freq: Every day | ORAL | Status: DC

## 2013-05-17 MED ORDER — METFORMIN HCL 1000 MG PO TABS: 1000.0000 mg | ORAL_TABLET | Freq: Two times a day (BID) | ORAL | Status: DC

## 2013-05-17 NOTE — Patient Instructions (Addendum)
Please check blood sugars at least half the time about 2 hours after any meal and every 2 days on waking up. Please bring blood sugar monitor to each visit  Continue Lantus at night Walk daily Add protein with dairy products/peanut butter/soybeans to each meal If sugar after lunch or dinner is over 200 regularly go up to [employer] VICTOZA injection with the sample pen once daily at the same time of the day preferably at bedtime.  Dial the dose to 0.6 mg for the first week.  You may  experience nausea in the first few days which usually gets better  After 1 week increase the dose to 1.2mg  daily if no nausea.  You may inject in the stomach, thigh or arm.   You will feel fullness of the stomach with starting the medication and should try to keep portions of food small.

## 2013-05-17 NOTE — Progress Notes (Signed)
Patient ID: Kathleen Holt, female   DOB: 08/29/1955, 58 y.o.   MRN: [id number]    Reason for Appointment: Consultation for Type 2 Diabetes  History of Present Illness:          Diagnosis: Type 2 diabetes mellitus, date of diagnosis: 2005        Past history: The diabetes was diagnosed about 10 years ago and she was probably treated with metformin and other oral agents for at least 6 years. Previously she had tried metformin, Januvia and Amaryl When she was admitted for coronary disease in 2011 in [place] she was switched to insulin and oral agents. Not clear how her control has been in the past She has been on Lantus and NovoLog insulin since 2011 She also had tried Byetta with her insulin for sometime but not clear if it was helping her  Recent history:  She has been referred here for management of poorly controlled diabetes with persistently high A1c She has not had any diabetes education in the past She follows a vegetarian high carbohydrate diet and usually has no dairy products or other protein in her diet Has been unable to lose weight She says she cannot walk because of leg and back discomfort and numbness in her feet Glucose monitoring: This is done only in the morning, did not bring her monitor for review today Insulin doses: She thinks her Lantus was increased from 30 up to 35 on her last visit 10 days ago She thinks she is taking her NovoLog consistently before each meal but does not adjust it based on what she is eating       Oral hypoglycemic drugs the patient is taking are: None      Side effects from medications have been: INSULIN regimen is described as:  Lantus 35 at bedtime, NovoLog 14 ac  tid  Glucose monitoring:  done one time a day         Glucometer: One Touch ultra.      Blood Glucose readings from recall: Fasting readings 120-140 usually Hypoglycemia:   none     Glycemic control:  Lab Results  Component Value Date   HGBA1C 10.1 05/07/2013   HGBA1C 10.5*  04/14/2012   Lab Results  Component Value Date   LDLCALC 55 05/07/2013   CREATININE 0.94 05/07/2013    Self-care: The diet that the patient has been following is: tries to limit carbohydrates.      Meals: 3 meals per day.          Exercise:          Dietician visit: Most recent:.               Compliance with the medical regimen:  Retinal exam: Most recent:.    Weight history: Wt Readings from Last 3 Encounters:  05/17/13 184 lb (83.462 kg)  05/07/13 186 lb (84.369 kg)  12/04/12 188 lb 11.2 oz (85.594 kg)      Medication List       This list is accurate as of: 05/17/13  9:15 AM.  Always use your most recent med list.               aspirin 81 MG tablet  Take 4 tablets (325 mg total) by mouth daily.     atorvastatin 40 MG tablet  Commonly known as:  LIPITOR  Take 1 tablet (40 mg total) by mouth daily.     carvedilol 3.125 MG tablet  Commonly known as:  COREG  Take 1 tablet (3.125 mg total) by mouth 2 (two) times daily with a meal.     clopidogrel 75 MG tablet  Commonly known as:  PLAVIX  Take 1 tablet (75 mg total) by mouth daily.     gabapentin 300 MG capsule  Commonly known as:  NEURONTIN  Take 1 capsule (300 mg total) by mouth 3 (three) times daily.     insulin aspart 100 UNIT/ML FlexPen  Commonly known as:  NOVOLOG  Inject 14 Units into the skin 3 (three) times daily with meals.     Insulin Glargine 100 UNIT/ML Solostar Pen  Commonly known as:  LANTUS  Inject 35 Units into the skin at bedtime.     Insulin Pen Needle 31G X 5 MM Misc  1 Units by Does not apply route as directed.     lisinopril 2.5 MG tablet  Commonly known as:  PRINIVIL,ZESTRIL  Take 1 tablet (2.5 mg total) by mouth daily.     nitroGLYCERIN 0.4 MG SL tablet  Commonly known as:  NITROSTAT  Place 1 tablet (0.4 mg total) under the tongue every 5 (five) minutes x 3 doses as needed for chest pain.     pantoprazole 40 MG tablet  Commonly known as:  PROTONIX  Take 1 tablet (40 mg total)  by mouth daily.     Polyethyl Glycol-Propyl Glycol 0.4-0.3 % Soln  1 drop in each eye every 3 hours as needed.        Allergies: No Known Allergies  Past Medical History  Diagnosis Date  . Diabetes mellitus without complication   . Coronary artery disease     a. s/p stenting 11/2009 [facility]  . Hyperlipidemia   . Diabetic neuropathy   . Ejection fraction      EF 45-50%,  echo, February 10, 2012, akinesis posterior lateral wall, diastolic dysfunction, mild mitral regurgitation,  . Mitral regurgitation     Mild, echo, January, 2014  . Stroke     Past Surgical History  Procedure Laterality Date  . Eye surgery    . Abdominal hysterectomy      Family History  Problem Relation Age of Onset  . CAD Father     MI at age 23  . Diabetes Father   . Hypertension Father   . Cancer Father   . CAD Brother     Social History:  reports that she has never smoked. She has never used smokeless tobacco. She reports that she does not drink alcohol or use illicit drugs.    Review of Systems       Lipids: On atorvastatin since her hospitalization for coronary artery disease in 2011      Lab Results  Component Value Date   CHOL 113 05/07/2013   HDL 40 05/07/2013   LDLCALC 55 05/07/2013   TRIG 91 05/07/2013   CHOLHDL 2.8 05/07/2013       Her vision is decreased in the right eye, has had cataracts removed.         Skin: No rash or infections     Thyroid:  No  unusual fatigue.     The blood pressure has been normal, is only on 2.5 mg lisinopril and low-dose Coreg     No swelling of feet.     No shortness of breath on exertion.      No recent chest pain. No leg pain on walking in the calf  Bowel habits: Chronic constipation, has hard stools       No frequency of urination or nocturia       No joint  pains. Has had low back pain      No  depression           She has a long-standing history of Numbness, tingling in her feet treated with  gabapentin    LABS:  Office Visit on 05/17/2013  Component Date Value Ref Range Status  . POC Glucose 05/17/2013 125* 70 - 99 mg/dl Final    Physical Examination:  BP 114/76  Pulse 65  Temp(Src) 97.9 F (36.6 C)  Resp 16  Ht 5' (1.524 m)  Wt 184 lb (83.462 kg)  BMI 35.94 kg/m2  SpO2 97%  GENERAL:         Patient has generalized obesity.   HEENT:         Eye exam shows normal external appearance. Fundus exam shows no retinopathy. Oral exam shows normal mucosa .  NECK:         General:  Neck exam shows no lymphadenopathy. Carotids are normal to palpation and no bruit heard. Thyroid is not enlarged and no nodules felt.   LUNGS:         Chest is symmetrical. Lungs are clear to auscultation.Marland Kitchen   HEART:         Heart sounds:  S1 and S2 are normal. No murmurs or clicks heard., no S3 or S4.   ABDOMEN:   There is no distention present. Liver and spleen are not palpable. No other mass or tenderness present.  EXTREMITIES:     There is no edema. No skin lesions present.Marland Kitchen  NEUROLOGICAL:   Vibration sense is mildly reduced in the right and moderately on the left toes.  Ankle jerks are absent bilaterally.          Diabetic foot exam:  as in the foot exam section MUSCULOSKELETAL:       There is no enlargement or deformity of the joints. Spine is normal to inspection.Marland Kitchen   SKIN:       No rash. She has acanthosis present       ASSESSMENT:  Diabetes type 2, uncontrolled with obesity    She is currently on basal bolus insulin regimen with persistently high A1c over 10% Most likely this is related to higher postprandial readings which she is not checking Her diet is unbalanced with relatively high carbohydrate intake and very little protein Also unable to do any exercise Most likely does need larger doses of NovoLog to cover her meals Insulin resistance: Most likely has this related to her ethnic origin, marked obesity and evidence of acanthosis. She should be able to benefit from at least  starting metformin and consider Actos also Also is a good candidate for a GLP-1 drug which would help postprandial readings and weight loss. She is asking about Trulicity but most likely this is on be available on her [place] health plan  Complications: Peripheral neuropathy mild sensory loss and paresthesias Unknown status of retinopathy and nephropathy  PLAN:   Start checking readings after meals  Improve diet with more balanced meals using protein at each meal as discussed and reduce carbohydrates  Start walking daily, even if for 5-10 minutes. Also consider chair exercises  If blood sugars 2 hours after a meal are consistently over 200 increase NovoLog to 18 units Discussed with the patient the nature of GLP-1 drugs, the action on various organ  systems, how they benefit blood glucose control, as well as the benefit of weight loss and  increase satiety . Explained possible side effects especially nausea and vomiting; discussed safety information in package insert. Described injection technique and dosage titration of Victoza  starting with 0.6 mg once a day at the same time for the first week and then increasing to 1.2 mg if no symptoms of nausea. Patient brochure on Victoza and co-pay card given Followup in 3 weeks and review home glucose monitoring with download Trial of metformin 1000 mg twice a day  Urine microalbumin to be checked  Total visit time including counseling = 50 minutes  Betzalel Umbarger 05/17/2013, 9:15 AM

## 2013-06-01 ENCOUNTER — Ambulatory Visit: Payer: Self-pay | Admitting: Internal Medicine

## 2013-06-02 ENCOUNTER — Telehealth: Payer: Self-pay | Admitting: Endocrinology

## 2013-06-02 NOTE — Telephone Encounter (Signed)
Please see below, FYI

## 2013-06-02 NOTE — Telephone Encounter (Signed)
Pt is also receiving insulin from the community [facility] MD Dr. Allyson Sabal. Pharmacist just wnted to make sure you were aware.

## 2013-06-03 ENCOUNTER — Ambulatory Visit: Payer: No Typology Code available for payment source | Admitting: Home Health Services

## 2013-06-03 ENCOUNTER — Ambulatory Visit: Payer: No Typology Code available for payment source | Admitting: Pharmacist

## 2013-06-08 ENCOUNTER — Encounter: Payer: Self-pay | Admitting: Endocrinology

## 2013-06-08 ENCOUNTER — Ambulatory Visit (INDEPENDENT_AMBULATORY_CARE_PROVIDER_SITE_OTHER): Payer: No Typology Code available for payment source | Admitting: Endocrinology

## 2013-06-08 VITALS — BP 118/72 | HR 81 | Temp 98.1°F | Resp 14 | Ht 60.0 in | Wt 179.8 lb

## 2013-06-08 DIAGNOSIS — E1149 Type 2 diabetes mellitus with other diabetic neurological complication: Secondary | ICD-10-CM

## 2013-06-08 NOTE — Progress Notes (Signed)
Patient ID: Kathleen Holt, female   DOB: January 19, 1956, 58 y.o.   MRN: [id number]    Reason for Appointment: Consultation for Type 2 Diabetes  History of Present Illness:          Diagnosis: Type 2 diabetes mellitus, date of diagnosis: 2005        Past history: The diabetes was diagnosed about 10 years ago and she was probably treated with metformin and other oral agents for at least 6 years. Previously she had tried metformin, Januvia and Amaryl When she was admitted for coronary disease in 2011 in [place] she was switched to insulin and oral agents. Not clear how her control has been in the past She has been on Lantus and NovoLog insulin since 2011 She also had tried Byetta with her insulin for sometime but not clear if it was helping her   Recent history:  Because of her poor control and persistently high A1c in 04/2013 her metformin was increased to maximum dose. Also she was started on Victoza which he titrated to 1.2 mg. She was told to increase her NovoLog if she was having postprandial hyperglycemia Current blood sugar patterns are difficult to analyze because of her meter having the wrong date and time However she has had overall better blood sugars especially in the mornings and she has reduced her LANTUS by 3 units and fasting readings appear to be mostly near normal She still has sporadically high readings after meals She has had some abdominal distress and decreased appetite with 1.2 mg Victoza and on some days is not able to eat much at all Diet: She still has not added protein to her meals and has not changed her diet as directed, still eating mostly high carbohydrate meals Also unable to exercise much because of leg discomfort/back pain/numbness in feet Has been being able to lose weight She says she cannot walk because of leg and back discomfort and numbness in her feet Insulin doses: Lantus 32, NovoLog 14-20 AC based on meal size       Oral hypoglycemic drugs the patient  is taking are: None      Side effects from medications have been: INSULIN regimen is described as:  Lantus 32 at bedtime, NovoLog 14 ac  tid  Glucose monitoring:  done one time a day         Glucometer: One Touch ultra.      Blood Glucose readings from recall: Fasting readings recently 73-121 PC breakfast 307. Afternoon/Evening 76-313 with 2 readings over 300 Able to get blood sugar pattern since she had the wrong date and time on her monitor  Hypoglycemia: Minimal    Glycemic control:  Lab Results  Component Value Date   HGBA1C 10.1 05/07/2013   HGBA1C 10.5* 04/14/2012   Lab Results  Component Value Date   LDLCALC 55 05/07/2013   CREATININE 0.94 05/07/2013    Self-care: The diet that the patient has been following is: tries to limit carbohydrates.      Meals: 3 meals per day.          Exercise: none          Dietician visit: Most recent:.               Compliance with the medical regimen:  Retinal exam: Most recent: optometrist   Weight history: Wt Readings from Last 3 Encounters:  06/08/13 179 lb 12.8 oz (81.557 kg)  05/17/13 184 lb (83.462 kg)  05/07/13 186 lb (84.369 kg)  Medication List       This list is accurate as of: 06/08/13 10:00 AM.  Always use your most recent med list.               aspirin 81 MG tablet  Take 4 tablets (325 mg total) by mouth daily.     atorvastatin 40 MG tablet  Commonly known as:  LIPITOR  Take 1 tablet (40 mg total) by mouth daily.     carvedilol 3.125 MG tablet  Commonly known as:  COREG  Take 1 tablet (3.125 mg total) by mouth 2 (two) times daily with a meal.     clopidogrel 75 MG tablet  Commonly known as:  PLAVIX  Take 1 tablet (75 mg total) by mouth daily.     gabapentin 300 MG capsule  Commonly known as:  NEURONTIN  Take 1 capsule (300 mg total) by mouth 3 (three) times daily.     insulin aspart 100 UNIT/ML FlexPen  Commonly known as:  NOVOLOG  Inject 14 Units into the skin 3 (three) times daily with meals.      Insulin Glargine 100 UNIT/ML Solostar Pen  Commonly known as:  LANTUS  Inject 35 Units into the skin at bedtime.     Insulin Pen Needle 31G X 5 MM Misc  1 Units by Does not apply route as directed.     Liraglutide 18 MG/3ML Sopn  1.2 mg.     lisinopril 2.5 MG tablet  Commonly known as:  PRINIVIL,ZESTRIL  Take 1 tablet (2.5 mg total) by mouth daily.     metFORMIN 1000 MG tablet  Commonly known as:  GLUCOPHAGE  Take 1 tablet (1,000 mg total) by mouth 2 (two) times daily with a meal.     nitroGLYCERIN 0.4 MG SL tablet  Commonly known as:  NITROSTAT  Place 1 tablet (0.4 mg total) under the tongue every 5 (five) minutes x 3 doses as needed for chest pain.     pantoprazole 40 MG tablet  Commonly known as:  PROTONIX  Take 1 tablet (40 mg total) by mouth daily.     Polyethyl Glycol-Propyl Glycol 0.4-0.3 % Soln  1 drop in each eye every 3 hours as needed.        Allergies: No Known Allergies  Past Medical History  Diagnosis Date  . Diabetes mellitus without complication   . Coronary artery disease     a. s/p stenting 11/2009 [facility]  . Hyperlipidemia   . Diabetic neuropathy   . Ejection fraction      EF 45-50%,  echo, February 10, 2012, akinesis posterior lateral wall, diastolic dysfunction, mild mitral regurgitation,  . Mitral regurgitation     Mild, echo, January, 2014  . Stroke     Past Surgical History  Procedure Laterality Date  . Eye surgery    . Abdominal hysterectomy      Family History  Problem Relation Age of Onset  . CAD Father     MI at age 87  . Diabetes Father   . Hypertension Father   . Cancer Father   . CAD Brother     Social History:  reports that she has never smoked. She has never used smokeless tobacco. She reports that she does not drink alcohol or use illicit drugs.    Review of Systems       Lipids: On atorvastatin since her hospitalization for coronary artery disease in 2011      Lab Results  Component Value Date   CHOL 113 05/07/2013   HDL 40 05/07/2013   LDLCALC 55 05/07/2013   TRIG 91 05/07/2013   CHOLHDL 2.8 05/07/2013        She has a long-standing history of Numbness, tingling in her feet treated with gabapentin    LABS:    Physical Examination:  BP 118/72  Pulse 81  Temp(Src) 98.1 F (36.7 C)  Resp 14  Ht 5' (1.524 m)  Wt 179 lb 12.8 oz (81.557 kg)  BMI 35.11 kg/m2  SpO2 95%       ASSESSMENT:  Diabetes type 2, uncontrolled with obesity  She has had some improvement in her blood sugars with starting Victoza and also increasing her metformin She appears to have had a significant increase in appetite and some weight loss; she thinks she is sometimes anorexic with 1.0 mg Victoza   She is currently using an old One Touch monitor and will not be able to get strips for this through the health  department She is still not adding protein to meals and again reviewed sources of protein in her vegetarian diet  PLAN:   Start  regarding blood sugars in the diary that was given. She can use a generic monitor. Will try to find out what is covered by her plan  Continue taking some readings after meals  Try going back to 0.6 mg Victoza  Continue metformin unchanged  Better meal planning with balanced amounts of carbohydrates and protein   Improve diet with more balanced meals using protein at each meal as discussed and reduce carbohydrates  Start walking daily, even if for 5-10 minutes. Also consider chair exercises May reduce Lantus another 2 units of morning glucose stays below 100   Urine microalbumin to be checked   Elayne Snare 06/08/2013, 10:00 AM

## 2013-06-08 NOTE — Patient Instructions (Signed)
Victoza 0.6mg  daily   Reduce Lantus to 30 units and if sugar in am < 100 reduce to 28  Novolog 12-16 units  More protein at meals  Please check blood sugars at least half the time about 2 hours after any meal and 3x weekly on waking up. Please bring blood sugar monitor to each visit

## 2013-06-09 ENCOUNTER — Other Ambulatory Visit: Payer: Self-pay | Admitting: *Deleted

## 2013-06-09 ENCOUNTER — Telehealth: Payer: Self-pay | Admitting: *Deleted

## 2013-06-09 DIAGNOSIS — E1149 Type 2 diabetes mellitus with other diabetic neurological complication: Secondary | ICD-10-CM

## 2013-06-09 MED ORDER — INSULIN GLARGINE 100 UNIT/ML SOLOSTAR PEN: PEN_INJECTOR | SUBCUTANEOUS | Status: DC

## 2013-06-09 MED ORDER — INSULIN ASPART 100 UNIT/ML FLEXPEN: PEN_INJECTOR | SUBCUTANEOUS | Status: DC

## 2013-06-09 NOTE — Telephone Encounter (Signed)
This does not matter

## 2013-06-09 NOTE — Telephone Encounter (Signed)
Kathleen Holt from the [facility] called, she wanted to make sure you knew that she was getting her insulin through her PCP as well.  Please acknowledge.

## 2013-07-19 ENCOUNTER — Other Ambulatory Visit (INDEPENDENT_AMBULATORY_CARE_PROVIDER_SITE_OTHER): Payer: No Typology Code available for payment source

## 2013-07-19 DIAGNOSIS — IMO0001 Reserved for inherently not codable concepts without codable children: Secondary | ICD-10-CM

## 2013-07-19 DIAGNOSIS — E1165 Type 2 diabetes mellitus with hyperglycemia: Principal | ICD-10-CM

## 2013-07-19 LAB — MICROALBUMIN / CREATININE URINE RATIO
Creatinine,U: 124.6 mg/dL
MICROALB UR: 1.1 mg/dL (ref 0.0–1.9)
Microalb Creat Ratio: 0.9 mg/g (ref 0.0–30.0)

## 2013-07-19 LAB — BASIC METABOLIC PANEL
BUN: 16 mg/dL (ref 6–23)
CHLORIDE: 104 meq/L (ref 96–112)
CO2: 26 mEq/L (ref 19–32)
Calcium: 8.9 mg/dL (ref 8.4–10.5)
Creatinine, Ser: 1.1 mg/dL (ref 0.4–1.2)
GFR: 56.77 mL/min — AB (ref >60.00)
Glucose, Bld: 129 mg/dL — ABNORMAL HIGH (ref 70–99)
POTASSIUM: 4.8 meq/L (ref 3.5–5.1)
SODIUM: 138 meq/L (ref 135–145)

## 2013-07-21 LAB — FRUCTOSAMINE: FRUCTOSAMINE: 238 umol/L (ref 190–270)

## 2013-07-22 ENCOUNTER — Ambulatory Visit (INDEPENDENT_AMBULATORY_CARE_PROVIDER_SITE_OTHER): Payer: No Typology Code available for payment source | Admitting: Endocrinology

## 2013-07-22 ENCOUNTER — Encounter: Payer: Self-pay | Admitting: Endocrinology

## 2013-07-22 VITALS — BP 132/82 | HR 80 | Temp 98.1°F | Resp 16 | Ht 60.0 in | Wt 184.6 lb

## 2013-07-22 DIAGNOSIS — E785 Hyperlipidemia, unspecified: Secondary | ICD-10-CM

## 2013-07-22 DIAGNOSIS — E1149 Type 2 diabetes mellitus with other diabetic neurological complication: Secondary | ICD-10-CM

## 2013-07-22 DIAGNOSIS — E559 Vitamin D deficiency, unspecified: Secondary | ICD-10-CM

## 2013-07-22 MED ORDER — LIRAGLUTIDE 18 MG/3ML ~~LOC~~ SOPN: 1.2000 mg | PEN_INJECTOR | Freq: Every day | SUBCUTANEOUS | Status: DC

## 2013-07-22 MED ORDER — PREGABALIN 100 MG PO CAPS: 100.0000 mg | ORAL_CAPSULE | Freq: Two times a day (BID) | ORAL | Status: DC

## 2013-07-22 NOTE — Progress Notes (Signed)
Patient ID: Kathleen Holt, female   DOB: May 30, 1955, 58 y.o.   MRN: [id number]    Reason for Appointment: for Type 2 Diabetes  History of Present Illness:          Diagnosis: Type 2 diabetes mellitus, date of diagnosis: 2005        Past history: The diabetes was diagnosed about 10 years ago and she was probably treated with metformin and other oral agents for at least 6 years. Previously she had tried metformin, Januvia and Amaryl When she was admitted for coronary disease in 2011 in [place] she was switched to insulin and oral agents. Not clear how her control has been in the past She has been on Lantus and NovoLog insulin since 2011 She also had tried Byetta with her insulin for sometime but not clear if it was helping her   Recent history:  INSULIN regimen is described as:  Lantus 30 at bedtime, NovoLog 14 ac  tid  Because of her poor control and persistently high A1c of 10.1% in 04/2013 her metformin was increased to maximum dose. Also she was started on Victoza which she was unable to tolerate the 1.2 mg dosage because of decreased appetite and abdominal distress but is doing fairly well with 0.6 mg Also since she tends to have high postprandial readings her NovoLog was increased She is again using a generic monitor but recent blood sugars appear to be fairly good overall Also her fructosamine is well within the normal range She has however gained some weight because of lack of exercise and relatively higher dose of insulin She says she cannot walk because of leg and back discomfort and numbness in her feet She thinks she may feel a little hypoglycemic after breakfast and blood sugars are relatively lower at that time probably from eating smaller meals in the morning  Diet: She has been asked to add protein to her meals and has not consistently changed her diet as directed, still eating relatively high carbohydrate meals Also unable to exercise much because of leg discomfort/back  pain/numbness in feet Has been being able to lose weight       Oral hypoglycemic drugs the patient is taking are: None      Side effects from medications have been:  Glucose monitoring:  done once or twice a day        Glucometer: 2 result Blood Glucose readings from monitored review: Fasting readings recently 102, 121, after breakfast 90 2 PM 177, evening 168 Overall average 127 for 30 days  Glycemic control:  Fructosamine 238  Lab Results  Component Value Date   HGBA1C 10.1 05/07/2013   HGBA1C 10.5* 04/14/2012   Lab Results  Component Value Date   MICROALBUR 1.1 07/19/2013   LDLCALC 55 05/07/2013   CREATININE 1.1 07/19/2013    Self-care: The diet that the patient has been following is: tries to limit carbohydrates.      Meals: 3 meals per day.          Exercise: none          Dietician visit: Most recent:.               Compliance with the medical regimen:  Retinal exam: Most recent: optometrist   Weight history: Wt Readings from Last 3 Encounters:  07/22/13 184 lb 9.6 oz (83.734 kg)  06/08/13 179 lb 12.8 oz (81.557 kg)  05/17/13 184 lb (83.462 kg)     Appointment on 07/19/2013  Component Date  Value Ref Range Status  . Sodium 07/19/2013 138  135 - 145 mEq/L Final  . Potassium 07/19/2013 4.8  3.5 - 5.1 mEq/L Final  . Chloride 07/19/2013 104  96 - 112 mEq/L Final  . CO2 07/19/2013 26  19 - 32 mEq/L Final  . Glucose, Bld 07/19/2013 129* 70 - 99 mg/dL Final  . BUN 07/19/2013 16  6 - 23 mg/dL Final  . Creatinine, Ser 07/19/2013 1.1  0.4 - 1.2 mg/dL Final  . Calcium 07/19/2013 8.9  8.4 - 10.5 mg/dL Final  . GFR 07/19/2013 56.77* >60.00 mL/min Final  . Fructosamine 07/19/2013 238  190 - 270 umol/L Final  . Microalb, Ur 07/19/2013 1.1  0.0 - 1.9 mg/dL Final  . Creatinine,U 07/19/2013 124.6 Verified by manual dilution.   Final  . Microalb Creat Ratio 07/19/2013 0.9  0.0 - 30.0 mg/g Final      Medication List       This list is accurate as of: 07/22/13  1:07 PM.   Always use your most recent med list.               aspirin 81 MG tablet  Take 4 tablets (325 mg total) by mouth daily.     atorvastatin 40 MG tablet  Commonly known as:  LIPITOR  Take 1 tablet (40 mg total) by mouth daily.     carvedilol 3.125 MG tablet  Commonly known as:  COREG  Take 1 tablet (3.125 mg total) by mouth 2 (two) times daily with a meal.     clopidogrel 75 MG tablet  Commonly known as:  PLAVIX  Take 1 tablet (75 mg total) by mouth daily.     gabapentin 300 MG capsule  Commonly known as:  NEURONTIN  Take 1 capsule (300 mg total) by mouth 3 (three) times daily.     insulin aspart 100 UNIT/ML FlexPen  Commonly known as:  NOVOLOG  Inject 12-16 units three times a day depending on meal     Insulin Glargine 100 UNIT/ML Solostar Pen  Commonly known as:  LANTUS  Inject 30 units daily, if am sugar is < 100 reduce to 28 units     Insulin Pen Needle 31G X 5 MM Misc  1 Units by Does not apply route as directed.     Liraglutide 18 MG/3ML Sopn  1.2 mg.     lisinopril 2.5 MG tablet  Commonly known as:  PRINIVIL,ZESTRIL  Take 1 tablet (2.5 mg total) by mouth daily.     metFORMIN 1000 MG tablet  Commonly known as:  GLUCOPHAGE  Take 1 tablet (1,000 mg total) by mouth 2 (two) times daily with a meal.     nitroGLYCERIN 0.4 MG SL tablet  Commonly known as:  NITROSTAT  Place 1 tablet (0.4 mg total) under the tongue every 5 (five) minutes x 3 doses as needed for chest pain.     pantoprazole 40 MG tablet  Commonly known as:  PROTONIX  Take 1 tablet (40 mg total) by mouth daily.     Polyethyl Glycol-Propyl Glycol 0.4-0.3 % Soln  1 drop in each eye every 3 hours as needed.        Allergies: No Known Allergies  Past Medical History  Diagnosis Date  . Diabetes mellitus without complication   . Coronary artery disease     a. s/p stenting 11/2009 [facility]  . Hyperlipidemia   . Diabetic neuropathy   . Ejection fraction  EF  45-50%,  echo, February 10, 2012, akinesis posterior lateral wall, diastolic dysfunction, mild mitral regurgitation,  . Mitral regurgitation     Mild, echo, January, 2014  . Stroke     Past Surgical History  Procedure Laterality Date  . Eye surgery    . Abdominal hysterectomy      Family History  Problem Relation Age of Onset  . CAD Father     MI at age 74  . Diabetes Father   . Hypertension Father   . Cancer Father   . CAD Brother     Social History:  reports that she has never smoked. She has never used smokeless tobacco. She reports that she does not drink alcohol or use illicit drugs.    Review of Systems       Lipids: On atorvastatin since her hospitalization for coronary artery disease in 2011 with good results      Lab Results  Component Value Date   CHOL 113 05/07/2013   HDL 40 05/07/2013   LDLCALC 55 05/07/2013   TRIG 91 05/07/2013   CHOLHDL 2.8 05/07/2013        She has a long-standing history of Numbness, tingling in her feet treated with gabapentin   No recent swelling of her feet    Physical Examination:  BP 132/82  Pulse 80  Temp(Src) 98.1 F (36.7 C)  Resp 16  Ht 5' (1.524 m)  Wt 184 lb 9.6 oz (83.734 kg)  BMI 36.05 kg/m2  SpO2 97%       ASSESSMENT:  Diabetes type 2, uncontrolled with obesity  She has had  improvement in her blood sugars with starting Victoza and also increasing her metformin and mealtime insulin Compared to a baseline A1c of over 10% her blood sugars are overall now well controlled and the fructosamine is excellent However has not been able to lose weight Tolerating Victoza 0.6 and probably benefiting from this dose also  Complications: Neuropathy, no evidence of nephropathy  PLAN:   She is currently using a generic monitor and will need to investigate which meter will be covered by the health Department  She needs to have consistent amount of protein in her vegetarian diet  Start  regarding blood sugars in the diary  that was given. She can use a generic monitor. Will try to find out what is covered by her plan  Needs to be taking more readings after meals  Reduce breakfast covers to 8 units, discussed blood sugar target after meals  Start walking daily, even if for 5-10 minutes. Also given chair exercises to do 15 minutes a day May reduce Lantus another 2 units of morning glucose stays below 100   Check A1c on the next visit   Counseling time over 50% of today's 25 minute visit   Jayvon Mounger 07/22/2013, 1:07 PM

## 2013-07-22 NOTE — Patient Instructions (Addendum)
Novolog 8 units At breakfast, same doses other times  Get one of the following: One touch, Bayer, Accucheck or Freestyle meters

## 2013-07-24 DIAGNOSIS — E559 Vitamin D deficiency, unspecified: Secondary | ICD-10-CM | POA: Insufficient documentation

## 2013-08-03 ENCOUNTER — Other Ambulatory Visit: Payer: Self-pay | Admitting: *Deleted

## 2013-08-06 ENCOUNTER — Ambulatory Visit: Payer: No Typology Code available for payment source | Attending: Internal Medicine | Admitting: Internal Medicine

## 2013-08-06 ENCOUNTER — Encounter: Payer: Self-pay | Admitting: Internal Medicine

## 2013-08-06 VITALS — BP 134/82 | HR 87 | Temp 98.0°F | Resp 16

## 2013-08-06 DIAGNOSIS — I10 Essential (primary) hypertension: Secondary | ICD-10-CM

## 2013-08-06 DIAGNOSIS — E139 Other specified diabetes mellitus without complications: Secondary | ICD-10-CM

## 2013-08-06 DIAGNOSIS — I209 Angina pectoris, unspecified: Secondary | ICD-10-CM

## 2013-08-06 DIAGNOSIS — E119 Type 2 diabetes mellitus without complications: Secondary | ICD-10-CM | POA: Insufficient documentation

## 2013-08-06 DIAGNOSIS — E089 Diabetes mellitus due to underlying condition without complications: Secondary | ICD-10-CM

## 2013-08-06 DIAGNOSIS — I2510 Atherosclerotic heart disease of native coronary artery without angina pectoris: Secondary | ICD-10-CM

## 2013-08-06 DIAGNOSIS — H538 Other visual disturbances: Secondary | ICD-10-CM

## 2013-08-06 DIAGNOSIS — I25119 Atherosclerotic heart disease of native coronary artery with unspecified angina pectoris: Secondary | ICD-10-CM

## 2013-08-06 DIAGNOSIS — Z7901 Long term (current) use of anticoagulants: Secondary | ICD-10-CM | POA: Insufficient documentation

## 2013-08-06 DIAGNOSIS — E785 Hyperlipidemia, unspecified: Secondary | ICD-10-CM | POA: Insufficient documentation

## 2013-08-06 LAB — POCT GLYCOSYLATED HEMOGLOBIN (HGB A1C): Hemoglobin A1C: 8

## 2013-08-06 LAB — GLUCOSE, POCT (MANUAL RESULT ENTRY): POC Glucose: 263 mg/dl — AB (ref 70–99)

## 2013-08-06 MED ORDER — PREGABALIN 100 MG PO CAPS: 100.0000 mg | ORAL_CAPSULE | Freq: Two times a day (BID) | ORAL | Status: DC

## 2013-08-06 MED ORDER — CARVEDILOL 3.125 MG PO TABS: 3.1250 mg | ORAL_TABLET | Freq: Two times a day (BID) | ORAL | Status: DC

## 2013-08-06 MED ORDER — METFORMIN HCL 1000 MG PO TABS: 1000.0000 mg | ORAL_TABLET | Freq: Two times a day (BID) | ORAL | Status: DC

## 2013-08-06 MED ORDER — CLOPIDOGREL BISULFATE 75 MG PO TABS: 75.0000 mg | ORAL_TABLET | Freq: Every day | ORAL | Status: DC

## 2013-08-06 MED ORDER — PANTOPRAZOLE SODIUM 40 MG PO TBEC: 40.0000 mg | DELAYED_RELEASE_TABLET | Freq: Every day | ORAL | Status: DC

## 2013-08-06 MED ORDER — LISINOPRIL 2.5 MG PO TABS: 2.5000 mg | ORAL_TABLET | Freq: Every day | ORAL | Status: DC

## 2013-08-06 MED ORDER — INSULIN ASPART 100 UNIT/ML FLEXPEN: PEN_INJECTOR | SUBCUTANEOUS | Status: DC

## 2013-08-06 MED ORDER — ATORVASTATIN CALCIUM 40 MG PO TABS: 40.0000 mg | ORAL_TABLET | Freq: Every day | ORAL | Status: DC

## 2013-08-06 MED ORDER — NITROGLYCERIN 0.4 MG SL SUBL: 0.4000 mg | SUBLINGUAL_TABLET | SUBLINGUAL | Status: DC | PRN

## 2013-08-06 MED ORDER — INSULIN GLARGINE 100 UNIT/ML SOLOSTAR PEN
PEN_INJECTOR | SUBCUTANEOUS | Status: DC
Start: 2013-08-06 — End: 2013-10-13

## 2013-08-06 NOTE — Progress Notes (Signed)
MRN: [id number] Name: Kathleen Holt  Sex: female Age: 58 y.o. DOB: [birth year]  Allergies: Review of patient's allergies indicates no known allergies.  Chief Complaint  Patient presents with  . Follow-up    HPI: Patient is 58 y.o. female who has to of CAD hypertension diabetes comes today for followup, her hemoglobin A1c is improving she is following with endocrinologist, currently denies any chest and shortness of breath, she reported to have blurry vision especially in the right eye and patient has not seen ophthalmologist for more than 1 year. Patient is requesting refill on the medications.  Past Medical History  Diagnosis Date  . Diabetes mellitus without complication   . Coronary artery disease     a. s/p stenting 11/2009 [facility]  . Hyperlipidemia   . Diabetic neuropathy   . Ejection fraction      EF 45-50%,  echo, February 10, 2012, akinesis posterior lateral wall, diastolic dysfunction, mild mitral regurgitation,  . Mitral regurgitation     Mild, echo, January, 2014  . Stroke     Past Surgical History  Procedure Laterality Date  . Eye surgery    . Abdominal hysterectomy        Medication List       This list is accurate as of: 08/06/13 11:58 AM.  Always use your most recent med list.               aspirin 81 MG tablet  Take 4 tablets (325 mg total) by mouth daily.     atorvastatin 40 MG tablet  Commonly known as:  LIPITOR  Take 1 tablet (40 mg total) by mouth daily.     carvedilol 3.125 MG tablet  Commonly known as:  COREG  Take 1 tablet (3.125 mg total) by mouth 2 (two) times daily with a meal.     clopidogrel 75 MG tablet  Commonly known as:  PLAVIX  Take 1 tablet (75 mg total) by mouth daily.     insulin aspart 100 UNIT/ML FlexPen  Commonly known as:  NOVOLOG  Inject 12-16 units three times a day depending on meal     Insulin Glargine 100 UNIT/ML Solostar Pen  Commonly known as:  LANTUS  Inject 30 units  daily, if am sugar is < 100 reduce to 28 units     Insulin Pen Needle 31G X 5 MM Misc  1 Units by Does not apply route as directed.     Liraglutide 18 MG/3ML Sopn  Inject 1.2 mg into the skin daily.     lisinopril 2.5 MG tablet  Commonly known as:  PRINIVIL,ZESTRIL  Take 1 tablet (2.5 mg total) by mouth daily.     metFORMIN 1000 MG tablet  Commonly known as:  GLUCOPHAGE  Take 1 tablet (1,000 mg total) by mouth 2 (two) times daily with a meal.     nitroGLYCERIN 0.4 MG SL tablet  Commonly known as:  NITROSTAT  Place 1 tablet (0.4 mg total) under the tongue every 5 (five) minutes x 3 doses as needed for chest pain.     pantoprazole 40 MG tablet  Commonly known as:  PROTONIX  Take 1 tablet (40 mg total) by mouth daily.     Polyethyl Glycol-Propyl Glycol 0.4-0.3 % Soln  1 drop in each eye every 3 hours as needed.     pregabalin 100 MG capsule  Commonly known as:  LYRICA  Take 1 capsule (100 mg total) by mouth 2 (two) times  daily.        Meds ordered this encounter  Medications  . atorvastatin (LIPITOR) 40 MG tablet    Sig: Take 1 tablet (40 mg total) by mouth daily.    Dispense:  90 tablet    Refill:  3  . carvedilol (COREG) 3.125 MG tablet    Sig: Take 1 tablet (3.125 mg total) by mouth 2 (two) times daily with a meal.    Dispense:  90 tablet    Refill:  3  . clopidogrel (PLAVIX) 75 MG tablet    Sig: Take 1 tablet (75 mg total) by mouth daily.    Dispense:  90 tablet    Refill:  3  . insulin aspart (NOVOLOG) 100 UNIT/ML FlexPen    Sig: Inject 12-16 units three times a day depending on meal    Dispense:  15 mL    Refill:  3  . Insulin Glargine (LANTUS) 100 UNIT/ML Solostar Pen    Sig: Inject 30 units daily, if am sugar is < 100 reduce to 28 units    Dispense:  15 mL    Refill:  1  . lisinopril (PRINIVIL,ZESTRIL) 2.5 MG tablet    Sig: Take 1 tablet (2.5 mg total) by mouth daily.    Dispense:  90 tablet    Refill:  3  . metFORMIN (GLUCOPHAGE) 1000 MG tablet     Sig: Take 1 tablet (1,000 mg total) by mouth 2 (two) times daily with a meal.    Dispense:  60 tablet    Refill:  3  . nitroGLYCERIN (NITROSTAT) 0.4 MG SL tablet    Sig: Place 1 tablet (0.4 mg total) under the tongue every 5 (five) minutes x 3 doses as needed for chest pain.    Dispense:  90 tablet    Refill:  3  . pantoprazole (PROTONIX) 40 MG tablet    Sig: Take 1 tablet (40 mg total) by mouth daily.    Dispense:  30 tablet    Refill:  6  . pregabalin (LYRICA) 100 MG capsule    Sig: Take 1 capsule (100 mg total) by mouth 2 (two) times daily.    Dispense:  60 capsule    Refill:  3    Immunization History  Administered Date(s) Administered  . Influenza Split 09/23/2012    Family History  Problem Relation Age of Onset  . CAD Father     MI at age 44  . Diabetes Father   . Hypertension Father   . Cancer Father   . CAD Brother     History  Substance Use Topics  . Smoking status: Never Smoker   . Smokeless tobacco: Never Used  . Alcohol Use: No    Review of Systems   As noted in HPI  Filed Vitals:   08/06/13 1111  BP: 134/82  Pulse: 87  Temp: 98 F (36.7 C)  Resp: 16    Physical Exam  Physical Exam  Eyes: EOM are normal.  Right eye surgical pupil  Neck: Neck supple.  Cardiovascular: Normal rate and regular rhythm.   Pulmonary/Chest: Breath sounds normal. No respiratory distress. She has no wheezes. She has no rales.  Musculoskeletal: She exhibits no edema.    CBC    Component Value Date/Time   WBC 9.0 05/07/2013 1133   RBC 5.30* 05/07/2013 1133   HGB 12.1 05/07/2013 1133   HCT 36.8 05/07/2013 1133   PLT 353 05/07/2013 1133   MCV 69.4* 05/07/2013 1133   LYMPHSABS  2.3 05/07/2013 1133   MONOABS 0.5 05/07/2013 1133   EOSABS 0.1 05/07/2013 1133   BASOSABS 0.0 05/07/2013 1133    CMP     Component Value Date/Time   NA 138 07/19/2013 0845   K 4.8 07/19/2013 0845   CL 104 07/19/2013 0845   CO2 26 07/19/2013 0845   GLUCOSE 129* 07/19/2013 0845   BUN 16  07/19/2013 0845   CREATININE 1.1 07/19/2013 0845   CREATININE 0.94 05/07/2013 1133   CALCIUM 8.9 07/19/2013 0845   PROT 7.5 05/07/2013 1133   ALBUMIN 4.0 05/07/2013 1133   AST 17 05/07/2013 1133   ALT 18 05/07/2013 1133   ALKPHOS 71 05/07/2013 1133   BILITOT 0.4 05/07/2013 1133   GFRNONAA 66 05/07/2013 1133   GFRNONAA 79* 04/14/2012 1051   GFRAA 76 05/07/2013 1133   GFRAA >90 04/14/2012 1051    Lab Results  Component Value Date/Time   CHOL 113 05/07/2013 11:33 AM    No components found with this basename: hga1c    Lab Results  Component Value Date/Time   AST 17 05/07/2013 11:33 AM    Assessment and Plan  Diabetes mellitus due to underlying condition without complications - Plan: Results for orders placed in visit on 08/06/13  GLUCOSE, POCT (MANUAL RESULT ENTRY)      Result Value Ref Range   POC Glucose 263 (*) 70 - 99 mg/dl  POCT GLYCOSYLATED HEMOGLOBIN (HGB A1C)      Result Value Ref Range   Hemoglobin A1C 8.0     Diabetes is improving she'll continue with current medications and follow the recommendations as per her endocrinologist  Glucose (CBG), HgB A1c, insulin aspart (NOVOLOG) 100 UNIT/ML FlexPen, Insulin Glargine (LANTUS) 100 UNIT/ML Solostar Pen, metFORMIN (GLUCOPHAGE) 1000 MG tablet  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  Blurry vision - Plan: Ambulatory referral to Ophthalmology    Return in about 3 months (around 11/06/2013) for hypertension, CAD, diabetes.  Lorayne Marek, MD

## 2013-08-06 NOTE — Patient Instructions (Signed)
Diabetes Mellitus and Food It is important for you to manage your blood sugar (glucose) level. Your blood glucose level can be greatly affected by what you eat. Eating healthier foods in the appropriate amounts throughout the day at about the same time each day will help you control your blood glucose level. It can also help slow or prevent worsening of your diabetes mellitus. Healthy eating may even help you improve the level of your blood pressure and reach or maintain a healthy weight.  HOW CAN FOOD AFFECT ME? Carbohydrates Carbohydrates affect your blood glucose level more than any other type of food. Your dietitian will help you determine how many carbohydrates to eat at each meal and teach you how to count carbohydrates. Counting carbohydrates is important to keep your blood glucose at a healthy level, especially if you are using insulin or taking certain medicines for diabetes mellitus. Alcohol Alcohol can cause sudden decreases in blood glucose (hypoglycemia), especially if you use insulin or take certain medicines for diabetes mellitus. Hypoglycemia can be a life-threatening condition. Symptoms of hypoglycemia (sleepiness, dizziness, and disorientation) are similar to symptoms of having too much alcohol.  If your health care provider has given you approval to drink alcohol, do so in moderation and use the following guidelines:  Women should not have more than one drink per day, and men should not have more than two drinks per day. One drink is equal to:  12 oz of beer.  5 oz of wine.  1 oz of hard liquor.  Do not drink on an empty stomach.  Keep yourself hydrated. Have water, diet soda, or unsweetened iced tea.  Regular soda, juice, and other mixers might contain a lot of carbohydrates and should be counted. WHAT FOODS ARE NOT RECOMMENDED? As you make food choices, it is important to remember that all foods are not the same. Some foods have fewer nutrients per serving than other  foods, even though they might have the same number of calories or carbohydrates. It is difficult to get your body what it needs when you eat foods with fewer nutrients. Examples of foods that you should avoid that are high in calories and carbohydrates but low in nutrients include:  Trans fats (most processed foods list trans fats on the Nutrition Facts label).  Regular soda.  Juice.  Candy.  Sweets, such as cake, pie, doughnuts, and cookies.  Fried foods. WHAT FOODS CAN I EAT? Have nutrient-rich foods, which will nourish your body and keep you healthy. The food you should eat also will depend on several factors, including:  The calories you need.  The medicines you take.  Your weight.  Your blood glucose level.  Your blood pressure level.  Your cholesterol level. You also should eat a variety of foods, including:  Protein, such as meat, poultry, fish, tofu, nuts, and seeds (lean animal proteins are best).  Fruits.  Vegetables.  Dairy products, such as milk, cheese, and yogurt (low fat is best).  Breads, grains, pasta, cereal, rice, and beans.  Fats such as olive oil, trans fat-free margarine, canola oil, avocado, and olives. DOES EVERYONE WITH DIABETES MELLITUS HAVE THE SAME MEAL PLAN? Because every person with diabetes mellitus is different, there is not one meal plan that works for everyone. It is very important that you meet with a dietitian who will help you create a meal plan that is just right for you. Document Released: 10/04/2004 Document Revised: 01/12/2013 Document Reviewed: 12/04/2012 [employer] Patient Information 2015 [employer]. This   information is not intended to replace advice given to you by your health care provider. Make sure you discuss any questions you have with your health care provider.  

## 2013-08-06 NOTE — Progress Notes (Signed)
Patient here with interpreter Here for follow up on her DM and Medication refills

## 2013-08-09 ENCOUNTER — Telehealth: Payer: Self-pay | Admitting: *Deleted

## 2013-08-09 ENCOUNTER — Other Ambulatory Visit: Payer: Self-pay | Admitting: *Deleted

## 2013-08-09 DIAGNOSIS — E785 Hyperlipidemia, unspecified: Secondary | ICD-10-CM

## 2013-08-09 MED ORDER — LIRAGLUTIDE 18 MG/3ML ~~LOC~~ SOPN: 1.2000 mg | PEN_INJECTOR | Freq: Every day | SUBCUTANEOUS | Status: DC

## 2013-08-09 NOTE — Telephone Encounter (Signed)
Received a fax from [place] ([facility] program) stating the patient has been taking Crestor 20 mg, as Lipitor is not available for free. The pharmacy would like a new prescription faxed over for Crestor 20 mg. Vivia Birmingham, RN

## 2013-08-09 NOTE — Telephone Encounter (Signed)
Send the prescription for Crestor 20 mg

## 2013-08-10 MED ORDER — ROSUVASTATIN CALCIUM 20 MG PO TABS: 20.0000 mg | ORAL_TABLET | Freq: Every day | ORAL | Status: DC

## 2013-08-30 ENCOUNTER — Telehealth: Payer: Self-pay | Admitting: *Deleted

## 2013-08-30 NOTE — Telephone Encounter (Signed)
Received a fax from [facility] stating that Protonix is no longer available for free. Patient had a Nexium prescription written  On 10/23/2012 by Dr. Irwin Brakeman for a 90 day supply. Pharmacy would like a another prescription for Nexium. Is this ok? Patient also requesting a refill on Proventil HFA  (quantity 3 Inhalers) Inhale 1 or 2 puffs into the lungs every 6 hours as needed for wheezing. I did not see this listed on medication list. Vivia Birmingham, RN

## 2013-09-21 ENCOUNTER — Other Ambulatory Visit: Payer: No Typology Code available for payment source

## 2013-09-22 ENCOUNTER — Other Ambulatory Visit: Payer: Self-pay | Admitting: *Deleted

## 2013-09-22 ENCOUNTER — Other Ambulatory Visit (INDEPENDENT_AMBULATORY_CARE_PROVIDER_SITE_OTHER): Payer: No Typology Code available for payment source

## 2013-09-22 DIAGNOSIS — E1149 Type 2 diabetes mellitus with other diabetic neurological complication: Secondary | ICD-10-CM

## 2013-09-22 LAB — BASIC METABOLIC PANEL
BUN: 13 mg/dL (ref 6–23)
CHLORIDE: 105 meq/L (ref 96–112)
CO2: 26 meq/L (ref 19–32)
Calcium: 8.8 mg/dL (ref 8.4–10.5)
Creatinine, Ser: 1 mg/dL (ref 0.4–1.2)
GFR: 63.69 mL/min (ref >60.00)
Glucose, Bld: 92 mg/dL (ref 70–99)
Potassium: 4.7 mEq/L (ref 3.5–5.1)
SODIUM: 138 meq/L (ref 135–145)

## 2013-09-22 LAB — LIPID PANEL
CHOLESTEROL: 85 mg/dL (ref 0–200)
HDL: 30.6 mg/dL — ABNORMAL LOW (ref >39.00)
LDL Cholesterol: 23 mg/dL (ref 0–99)
NonHDL: 54.4
Total CHOL/HDL Ratio: 3
Triglycerides: 157 mg/dL — ABNORMAL HIGH (ref 0.0–149.0)
VLDL: 31.4 mg/dL (ref 0.0–40.0)

## 2013-09-23 ENCOUNTER — Ambulatory Visit: Payer: No Typology Code available for payment source | Admitting: Endocrinology

## 2013-09-23 LAB — FRUCTOSAMINE: FRUCTOSAMINE: 272 umol/L (ref 0–285)

## 2013-09-24 ENCOUNTER — Ambulatory Visit: Payer: No Typology Code available for payment source | Admitting: Endocrinology

## 2013-10-01 ENCOUNTER — Telehealth: Payer: Self-pay | Admitting: Internal Medicine

## 2013-10-01 ENCOUNTER — Telehealth: Payer: Self-pay | Admitting: Emergency Medicine

## 2013-10-01 ENCOUNTER — Encounter: Payer: Self-pay | Admitting: *Deleted

## 2013-10-01 NOTE — Telephone Encounter (Signed)
[name] came in to the facility asking for a refill for gabapentin (NEURONTIN) capsule 300 mg. [name] states that she has numbness in hands. Please f/u with pt.

## 2013-10-04 ENCOUNTER — Encounter: Payer: Self-pay | Admitting: *Deleted

## 2013-10-06 ENCOUNTER — Telehealth: Payer: Self-pay | Admitting: Emergency Medicine

## 2013-10-06 NOTE — Telephone Encounter (Signed)
Pt is not taking Lyrica for neuropathy. Medication clarified through [facility] pharmacy. Medication Gabapentin reordered per verbal order

## 2013-10-13 ENCOUNTER — Encounter: Payer: Self-pay | Admitting: Endocrinology

## 2013-10-13 ENCOUNTER — Other Ambulatory Visit: Payer: Self-pay

## 2013-10-13 ENCOUNTER — Ambulatory Visit (INDEPENDENT_AMBULATORY_CARE_PROVIDER_SITE_OTHER): Payer: No Typology Code available for payment source | Admitting: Endocrinology

## 2013-10-13 VITALS — BP 110/78 | HR 75 | Temp 97.4°F | Resp 12 | Wt 185.0 lb

## 2013-10-13 DIAGNOSIS — E089 Diabetes mellitus due to underlying condition without complications: Secondary | ICD-10-CM

## 2013-10-13 DIAGNOSIS — E1165 Type 2 diabetes mellitus with hyperglycemia: Principal | ICD-10-CM

## 2013-10-13 DIAGNOSIS — IMO0001 Reserved for inherently not codable concepts without codable children: Secondary | ICD-10-CM

## 2013-10-13 MED ORDER — LIRAGLUTIDE 18 MG/3ML ~~LOC~~ SOPN: 1.2000 mg | PEN_INJECTOR | Freq: Every day | SUBCUTANEOUS | Status: DC

## 2013-10-13 MED ORDER — INSULIN GLARGINE 100 UNIT/ML SOLOSTAR PEN: PEN_INJECTOR | SUBCUTANEOUS | Status: DC

## 2013-10-13 MED ORDER — INSULIN ASPART 100 UNIT/ML FLEXPEN: PEN_INJECTOR | SUBCUTANEOUS | Status: DC

## 2013-10-13 MED ORDER — METFORMIN HCL 1000 MG PO TABS: 1000.0000 mg | ORAL_TABLET | Freq: Two times a day (BID) | ORAL | Status: DC

## 2013-10-13 NOTE — Progress Notes (Signed)
Patient ID: Kathleen Holt, female   DOB: [birth year], 58 y.o.   MRN: [id number]    Reason for Appointment: F/u for Type 2 Diabetes  History of Present Illness:          Diagnosis: Type 2 diabetes mellitus, date of diagnosis: 2005        Past history: The diabetes was diagnosed about 10 years ago and she was probably treated with metformin and other oral agents for at least 6 years. Previously she had tried metformin, Januvia and Amaryl When she was admitted for coronary disease in 2011 in [place] she was switched to insulin and oral agents. Not clear how her control has been in the past She has been on Lantus and NovoLog insulin since 2011 She also had tried Byetta with her insulin for sometime but not clear if it was helping her   Recent history:  INSULIN regimen is described as:  Lantus 30 at bedtime, NovoLog 12-16 units ac tid  Because of her poor control and persistently high A1c of 10.1% in 04/2013 her metformin was increased to maximum dose. Also she was started on Victoza but she was unable to tolerate the 1.2 mg dosage since she had decreased appetite and abdominal distress Tolerating Victoza fairly well with 0.6 mg but not clear if she has benefited much especially with weight loss Also since she tends to have high postprandial readings her NovoLog was increased  Her A1c did improve in 7/15 but her fructosamine seems higher now than before  Not clear if she is using her Accu-Chek monitor and not keeping a record, did not bring her monitor today She says she cannot walk because of leg and back discomfort and numbness in her feet She has not reported any hypoglycemia recently, her breakfast dose was reduced on her last visit Has been being able to lose weight       Oral hypoglycemic drugs the patient is taking are: Metformin 2 g daily      Side effects from medications have been:  Glucose monitoring:  done once or twice a day        Glucometer: Accucheck Blood Glucose readings  from monitored review: Fasting readings recently 118, 115 Pc 130-140  Glycemic control:  Fructosamine 272, was 238  Lab Results  Component Value Date   HGBA1C 8.0 08/06/2013   HGBA1C 10.1 05/07/2013   HGBA1C 10.5* 04/14/2012   Lab Results  Component Value Date   MICROALBUR 1.1 07/19/2013   LDLCALC 23 09/22/2013   CREATININE 1.0 09/22/2013    Self-care: The diet that the patient has been following is: None, usually vegetarian  Meals: 3 meals per day.          Exercise: none          Dietician visit: Most recent:?.               Compliance with the medical regimen: Fair Retinal exam: Most recent: optometrist   Weight history: Wt Readings from Last 3 Encounters:  10/13/13 185 lb (83.915 kg)  07/22/13 184 lb 9.6 oz (83.734 kg)  06/08/13 179 lb 12.8 oz (81.557 kg)     No visits with results within 1 Week(s) from this visit. Latest known visit with results is:  Appointment on 09/22/2013  Component Date Value Ref Range Status  . Cholesterol 09/22/2013 85  0 - 200 mg/dL Final   ATP III Classification       Desirable:  < 200 mg/dL  Borderline High:  200 - 239 mg/dL          High:  > = 240 mg/dL  . Triglycerides 09/22/2013 157.0* 0.0 - 149.0 mg/dL Final   Normal:  <150 mg/dLBorderline High:  150 - 199 mg/dL  . HDL 09/22/2013 30.60* >39.00 mg/dL Final  . VLDL 09/22/2013 31.4  0.0 - 40.0 mg/dL Final  . LDL Cholesterol 09/22/2013 23  0 - 99 mg/dL Final  . Total CHOL/HDL Ratio 09/22/2013 3   Final                  Men          Women1/2 Average Risk     3.4          3.3Average Risk          5.0          4.42X Average Risk          9.6          7.13X Average Risk          15.0          11.0                      . NonHDL 09/22/2013 54.40   Final   NOTE:  Non-HDL goal should be 30 mg/dL higher than patient's LDL goal (i.e. LDL goal of < 70 mg/dL, would have non-HDL goal of < 100 mg/dL)  . Fructosamine 09/22/2013 272  0 - 285 umol/L Final   Comment: Published reference interval  for apparently healthy subjects                          between age 54 and 16 is 73 - 285 umol/L and in a poorly                          controlled diabetic population is 228 - 563 umol/L with a                          mean of 396 umol/L.  Marland Kitchen Sodium 09/22/2013 138  135 - 145 mEq/L Final  . Potassium 09/22/2013 4.7  3.5 - 5.1 mEq/L Final  . Chloride 09/22/2013 105  96 - 112 mEq/L Final  . CO2 09/22/2013 26  19 - 32 mEq/L Final  . Glucose, Bld 09/22/2013 92  70 - 99 mg/dL Final  . BUN 09/22/2013 13  6 - 23 mg/dL Final  . Creatinine, Ser 09/22/2013 1.0  0.4 - 1.2 mg/dL Final  . Calcium 09/22/2013 8.8  8.4 - 10.5 mg/dL Final  . GFR 09/22/2013 63.69  >60.00 mL/min Final      Medication List       This list is accurate as of: 10/13/13  9:13 AM.  Always use your most recent med list.               aspirin 81 MG tablet  Take 4 tablets (325 mg total) by mouth daily.     carvedilol 3.125 MG tablet  Commonly known as:  COREG  Take 1 tablet (3.125 mg total) by mouth 2 (two) times daily with a meal.     clopidogrel 75 MG tablet  Commonly known as:  PLAVIX  Take 1 tablet (75 mg total) by mouth daily.     insulin aspart 100 UNIT/ML FlexPen  Commonly known as:  NOVOLOG  Inject 12-16 units three times a day depending on meal     Insulin Glargine 100 UNIT/ML Solostar Pen  Commonly known as:  LANTUS  Inject 30 units daily, if am sugar is < 100 reduce to 28 units     Insulin Pen Needle 31G X 5 MM Misc  1 Units by Does not apply route as directed.     Liraglutide 18 MG/3ML Sopn  Inject 1.2 mg into the skin daily.     lisinopril 2.5 MG tablet  Commonly known as:  PRINIVIL,ZESTRIL  Take 1 tablet (2.5 mg total) by mouth daily.     metFORMIN 1000 MG tablet  Commonly known as:  GLUCOPHAGE  Take 1 tablet (1,000 mg total) by mouth 2 (two) times daily with a meal.     nitroGLYCERIN 0.4 MG SL tablet  Commonly known as:  NITROSTAT  Place 1 tablet (0.4 mg total) under the tongue every 5  (five) minutes x 3 doses as needed for chest pain.     pantoprazole 40 MG tablet  Commonly known as:  PROTONIX  Take 1 tablet (40 mg total) by mouth daily.     Polyethyl Glycol-Propyl Glycol 0.4-0.3 % Soln  1 drop in each eye every 3 hours as needed.     pregabalin 100 MG capsule  Commonly known as:  LYRICA  Take 1 capsule (100 mg total) by mouth 2 (two) times daily.     rosuvastatin 20 MG tablet  Commonly known as:  CRESTOR  Take 1 tablet (20 mg total) by mouth daily.        Allergies: No Known Allergies  Past Medical History  Diagnosis Date  . Diabetes mellitus without complication   . Coronary artery disease     a. s/p stenting 11/2009 [facility]  . Hyperlipidemia   . Diabetic neuropathy   . Ejection fraction      EF 45-50%,  echo, February 10, 2012, akinesis posterior lateral wall, diastolic dysfunction, mild mitral regurgitation,  . Mitral regurgitation     Mild, echo, January, 2014  . Stroke     Past Surgical History  Procedure Laterality Date  . Eye surgery    . Abdominal hysterectomy      Family History  Problem Relation Age of Onset  . CAD Father     MI at age 86  . Diabetes Father   . Hypertension Father   . Cancer Father   . CAD Brother     Social History:  reports that she has never smoked. She has never used smokeless tobacco. She reports that she does not drink alcohol or use illicit drugs.    Review of Systems       Lipids: On Crestor since her hospitalization for coronary artery disease in 2011 with good results      Lab Results  Component Value Date   CHOL 85 09/22/2013   HDL 30.60* 09/22/2013   LDLCALC 23 09/22/2013   TRIG 157.0* 09/22/2013   CHOLHDL 3 09/22/2013        She has a long-standing history of Numbness, tingling in her feet treated with gabapentin   No recent swelling of her feet   Complications: Neuropathy, no evidence of nephropathy   Physical Examination:  BP 110/78  Pulse 75  Temp(Src)  97.4 F (36.3 C) (Oral)  Resp 12  Wt 185 lb (83.915 kg)  SpO2 97%       ASSESSMENT:  Diabetes  type 2, uncontrolled with obesity  Although she had benefited from modification of her treatment regimen earlier this year she still has a relatively high fructosamine A1c not due as yet She thinks she is checking her blood sugars regularly but cannot confirm what her blood sugars are since she did not bring her monitor or any written record However has not been able to lose weight Tolerating Victoza 0.6 and probably benefiting from this dose also   PLAN:  She will try 0.9 mg Victoza More postprandial readings and bring monitor on each visit Start regular exercise program at least with chair exercises  Check A1c on the next visit     Laylani Pudwill 10/13/2013, 9:13 AM

## 2013-10-13 NOTE — Patient Instructions (Signed)
Walk or chair exercise daily  Please check blood sugars at least half the time about 2 hours after any meal and 2-3 times per week on waking up. Please bring blood sugar monitor to each visit  Try increasing Victoza to 5 clicks after 0.6 dose  Crestor 1/2 daily

## 2013-10-25 ENCOUNTER — Other Ambulatory Visit: Payer: Self-pay

## 2013-11-11 ENCOUNTER — Encounter: Payer: Self-pay | Admitting: Internal Medicine

## 2013-11-11 ENCOUNTER — Ambulatory Visit: Payer: Medicaid Other | Attending: Internal Medicine | Admitting: Internal Medicine

## 2013-11-11 VITALS — BP 131/78 | HR 83 | Temp 98.0°F | Resp 16

## 2013-11-11 DIAGNOSIS — H538 Other visual disturbances: Secondary | ICD-10-CM

## 2013-11-11 DIAGNOSIS — I340 Nonrheumatic mitral (valve) insufficiency: Secondary | ICD-10-CM | POA: Diagnosis not present

## 2013-11-11 DIAGNOSIS — E089 Diabetes mellitus due to underlying condition without complications: Secondary | ICD-10-CM

## 2013-11-11 DIAGNOSIS — Z23 Encounter for immunization: Secondary | ICD-10-CM

## 2013-11-11 DIAGNOSIS — I10 Essential (primary) hypertension: Secondary | ICD-10-CM | POA: Diagnosis not present

## 2013-11-11 DIAGNOSIS — K219 Gastro-esophageal reflux disease without esophagitis: Secondary | ICD-10-CM

## 2013-11-11 DIAGNOSIS — Z7982 Long term (current) use of aspirin: Secondary | ICD-10-CM | POA: Insufficient documentation

## 2013-11-11 DIAGNOSIS — E785 Hyperlipidemia, unspecified: Secondary | ICD-10-CM | POA: Diagnosis not present

## 2013-11-11 DIAGNOSIS — Z794 Long term (current) use of insulin: Secondary | ICD-10-CM | POA: Diagnosis not present

## 2013-11-11 DIAGNOSIS — Z951 Presence of aortocoronary bypass graft: Secondary | ICD-10-CM | POA: Insufficient documentation

## 2013-11-11 DIAGNOSIS — Z833 Family history of diabetes mellitus: Secondary | ICD-10-CM | POA: Insufficient documentation

## 2013-11-11 DIAGNOSIS — E139 Other specified diabetes mellitus without complications: Secondary | ICD-10-CM | POA: Diagnosis present

## 2013-11-11 DIAGNOSIS — I2510 Atherosclerotic heart disease of native coronary artery without angina pectoris: Secondary | ICD-10-CM | POA: Insufficient documentation

## 2013-11-11 DIAGNOSIS — I25119 Atherosclerotic heart disease of native coronary artery with unspecified angina pectoris: Secondary | ICD-10-CM

## 2013-11-11 DIAGNOSIS — E559 Vitamin D deficiency, unspecified: Secondary | ICD-10-CM

## 2013-11-11 DIAGNOSIS — E1340 Other specified diabetes mellitus with diabetic neuropathy, unspecified: Secondary | ICD-10-CM | POA: Insufficient documentation

## 2013-11-11 LAB — GLUCOSE, POCT (MANUAL RESULT ENTRY): POC GLUCOSE: 269 mg/dL — AB (ref 70–99)

## 2013-11-11 LAB — POCT GLYCOSYLATED HEMOGLOBIN (HGB A1C): HEMOGLOBIN A1C: 7.7

## 2013-11-11 MED ORDER — INSULIN ASPART 100 UNIT/ML ~~LOC~~ SOLN
10.0000 [IU] | Freq: Once | SUBCUTANEOUS | Status: AC
  Administered 2013-11-11: 10 [IU] via SUBCUTANEOUS

## 2013-11-11 MED ORDER — CARVEDILOL 3.125 MG PO TABS: 3.1250 mg | ORAL_TABLET | Freq: Two times a day (BID) | ORAL | Status: DC

## 2013-11-11 MED ORDER — ROSUVASTATIN CALCIUM 20 MG PO TABS: 20.0000 mg | ORAL_TABLET | Freq: Every day | ORAL | Status: DC

## 2013-11-11 MED ORDER — VITAMIN D (ERGOCALCIFEROL) 1.25 MG (50000 UNIT) PO CAPS: 50000.0000 [IU] | ORAL_CAPSULE | ORAL | Status: DC

## 2013-11-11 MED ORDER — METFORMIN HCL 1000 MG PO TABS: 1000.0000 mg | ORAL_TABLET | Freq: Two times a day (BID) | ORAL | Status: DC

## 2013-11-11 NOTE — Progress Notes (Signed)
Patient here for follow up on her diabetes and hypertension Today patient blood sugar is elevated at 269

## 2013-11-11 NOTE — Progress Notes (Signed)
MRN: [id number] Name: Kathleen Holt  Sex: female Age: 58 y.o. DOB: 03/02/1955  Allergies: Review of patient's allergies indicates no known allergies.  Chief Complaint  Patient presents with  . Follow-up    HPI: Patient is 58 y.o. female who history of hypertension diabetes, CAD comes today for followup, she denies any acute symptoms, she also follows up with her endocrinologist and today her hemoglobin A1c is 7.7% which is improved from before, again reinforced diet modification, she is already on metformin, Lantus, NovoLog, victoza, she does complain of blurry vision but has not been able to schedule appointment with ophthalmologist.  Past Medical History  Diagnosis Date  . Diabetes mellitus without complication   . Coronary artery disease     a. s/p stenting 11/2009 [facility]  . Hyperlipidemia   . Diabetic neuropathy   . Ejection fraction      EF 45-50%,  echo, February 10, 2012, akinesis posterior lateral wall, diastolic dysfunction, mild mitral regurgitation,  . Mitral regurgitation     Mild, echo, January, 2014  . Stroke     Past Surgical History  Procedure Laterality Date  . Eye surgery    . Abdominal hysterectomy        Medication List       This list is accurate as of: 11/11/13 11:15 AM.  Always use your most recent med list.               aspirin 81 MG tablet  Take 4 tablets (325 mg total) by mouth daily.     carvedilol 3.125 MG tablet  Commonly known as:  COREG  Take 1 tablet (3.125 mg total) by mouth 2 (two) times daily with a meal.     clopidogrel 75 MG tablet  Commonly known as:  PLAVIX  Take 1 tablet (75 mg total) by mouth daily.     insulin aspart 100 UNIT/ML FlexPen  Commonly known as:  NOVOLOG  Inject 12-16 units three times a day depending on meal     Insulin Glargine 100 UNIT/ML Solostar Pen  Commonly known as:  LANTUS  Inject 30 units daily, if am sugar is < 100 reduce to 28 units     Insulin Pen  Needle 31G X 5 MM Misc  1 Units by Does not apply route as directed.     Liraglutide 18 MG/3ML Sopn  Inject 1.2 mg into the skin daily.     lisinopril 2.5 MG tablet  Commonly known as:  PRINIVIL,ZESTRIL  Take 1 tablet (2.5 mg total) by mouth daily.     metFORMIN 1000 MG tablet  Commonly known as:  GLUCOPHAGE  Take 1 tablet (1,000 mg total) by mouth 2 (two) times daily with a meal.     nitroGLYCERIN 0.4 MG SL tablet  Commonly known as:  NITROSTAT  Place 1 tablet (0.4 mg total) under the tongue every 5 (five) minutes x 3 doses as needed for chest pain.     pantoprazole 40 MG tablet  Commonly known as:  PROTONIX  Take 1 tablet (40 mg total) by mouth daily.     Polyethyl Glycol-Propyl Glycol 0.4-0.3 % Soln  1 drop in each eye every 3 hours as needed.     pregabalin 100 MG capsule  Commonly known as:  LYRICA  Take 1 capsule (100 mg total) by mouth 2 (two) times daily.     rosuvastatin 20 MG tablet  Commonly known as:  CRESTOR  Take 1 tablet (20  mg total) by mouth daily.        Meds ordered this encounter  Medications  . insulin aspart (novoLOG) injection 10 Units    Sig:     Immunization History  Administered Date(s) Administered  . Influenza Split 09/23/2012  . Influenza,inj,Quad PF,36+ Mos 11/11/2013    Family History  Problem Relation Age of Onset  . CAD Father     MI at age 7  . Diabetes Father   . Hypertension Father   . Cancer Father   . CAD Brother     History  Substance Use Topics  . Smoking status: Never Smoker   . Smokeless tobacco: Never Used  . Alcohol Use: No    Review of Systems   As noted in HPI  Filed Vitals:   11/11/13 1057  BP: 131/78  Pulse: 83  Temp: 98 F (36.7 C)  Resp: 16    Physical Exam  Physical Exam  Constitutional: No distress.  Eyes: EOM are normal. Pupils are equal, round, and reactive to light.  Cardiovascular: Normal rate and regular rhythm.   Pulmonary/Chest: Breath sounds normal. No respiratory  distress. She has no wheezes. She has no rales.  Musculoskeletal: She exhibits no edema.    CBC    Component Value Date/Time   WBC 9.0 05/07/2013 1133   RBC 5.30* 05/07/2013 1133   HGB 12.1 05/07/2013 1133   HCT 36.8 05/07/2013 1133   PLT 353 05/07/2013 1133   MCV 69.4* 05/07/2013 1133   LYMPHSABS 2.3 05/07/2013 1133   MONOABS 0.5 05/07/2013 1133   EOSABS 0.1 05/07/2013 1133   BASOSABS 0.0 05/07/2013 1133    CMP     Component Value Date/Time   NA 138 09/22/2013 0923   K 4.7 09/22/2013 0923   CL 105 09/22/2013 0923   CO2 26 09/22/2013 0923   GLUCOSE 92 09/22/2013 0923   BUN 13 09/22/2013 0923   CREATININE 1.0 09/22/2013 0923   CREATININE 0.94 05/07/2013 1133   CALCIUM 8.8 09/22/2013 0923   PROT 7.5 05/07/2013 1133   ALBUMIN 4.0 05/07/2013 1133   AST 17 05/07/2013 1133   ALT 18 05/07/2013 1133   ALKPHOS 71 05/07/2013 1133   BILITOT 0.4 05/07/2013 1133   GFRNONAA 66 05/07/2013 1133   GFRNONAA 79* 04/14/2012 1051   GFRAA 76 05/07/2013 1133   GFRAA >90 04/14/2012 1051    Lab Results  Component Value Date/Time   CHOL 85 09/22/2013  9:23 AM    No components found with this basename: hga1c    Lab Results  Component Value Date/Time   AST 17 05/07/2013 11:33 AM    Assessment and Plan  Other specified diabetes mellitus without complications - Plan:  Results for orders placed in visit on 11/11/13  GLUCOSE, POCT (MANUAL RESULT ENTRY)      Result Value Ref Range   POC Glucose 269 (*) 70 - 99 mg/dl  POCT GLYCOSYLATED HEMOGLOBIN (HGB A1C)      Result Value Ref Range   Hemoglobin A1C 7.7     hemoglobin A1c is trending down, I have advised patient for more diet control continue with current meds she'll follow with her endocrinologist.  Blurry vision - Plan: Ambulatory referral to Ophthalmology  Essential hypertension, benign Blood pressure is well-controlled continued current medications.  Needs flu shot   Health Maintenance Flu shot given today.   Return in about 3 months (around 02/11/2014) for  diabetes, hypertension, hyperipidemia.  Lorayne Marek, MD  She

## 2013-11-11 NOTE — Patient Instructions (Signed)
Diabetes Mellitus and Food It is important for you to manage your blood sugar (glucose) level. Your blood glucose level can be greatly affected by what you eat. Eating healthier foods in the appropriate amounts throughout the day at about the same time each day will help you control your blood glucose level. It can also help slow or prevent worsening of your diabetes mellitus. Healthy eating may even help you improve the level of your blood pressure and reach or maintain a healthy weight.  HOW CAN FOOD AFFECT ME? Carbohydrates Carbohydrates affect your blood glucose level more than any other type of food. Your dietitian will help you determine how many carbohydrates to eat at each meal and teach you how to count carbohydrates. Counting carbohydrates is important to keep your blood glucose at a healthy level, especially if you are using insulin or taking certain medicines for diabetes mellitus. Alcohol Alcohol can cause sudden decreases in blood glucose (hypoglycemia), especially if you use insulin or take certain medicines for diabetes mellitus. Hypoglycemia can be a life-threatening condition. Symptoms of hypoglycemia (sleepiness, dizziness, and disorientation) are similar to symptoms of having too much alcohol.  If your health care provider has given you approval to drink alcohol, do so in moderation and use the following guidelines:  Women should not have more than one drink per day, and men should not have more than two drinks per day. One drink is equal to:  12 oz of beer.  5 oz of wine.  1 oz of hard liquor.  Do not drink on an empty stomach.  Keep yourself hydrated. Have water, diet soda, or unsweetened iced tea.  Regular soda, juice, and other mixers might contain a lot of carbohydrates and should be counted. WHAT FOODS ARE NOT RECOMMENDED? As you make food choices, it is important to remember that all foods are not the same. Some foods have fewer nutrients per serving than other  foods, even though they might have the same number of calories or carbohydrates. It is difficult to get your body what it needs when you eat foods with fewer nutrients. Examples of foods that you should avoid that are high in calories and carbohydrates but low in nutrients include:  Trans fats (most processed foods list trans fats on the Nutrition Facts label).  Regular soda.  Juice.  Candy.  Sweets, such as cake, pie, doughnuts, and cookies.  Fried foods. WHAT FOODS CAN I EAT? Have nutrient-rich foods, which will nourish your body and keep you healthy. The food you should eat also will depend on several factors, including:  The calories you need.  The medicines you take.  Your weight.  Your blood glucose level.  Your blood pressure level.  Your cholesterol level. You also should eat a variety of foods, including:  Protein, such as meat, poultry, fish, tofu, nuts, and seeds (lean animal proteins are best).  Fruits.  Vegetables.  Dairy products, such as milk, cheese, and yogurt (low fat is best).  Breads, grains, pasta, cereal, rice, and beans.  Fats such as olive oil, trans fat-free margarine, canola oil, avocado, and olives. DOES EVERYONE WITH DIABETES MELLITUS HAVE THE SAME MEAL PLAN? Because every person with diabetes mellitus is different, there is not one meal plan that works for everyone. It is very important that you meet with a dietitian who will help you create a meal plan that is just right for you. Document Released: 10/04/2004 Document Revised: 01/12/2013 Document Reviewed: 12/04/2012 [employer] Patient Information 2015 [employer]. This   information is not intended to replace advice given to you by your health care provider. Make sure you discuss any questions you have with your health care provider.  

## 2013-12-08 ENCOUNTER — Other Ambulatory Visit: Payer: Self-pay | Admitting: Emergency Medicine

## 2013-12-08 ENCOUNTER — Telehealth: Payer: Self-pay | Admitting: Emergency Medicine

## 2013-12-08 ENCOUNTER — Telehealth: Payer: Self-pay | Admitting: Internal Medicine

## 2013-12-08 DIAGNOSIS — E785 Hyperlipidemia, unspecified: Secondary | ICD-10-CM

## 2013-12-08 MED ORDER — ROSUVASTATIN CALCIUM 20 MG PO TABS: 20.0000 mg | ORAL_TABLET | Freq: Every day | ORAL | Status: DC

## 2013-12-08 NOTE — Telephone Encounter (Signed)
Left Vm for pt to pick prescribed medication Crestor @ [facility]

## 2013-12-08 NOTE — Telephone Encounter (Signed)
[name] called to request a refill for rosuvastatin (CRESTOR) 20 MG tablet. Please f/u with husband, pt. Would like medication to be sent to [facility] in [place]. Please f/u at  [phone]

## 2013-12-09 ENCOUNTER — Telehealth: Payer: Self-pay | Admitting: Internal Medicine

## 2013-12-09 ENCOUNTER — Other Ambulatory Visit: Payer: Self-pay | Admitting: Emergency Medicine

## 2013-12-09 ENCOUNTER — Telehealth: Payer: Self-pay | Admitting: Emergency Medicine

## 2013-12-09 MED ORDER — ATORVASTATIN CALCIUM 20 MG PO TABS: 20.0000 mg | ORAL_TABLET | Freq: Every day | ORAL | Status: DC

## 2013-12-09 NOTE — Telephone Encounter (Signed)
[facility] is stating that Medicaid does not cover Crestor unless they are aware of a diagnosis. They recommended that you prescribe a different medication that will be covered. Please follow up with pt once this is addressed.

## 2013-12-09 NOTE — Telephone Encounter (Signed)
[facility] is stating that Medicaid does not cover Crestor unless they are aware of a diagnosis. They recommended that you prescribe a different medication that will be covered. Please follow up with pt once this is addressed.          Medication switched to Atorvastatin 20 mg tab

## 2013-12-27 ENCOUNTER — Other Ambulatory Visit: Payer: Self-pay | Admitting: *Deleted

## 2013-12-27 MED ORDER — INSULIN PEN NEEDLE 31G X 5 MM MISC: Status: DC

## 2014-01-12 ENCOUNTER — Other Ambulatory Visit: Payer: Self-pay | Admitting: *Deleted

## 2014-01-12 ENCOUNTER — Ambulatory Visit (INDEPENDENT_AMBULATORY_CARE_PROVIDER_SITE_OTHER): Payer: Medicaid Other | Admitting: Endocrinology

## 2014-01-12 VITALS — BP 112/75 | HR 82 | Temp 98.3°F | Resp 14 | Ht 60.0 in | Wt 188.4 lb

## 2014-01-12 DIAGNOSIS — E1140 Type 2 diabetes mellitus with diabetic neuropathy, unspecified: Secondary | ICD-10-CM

## 2014-01-12 DIAGNOSIS — I10 Essential (primary) hypertension: Secondary | ICD-10-CM

## 2014-01-12 DIAGNOSIS — E089 Diabetes mellitus due to underlying condition without complications: Secondary | ICD-10-CM

## 2014-01-12 DIAGNOSIS — E1149 Type 2 diabetes mellitus with other diabetic neurological complication: Secondary | ICD-10-CM

## 2014-01-12 MED ORDER — LIRAGLUTIDE 18 MG/3ML ~~LOC~~ SOPN: 1.2000 mg | PEN_INJECTOR | Freq: Every day | SUBCUTANEOUS | Status: DC

## 2014-01-12 MED ORDER — INSULIN GLARGINE 100 UNIT/ML SOLOSTAR PEN: PEN_INJECTOR | SUBCUTANEOUS | Status: DC

## 2014-01-12 MED ORDER — INSULIN ASPART 100 UNIT/ML FLEXPEN: PEN_INJECTOR | SUBCUTANEOUS | Status: DC

## 2014-01-12 MED ORDER — INSULIN PEN NEEDLE 31G X 5 MM MISC: Status: DC

## 2014-01-12 MED ORDER — GLUCOSE BLOOD VI STRP: ORAL_STRIP | Status: DC

## 2014-01-12 NOTE — Patient Instructions (Addendum)
Please check blood sugars at least half the time about 2 hours after any meal and times per week on waking up. Please bring blood sugar monitor to each visit  10 Novolog at breakfast

## 2014-01-12 NOTE — Progress Notes (Signed)
Patient ID: Kathleen Holt, female   DOB: 1955-10-19, 58 y.o.   MRN: [id number]    Reason for Appointment: F/u for Type 2 Diabetes  History of Present Illness:          Diagnosis: Type 2 diabetes mellitus, date of diagnosis: 2005        Past history: The diabetes was diagnosed about 10 years ago and she was probably treated with metformin and other oral agents for at least 6 years. Previously she had tried metformin, Januvia and Amaryl When she was admitted for coronary disease in 2011 in [place] she was switched to insulin and oral agents. Not clear how her control has been in the past She has been on Lantus and NovoLog insulin since 2011 She also had tried Byetta with her insulin for sometime but not clear if it was helping her   Recent history:  INSULIN regimen is described as:  Lantus 30 at bedtime, NovoLog 10-12 units ac tid  Because of her poor control and persistently high A1c of 10.1% in 04/2013 her metformin was increased to maximum dose. Also she was started on Victoza but she was unable to tolerate the 1.2 mg dosage since she had decreased appetite and abdominal distress Tolerating Victoza fairly well with 0.9 mg since her last visit Although her blood sugars may be improved she continues to gain a little weight Also she appears to be needing a little less NovoLog at mealtimes  Her A1c did improve in 10/15 and no recent labs available She says she cannot walk because of leg and back discomfort and numbness in her feet She has not reported any hypoglycemia at any time       Oral hypoglycemic drugs the patient is taking are: Metformin 2 g daily      Side effects from medications have been:  Glucose monitoring:  done once or twice a day        Glucometer: One Touch Ultra Blood Glucose readings from recall  PRE-MEAL Breakfast Lunch Dinner Bedtime Overall  Glucose range: 75-120      Mean/median:        POST-MEAL PC Breakfast PC Lunch PC Dinner  Glucose range: ?   89-140  141-160  Mean/median:       Glycemic control:  Fructosamine 272 in 9/15  Lab Results  Component Value Date   HGBA1C 7.7 11/11/2013   HGBA1C 8.0 08/06/2013   HGBA1C 10.1 05/07/2013   Lab Results  Component Value Date   MICROALBUR 1.1 07/19/2013   LDLCALC 23 09/22/2013   CREATININE 1.0 09/22/2013    Self-care: The diet that the patient has been following is: None, usually vegetarian      Exercise: none          Dietician visit:  none              Compliance with the medical regimen: Fair  Weight history:  Wt Readings from Last 3 Encounters:  01/12/14 188 lb 6.4 oz (85.458 kg)  10/13/13 185 lb (83.915 kg)  07/22/13 184 lb 9.6 oz (83.734 kg)     No visits with results within 1 Week(s) from this visit. Latest known visit with results is:  Office Visit on 11/11/2013  Component Date Value Ref Range Status  . POC Glucose 11/11/2013 269* 70 - 99 mg/dl Final  . Hemoglobin A1C 11/11/2013 7.7   Final      Medication List       This list is accurate as of:  01/12/14  9:56 AM.  Always use your most recent med list.               aspirin 81 MG tablet  Take 4 tablets (325 mg total) by mouth daily.     atorvastatin 20 MG tablet  Commonly known as:  LIPITOR  Take 1 tablet (20 mg total) by mouth daily.     carvedilol 3.125 MG tablet  Commonly known as:  COREG  Take 1 tablet (3.125 mg total) by mouth 2 (two) times daily with a meal.     clopidogrel 75 MG tablet  Commonly known as:  PLAVIX  Take 1 tablet (75 mg total) by mouth daily.     gabapentin 400 MG capsule  Commonly known as:  NEURONTIN  Take 400 mg by mouth 3 (three) times daily. Takes 2 tablets 3 times per day     insulin aspart 100 UNIT/ML FlexPen  Commonly known as:  NOVOLOG  Inject 12-16 units three times a day depending on meal     Insulin Glargine 100 UNIT/ML Solostar Pen  Commonly known as:  LANTUS  Inject 30 units daily, if am sugar is < 100 reduce to 28 units     Insulin Pen Needle 31G X 5  MM Misc  Use 4 per day to inject insulin     Liraglutide 18 MG/3ML Sopn  Inject 1.2 mg into the skin daily.     lisinopril 2.5 MG tablet  Commonly known as:  PRINIVIL,ZESTRIL  Take 1 tablet (2.5 mg total) by mouth daily.     metFORMIN 1000 MG tablet  Commonly known as:  GLUCOPHAGE  Take 1 tablet (1,000 mg total) by mouth 2 (two) times daily with a meal.     nitroGLYCERIN 0.4 MG SL tablet  Commonly known as:  NITROSTAT  Place 1 tablet (0.4 mg total) under the tongue every 5 (five) minutes x 3 doses as needed for chest pain.     pantoprazole 40 MG tablet  Commonly known as:  PROTONIX  Take 1 tablet (40 mg total) by mouth daily.     Polyethyl Glycol-Propyl Glycol 0.4-0.3 % Soln  1 drop in each eye every 3 hours as needed.     pregabalin 100 MG capsule  Commonly known as:  LYRICA  Take 1 capsule (100 mg total) by mouth 2 (two) times daily.     Vitamin D (Ergocalciferol) 50000 UNITS Caps capsule  Commonly known as:  DRISDOL  Take 1 capsule (50,000 Units total) by mouth every 7 (seven) days.        Allergies: No Known Allergies  Past Medical History  Diagnosis Date  . Diabetes mellitus without complication   . Coronary artery disease     a. s/p stenting 11/2009 [facility]  . Hyperlipidemia   . Diabetic neuropathy   . Ejection fraction      EF 45-50%,  echo, February 10, 2012, akinesis posterior lateral wall, diastolic dysfunction, mild mitral regurgitation,  . Mitral regurgitation     Mild, echo, January, 2014  . Stroke     Past Surgical History  Procedure Laterality Date  . Eye surgery    . Abdominal hysterectomy      Family History  Problem Relation Age of Onset  . CAD Father     MI at age 50  . Diabetes Father   . Hypertension Father   . Cancer Father   . CAD Brother     Social History:  reports that she has never smoked. She has never used smokeless tobacco. She reports that she does not drink alcohol or use illicit  drugs.    Review of Systems       Lipids: Has mixed dyslipidemia. On Crestor since her hospitalization for coronary artery disease in 2011       Lab Results  Component Value Date   CHOL 85 09/22/2013   HDL 30.60* 09/22/2013   LDLCALC 23 09/22/2013   TRIG 157.0* 09/22/2013   CHOLHDL 3 09/22/2013        She has a long-standing history of Numbness, tingling in her feet treated with gabapentin   Carpal tunnel syndrome: She periodically has numbness at night in both hands related by getting up and moving around.  She says her doctor in [place] had given her braces but she is not using these  No recent swelling of her feet   Physical Examination:  BP 112/75 mmHg  Pulse 82  Temp(Src) 98.3 F (36.8 C)  Resp 14  Ht 5' (1.524 m)  Wt 188 lb 6.4 oz (85.458 kg)  BMI 36.79 kg/m2  SpO2 98%      no ankle edema  ASSESSMENT:  Diabetes type 2, uncontrolled with obesity  Although she had benefited from adding Victoza to her basal bolus insulin regimen she appears to be gaining weight This is despite generally having small portions at meals Not able to exercise because of low back pain Tolerating Victoza 0.9 and probably benefiting from this dose since she is not needing as much mealtime insulin Although she is living her blood sugars are fairly good she did not bring her monitor for review  PLAN:  She will continue the regimen unchanged except reduced the NovoLog to 10 units in the morning  Check A1c on the next visit   Start using Accu-Chek which is covered by her insurance and bring monitor on each visit    Rajanae Mantia 01/12/2014, 9:56 AM

## 2014-01-17 ENCOUNTER — Other Ambulatory Visit: Payer: Self-pay | Admitting: *Deleted

## 2014-01-17 MED ORDER — ACCU-CHEK AVIVA PLUS W/DEVICE KIT: PACK | Status: DC

## 2014-01-17 MED ORDER — GLUCOSE BLOOD VI STRP
ORAL_STRIP | Status: DC
Start: 2014-01-17 — End: 2014-10-11

## 2014-01-17 MED ORDER — ACCU-CHEK SOFTCLIX LANCETS MISC
Status: DC
Start: 2014-01-17 — End: 2014-10-11

## 2014-01-28 ENCOUNTER — Encounter: Payer: Self-pay | Admitting: Internal Medicine

## 2014-01-28 ENCOUNTER — Ambulatory Visit: Payer: Medicaid Other | Attending: Internal Medicine | Admitting: Internal Medicine

## 2014-01-28 VITALS — BP 118/75 | HR 77 | Temp 98.0°F | Resp 16 | Wt 186.8 lb

## 2014-01-28 DIAGNOSIS — E1140 Type 2 diabetes mellitus with diabetic neuropathy, unspecified: Secondary | ICD-10-CM | POA: Diagnosis not present

## 2014-01-28 DIAGNOSIS — Z7982 Long term (current) use of aspirin: Secondary | ICD-10-CM | POA: Insufficient documentation

## 2014-01-28 DIAGNOSIS — I2510 Atherosclerotic heart disease of native coronary artery without angina pectoris: Secondary | ICD-10-CM | POA: Diagnosis not present

## 2014-01-28 DIAGNOSIS — E139 Other specified diabetes mellitus without complications: Secondary | ICD-10-CM

## 2014-01-28 DIAGNOSIS — I10 Essential (primary) hypertension: Secondary | ICD-10-CM | POA: Insufficient documentation

## 2014-01-28 DIAGNOSIS — K029 Dental caries, unspecified: Secondary | ICD-10-CM | POA: Insufficient documentation

## 2014-01-28 DIAGNOSIS — Z794 Long term (current) use of insulin: Secondary | ICD-10-CM | POA: Diagnosis not present

## 2014-01-28 DIAGNOSIS — Z79899 Other long term (current) drug therapy: Secondary | ICD-10-CM | POA: Diagnosis not present

## 2014-01-28 DIAGNOSIS — Z8673 Personal history of transient ischemic attack (TIA), and cerebral infarction without residual deficits: Secondary | ICD-10-CM | POA: Diagnosis not present

## 2014-01-28 DIAGNOSIS — I25119 Atherosclerotic heart disease of native coronary artery with unspecified angina pectoris: Secondary | ICD-10-CM

## 2014-01-28 DIAGNOSIS — E785 Hyperlipidemia, unspecified: Secondary | ICD-10-CM | POA: Diagnosis not present

## 2014-01-28 DIAGNOSIS — E119 Type 2 diabetes mellitus without complications: Secondary | ICD-10-CM | POA: Diagnosis present

## 2014-01-28 DIAGNOSIS — Z7902 Long term (current) use of antithrombotics/antiplatelets: Secondary | ICD-10-CM | POA: Insufficient documentation

## 2014-01-28 LAB — POCT GLYCOSYLATED HEMOGLOBIN (HGB A1C): Hemoglobin A1C: 7.4

## 2014-01-28 LAB — GLUCOSE, POCT (MANUAL RESULT ENTRY): POC Glucose: 107 mg/dl — AB (ref 70–99)

## 2014-01-28 MED ORDER — INSULIN GLARGINE 100 UNIT/ML SOLOSTAR PEN
PEN_INJECTOR | SUBCUTANEOUS | Status: DC
Start: 2014-01-28 — End: 2014-04-19

## 2014-01-28 MED ORDER — GABAPENTIN 400 MG PO CAPS: 400.0000 mg | ORAL_CAPSULE | Freq: Three times a day (TID) | ORAL | Status: DC

## 2014-01-28 MED ORDER — CLOPIDOGREL BISULFATE 75 MG PO TABS: 75.0000 mg | ORAL_TABLET | Freq: Every day | ORAL | Status: DC

## 2014-01-28 MED ORDER — METFORMIN HCL 1000 MG PO TABS: 1000.0000 mg | ORAL_TABLET | Freq: Two times a day (BID) | ORAL | Status: DC

## 2014-01-28 MED ORDER — LISINOPRIL 2.5 MG PO TABS: 2.5000 mg | ORAL_TABLET | Freq: Every day | ORAL | Status: DC

## 2014-01-28 MED ORDER — INSULIN ASPART 100 UNIT/ML FLEXPEN: PEN_INJECTOR | SUBCUTANEOUS | Status: DC

## 2014-01-28 MED ORDER — NITROGLYCERIN 0.4 MG SL SUBL: 0.4000 mg | SUBLINGUAL_TABLET | SUBLINGUAL | Status: DC | PRN

## 2014-01-28 MED ORDER — ATORVASTATIN CALCIUM 20 MG PO TABS: 20.0000 mg | ORAL_TABLET | Freq: Every day | ORAL | Status: DC

## 2014-01-28 NOTE — Progress Notes (Signed)
MRN: [id number] Name: Kathleen Holt  Sex: female Age: 59 y.o. DOB: 02-04-1955  Allergies: Review of patient's allergies indicates no known allergies.  Chief Complaint  Patient presents with  . Follow-up    HPI: Patient is 59 y.o. female who has  history of CAD hypertension diabetes,comes today for followup, , she also follows up with her endocrinologist and today her hemoglobin A1c is 7.4% which is improved from before, again reinforced diet modification, she is already on metformin, Lantus, NovoLog, victoza and is requesting refill on the medications, she already has scheduled appointment with her endocrinologist who orders are blood work, patient is requesting referral to see a dentist since she has dental cavities   Past Medical History  Diagnosis Date  . Diabetes mellitus without complication   . Coronary artery disease     a. s/p stenting 11/2009 [facility]  . Hyperlipidemia   . Diabetic neuropathy   . Ejection fraction      EF 45-50%,  echo, February 10, 2012, akinesis posterior lateral wall, diastolic dysfunction, mild mitral regurgitation,  . Mitral regurgitation     Mild, echo, January, 2014  . Stroke     Past Surgical History  Procedure Laterality Date  . Eye surgery    . Abdominal hysterectomy        Medication List       This list is accurate as of: 01/28/14 12:25 PM.  Always use your most recent med list.               ACCU-CHEK AVIVA PLUS W/DEVICE Kit  Use to check blood sugar 2 times per day dx code E11.40     ACCU-CHEK SOFTCLIX LANCETS lancets  Use as instructed to check blood sugar 2 times per day dx code E11.40     aspirin 81 MG tablet  Take 4 tablets (325 mg total) by mouth daily.     atorvastatin 20 MG tablet  Commonly known as:  LIPITOR  Take 1 tablet (20 mg total) by mouth daily.     carvedilol 3.125 MG tablet  Commonly known as:  COREG  Take 1 tablet (3.125 mg total) by mouth 2 (two) times daily with a  meal.     clopidogrel 75 MG tablet  Commonly known as:  PLAVIX  Take 1 tablet (75 mg total) by mouth daily.     gabapentin 400 MG capsule  Commonly known as:  NEURONTIN  Take 1 capsule (400 mg total) by mouth 3 (three) times daily. Takes 2 tablets 3 times per day     glucose blood test strip  Commonly known as:  ACCU-CHEK AVIVA PLUS  Use as instructed to check blood sugar 2 times per day dx code E11.40     insulin aspart 100 UNIT/ML FlexPen  Commonly known as:  NOVOLOG  Inject 12-16 units three times a day depending on meal     Insulin Glargine 100 UNIT/ML Solostar Pen  Commonly known as:  LANTUS  Inject 32 units daily, if am sugar is < 100 reduce to 28 units     Insulin Pen Needle 31G X 5 MM Misc  Use 4 per day to inject insulin     Liraglutide 18 MG/3ML Sopn  Inject 1.2 mg into the skin daily.     lisinopril 2.5 MG tablet  Commonly known as:  PRINIVIL,ZESTRIL  Take 1 tablet (2.5 mg total) by mouth daily.     metFORMIN 1000 MG tablet  Commonly known  as:  GLUCOPHAGE  Take 1 tablet (1,000 mg total) by mouth 2 (two) times daily with a meal.     nitroGLYCERIN 0.4 MG SL tablet  Commonly known as:  NITROSTAT  Place 1 tablet (0.4 mg total) under the tongue every 5 (five) minutes x 3 doses as needed for chest pain.     pantoprazole 40 MG tablet  Commonly known as:  PROTONIX  Take 1 tablet (40 mg total) by mouth daily.     Polyethyl Glycol-Propyl Glycol 0.4-0.3 % Soln  1 drop in each eye every 3 hours as needed.     Vitamin D (Ergocalciferol) 50000 UNITS Caps capsule  Commonly known as:  DRISDOL  Take 1 capsule (50,000 Units total) by mouth every 7 (seven) days.        Meds ordered this encounter  Medications  . atorvastatin (LIPITOR) 20 MG tablet    Sig: Take 1 tablet (20 mg total) by mouth daily.    Dispense:  90 tablet    Refill:  3  . clopidogrel (PLAVIX) 75 MG tablet    Sig: Take 1 tablet (75 mg total) by mouth daily.    Dispense:  90 tablet    Refill:  3    . gabapentin (NEURONTIN) 400 MG capsule    Sig: Take 1 capsule (400 mg total) by mouth 3 (three) times daily. Takes 2 tablets 3 times per day    Dispense:  60 capsule    Refill:  2  . insulin aspart (NOVOLOG) 100 UNIT/ML FlexPen    Sig: Inject 12-16 units three times a day depending on meal    Dispense:  15 mL    Refill:  3  . Insulin Glargine (LANTUS) 100 UNIT/ML Solostar Pen    Sig: Inject 32 units daily, if am sugar is < 100 reduce to 28 units    Dispense:  15 mL    Refill:  1  . lisinopril (PRINIVIL,ZESTRIL) 2.5 MG tablet    Sig: Take 1 tablet (2.5 mg total) by mouth daily.    Dispense:  90 tablet    Refill:  3  . metFORMIN (GLUCOPHAGE) 1000 MG tablet    Sig: Take 1 tablet (1,000 mg total) by mouth 2 (two) times daily with a meal.    Dispense:  60 tablet    Refill:  3  . nitroGLYCERIN (NITROSTAT) 0.4 MG SL tablet    Sig: Place 1 tablet (0.4 mg total) under the tongue every 5 (five) minutes x 3 doses as needed for chest pain.    Dispense:  90 tablet    Refill:  3    Immunization History  Administered Date(s) Administered  . Influenza Split 09/23/2012  . Influenza,inj,Quad PF,36+ Mos 11/11/2013    Family History  Problem Relation Age of Onset  . CAD Father     MI at age 71  . Diabetes Father   . Hypertension Father   . Cancer Father   . CAD Brother     History  Substance Use Topics  . Smoking status: Never Smoker   . Smokeless tobacco: Never Used  . Alcohol Use: No    Review of Systems   As noted in HPI  Filed Vitals:   01/28/14 1143  BP: 118/75  Pulse: 77  Temp: 98 F (36.7 C)  Resp: 16    Physical Exam  Physical Exam  Constitutional: No distress.  HENT:  Dental cavities   Eyes: EOM are normal. Pupils are equal, round, and reactive  to light.  Cardiovascular: Normal rate and regular rhythm.   Pulmonary/Chest: Breath sounds normal. No respiratory distress. She has no wheezes. She has no rales.  Musculoskeletal: She exhibits no edema.     CBC    Component Value Date/Time   WBC 9.0 05/07/2013 1133   RBC 5.30* 05/07/2013 1133   HGB 12.1 05/07/2013 1133   HCT 36.8 05/07/2013 1133   PLT 353 05/07/2013 1133   MCV 69.4* 05/07/2013 1133   LYMPHSABS 2.3 05/07/2013 1133   MONOABS 0.5 05/07/2013 1133   EOSABS 0.1 05/07/2013 1133   BASOSABS 0.0 05/07/2013 1133    CMP     Component Value Date/Time   NA 138 09/22/2013 0923   K 4.7 09/22/2013 0923   CL 105 09/22/2013 0923   CO2 26 09/22/2013 0923   GLUCOSE 92 09/22/2013 0923   BUN 13 09/22/2013 0923   CREATININE 1.0 09/22/2013 0923   CREATININE 0.94 05/07/2013 1133   CALCIUM 8.8 09/22/2013 0923   PROT 7.5 05/07/2013 1133   ALBUMIN 4.0 05/07/2013 1133   AST 17 05/07/2013 1133   ALT 18 05/07/2013 1133   ALKPHOS 71 05/07/2013 1133   BILITOT 0.4 05/07/2013 1133   GFRNONAA 66 05/07/2013 1133   GFRNONAA 79* 04/14/2012 1051   GFRAA 76 05/07/2013 1133   GFRAA >90 04/14/2012 1051    Lab Results  Component Value Date/Time   CHOL 85 09/22/2013 09:23 AM    No components found for: HGA1C  Lab Results  Component Value Date/Time   AST 17 05/07/2013 11:33 AM    Assessment and Plan  Other specified diabetes mellitus without complications - Plan:  Results for orders placed or performed in visit on 01/28/14  Glucose (CBG)  Result Value Ref Range   POC Glucose 107.0 (A) 70 - 99 mg/dl  HgB A1c  Result Value Ref Range   Hemoglobin A1C 7.40    , HgB A1c is trending down, patient is given refill on the medications, she is scheduled to followup with  her endocrinologist, insulin aspart (NOVOLOG) 100 UNIT/ML FlexPen, Insulin Glargine (LANTUS) 100 UNIT/ML Solostar Pen, metFORMIN (GLUCOPHAGE) 1000 MG tablet  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  Lorayne Marek, MD

## 2014-01-28 NOTE — Progress Notes (Signed)
Patient here with interpreter Here for her routine three month follow up on her diabetes

## 2014-02-22 ENCOUNTER — Other Ambulatory Visit: Payer: Self-pay

## 2014-02-22 MED ORDER — GABAPENTIN 400 MG PO CAPS: 400.0000 mg | ORAL_CAPSULE | Freq: Three times a day (TID) | ORAL | Status: DC

## 2014-03-03 ENCOUNTER — Other Ambulatory Visit: Payer: Self-pay | Admitting: Internal Medicine

## 2014-03-03 NOTE — Telephone Encounter (Signed)
Rx was e-scribed on  02/22/2014 Called [facility] for verification, prescription ready to be pick up Pt was inotified

## 2014-03-03 NOTE — Telephone Encounter (Signed)
Patient is calling to request a med refill for Gabapentin, patient uses [facility]. Please f/u with pt.

## 2014-03-04 ENCOUNTER — Telehealth: Payer: Self-pay | Admitting: Emergency Medicine

## 2014-03-04 NOTE — Telephone Encounter (Signed)
Called to verify pt info, received a fax from pharmacy without enough pt identifiers.

## 2014-03-08 ENCOUNTER — Telehealth: Payer: Self-pay | Admitting: Internal Medicine

## 2014-03-08 NOTE — Telephone Encounter (Signed)
Pt is calling regarding medication refill, please f/u with pt .

## 2014-03-08 NOTE — Telephone Encounter (Signed)
Langley Gauss can you take care of this

## 2014-03-09 ENCOUNTER — Telehealth: Payer: Self-pay | Admitting: Internal Medicine

## 2014-03-09 ENCOUNTER — Other Ambulatory Visit: Payer: Self-pay

## 2014-03-09 MED ORDER — GABAPENTIN 400 MG PO CAPS: 400.0000 mg | ORAL_CAPSULE | Freq: Three times a day (TID) | ORAL | Status: DC

## 2014-03-09 NOTE — Telephone Encounter (Signed)
Patient is calling because he says he did not receive the right quantity of pills; please f/u with patient about this inquiry

## 2014-03-10 ENCOUNTER — Telehealth: Payer: Self-pay

## 2014-03-10 NOTE — Telephone Encounter (Signed)
Returned patient phone call Patient not available Left message on voice mail to return our call 

## 2014-03-10 NOTE — Telephone Encounter (Signed)
Returned patient phone call Patient is aware that the pharmacy confirmed receipt of the gabepentin prescription Patient is aware to call the pharmacy to verify

## 2014-03-30 ENCOUNTER — Other Ambulatory Visit: Payer: Self-pay | Admitting: Internal Medicine

## 2014-04-13 ENCOUNTER — Other Ambulatory Visit: Payer: Self-pay | Admitting: *Deleted

## 2014-04-13 ENCOUNTER — Other Ambulatory Visit (INDEPENDENT_AMBULATORY_CARE_PROVIDER_SITE_OTHER): Payer: Medicaid Other

## 2014-04-13 DIAGNOSIS — E1140 Type 2 diabetes mellitus with diabetic neuropathy, unspecified: Secondary | ICD-10-CM

## 2014-04-13 DIAGNOSIS — E1149 Type 2 diabetes mellitus with other diabetic neurological complication: Secondary | ICD-10-CM

## 2014-04-13 DIAGNOSIS — E1165 Type 2 diabetes mellitus with hyperglycemia: Secondary | ICD-10-CM

## 2014-04-13 DIAGNOSIS — IMO0002 Reserved for concepts with insufficient information to code with codable children: Secondary | ICD-10-CM

## 2014-04-13 LAB — URINALYSIS, ROUTINE W REFLEX MICROSCOPIC
Bilirubin Urine: NEGATIVE
Hgb urine dipstick: NEGATIVE
Ketones, ur: NEGATIVE
Leukocytes, UA: NEGATIVE
NITRITE: NEGATIVE
RBC / HPF: NONE SEEN (ref 0–?)
TOTAL PROTEIN, URINE-UPE24: NEGATIVE
URINE GLUCOSE: NEGATIVE
Urobilinogen, UA: 0.2 (ref 0.0–1.0)
pH: 5.5 (ref 5.0–8.0)

## 2014-04-13 LAB — BASIC METABOLIC PANEL
BUN: 15 mg/dL (ref 6–23)
CO2: 26 mEq/L (ref 19–32)
Calcium: 9.2 mg/dL (ref 8.4–10.5)
Chloride: 103 mEq/L (ref 96–112)
Creatinine, Ser: 0.98 mg/dL (ref 0.40–1.20)
GFR: 61.33 mL/min (ref >60.00)
GLUCOSE: 99 mg/dL (ref 70–99)
POTASSIUM: 4.6 meq/L (ref 3.5–5.1)
Sodium: 135 mEq/L (ref 135–145)

## 2014-04-13 LAB — MICROALBUMIN / CREATININE URINE RATIO
CREATININE, U: 39.3 mg/dL
MICROALB UR: 2.7 mg/dL — AB (ref 0.0–1.9)
MICROALB/CREAT RATIO: 6.9 mg/g (ref 0.0–30.0)

## 2014-04-13 LAB — HEMOGLOBIN A1C: HEMOGLOBIN A1C: 7.8 % — AB (ref 4.6–6.5)

## 2014-04-13 LAB — GLUCOSE, RANDOM: Glucose, Bld: 99 mg/dL (ref 70–99)

## 2014-04-13 MED ORDER — INSULIN PEN NEEDLE 31G X 5 MM MISC: Status: DC

## 2014-04-15 ENCOUNTER — Other Ambulatory Visit: Payer: Medicaid Other

## 2014-04-19 ENCOUNTER — Encounter: Payer: Self-pay | Admitting: Endocrinology

## 2014-04-19 ENCOUNTER — Ambulatory Visit (INDEPENDENT_AMBULATORY_CARE_PROVIDER_SITE_OTHER): Payer: Medicaid Other | Admitting: Endocrinology

## 2014-04-19 ENCOUNTER — Other Ambulatory Visit: Payer: Self-pay | Admitting: *Deleted

## 2014-04-19 VITALS — BP 124/68 | HR 94 | Temp 97.8°F | Resp 16 | Ht 60.0 in | Wt 187.2 lb

## 2014-04-19 DIAGNOSIS — E1149 Type 2 diabetes mellitus with other diabetic neurological complication: Secondary | ICD-10-CM

## 2014-04-19 DIAGNOSIS — E139 Other specified diabetes mellitus without complications: Secondary | ICD-10-CM

## 2014-04-19 DIAGNOSIS — I10 Essential (primary) hypertension: Secondary | ICD-10-CM | POA: Diagnosis not present

## 2014-04-19 DIAGNOSIS — E785 Hyperlipidemia, unspecified: Secondary | ICD-10-CM | POA: Diagnosis not present

## 2014-04-19 DIAGNOSIS — E1140 Type 2 diabetes mellitus with diabetic neuropathy, unspecified: Secondary | ICD-10-CM

## 2014-04-19 MED ORDER — LIRAGLUTIDE 18 MG/3ML ~~LOC~~ SOPN: 1.2000 mg | PEN_INJECTOR | Freq: Every day | SUBCUTANEOUS | Status: DC

## 2014-04-19 MED ORDER — INSULIN GLARGINE 100 UNIT/ML SOLOSTAR PEN: PEN_INJECTOR | SUBCUTANEOUS | Status: DC

## 2014-04-19 MED ORDER — METFORMIN HCL 1000 MG PO TABS: 1000.0000 mg | ORAL_TABLET | Freq: Two times a day (BID) | ORAL | Status: DC

## 2014-04-19 MED ORDER — INSULIN ASPART 100 UNIT/ML FLEXPEN: PEN_INJECTOR | SUBCUTANEOUS | Status: DC

## 2014-04-19 NOTE — Patient Instructions (Addendum)
Lantus 30 units and change to morning; and if sugar still low at night reduce to 28  Victoza 5 clicks after 0.6 dose and further if no side effects  Please check blood sugars at least half the time about 2 hours after any meal and 3 times per week on waking up. Please bring blood sugar monitor to each visit. Recommended blood sugar levels about 2 hours after meal is 140-180 and on waking up 90-130

## 2014-04-19 NOTE — Progress Notes (Signed)
Patient ID: Kathleen Holt, female   DOB: 01-29-1955, 59 y.o.   MRN: [id number]    Reason for Appointment: Follow-up of type 2 Diabetes  History of Present Illness:          Diagnosis: Type 2 diabetes mellitus, date of diagnosis: 2005        Past history: The diabetes was diagnosed about 10 years ago and she was probably treated with metformin and other oral agents for at least 6 years. Previously she had tried metformin, Januvia and Amaryl When she was admitted for coronary disease in 2011 in [place] she was switched to insulin and oral agents. Not clear how her control has been in the past She has been on Lantus and NovoLog insulin since 2011 She also had tried Byetta with her insulin for sometime but not clear if it was helping her  Because of her poor control and persistently high A1c of 10.1% in 04/2013 her metformin was increased to maximum dose. Also she was started on Victoza but she was unable to tolerate the 1.2 mg dosage since she had decreased appetite and abdominal distress  Recent history:  INSULIN regimen is described as:  Lantus 32 at bedtime, NovoLog 10-12 units ac tid  Her A1c continues to be over 7% and visually higher this time at 7.7 Difficult to get her to check her blood sugars adequately or bring any proper record for analysis Still has not started using her Accu-Chek monitor which was given to her She thinks her fasting blood sugars are generally fairly good but probably has some high postprandial readings which she may not be checking enough off  Tolerating Victoza fairly well  but is now doing only 3 clicks beyond the 0.6 dose Although  she thinks she is trying to walk a little bit she has not lost any significant amount of weight recently even with Victoza. Also with Victoza she ppears to be needing a little less NovoLog at mealtimes but this has not changed recently   She does now report occasional symptoms of overnight hypoglycemia with taking her basal  insulin at night        Oral hypoglycemic drugs the patient is taking are: Metformin 2 g daily      Side effects from medications have been: abdominal distress and anorexia with 1.2 Victoza  Glucose monitoring:  done once or twice a day        Glucometer: One Touch Ultra Blood Glucose readings from recall  PRE-MEAL Breakfast Lunch Dinner  overnight  Overall  Glucose range:   110-120    62-73   Mean/median:        POST-MEAL PC Breakfast PC Lunch PC Dinner  Glucose range: ?   115 135-145  Mean/median:       Glycemic control:  Fructosamine 272 in 9/15  Lab Results  Component Value Date   HGBA1C 7.8* 04/13/2014   HGBA1C 7.40 01/28/2014   HGBA1C 7.7 11/11/2013   Lab Results  Component Value Date   MICROALBUR 2.7* 04/13/2014   LDLCALC 23 09/22/2013   CREATININE 0.98 04/13/2014    Self-care: The diet that the patient has been following is: None, usually vegetarian      Exercise: a little        Dietician visit:  none              Compliance with the medical regimen: Fair  Weight history:  Wt Readings from Last 3 Encounters:  04/19/14 187 lb 3.2 oz (  84.913 kg)  01/28/14 186 lb 12.8 oz (84.732 kg)  01/12/14 188 lb 6.4 oz (85.458 kg)     Lab on 04/13/2014  Component Date Value Ref Range Status  . Hgb A1c MFr Bld 04/13/2014 7.8* 4.6 - 6.5 % Final   Glycemic Control Guidelines for People with Diabetes:Non Diabetic:  <6%Goal of Therapy: <7%Additional Action Suggested:  >8%   . Glucose, Bld 04/13/2014 99  70 - 99 mg/dL Final  . Microalb, Ur 04/13/2014 2.7* 0.0 - 1.9 mg/dL Final  . Creatinine,U 04/13/2014 39.3   Final  . Microalb Creat Ratio 04/13/2014 6.9  0.0 - 30.0 mg/g Final  . Color, Urine 04/13/2014 YELLOW  Yellow;Lt. Yellow Final  . APPearance 04/13/2014 CLEAR  Clear Final  . Specific Gravity, Urine 04/13/2014 <=1.005* 1.000 - 1.030 Final  . pH 04/13/2014 5.5  5.0 - 8.0 Final  . Total Protein, Urine 04/13/2014 NEGATIVE  Negative Final  . Urine Glucose 04/13/2014  NEGATIVE  Negative Final  . Ketones, ur 04/13/2014 NEGATIVE  Negative Final  . Bilirubin Urine 04/13/2014 NEGATIVE  Negative Final  . Hgb urine dipstick 04/13/2014 NEGATIVE  Negative Final  . Urobilinogen, UA 04/13/2014 0.2  0.0 - 1.0 Final  . Leukocytes, UA 04/13/2014 NEGATIVE  Negative Final  . Nitrite 04/13/2014 NEGATIVE  Negative Final  . WBC, UA 04/13/2014 0-2/hpf  0-2/hpf Final  . RBC / HPF 04/13/2014 none seen  0-2/hpf Final  . Squamous Epithelial / LPF 04/13/2014 Rare(0-4/hpf)  Rare(0-4/hpf) Final  . Sodium 04/13/2014 135  135 - 145 mEq/L Final  . Potassium 04/13/2014 4.6  3.5 - 5.1 mEq/L Final  . Chloride 04/13/2014 103  96 - 112 mEq/L Final  . CO2 04/13/2014 26  19 - 32 mEq/L Final  . Glucose, Bld 04/13/2014 99  70 - 99 mg/dL Final  . BUN 04/13/2014 15  6 - 23 mg/dL Final  . Creatinine, Ser 04/13/2014 0.98  0.40 - 1.20 mg/dL Final  . Calcium 04/13/2014 9.2  8.4 - 10.5 mg/dL Final  . GFR 04/13/2014 61.33  >60.00 mL/min Final      Medication List       This list is accurate as of: 04/19/14  8:50 AM.  Always use your most recent med list.               ACCU-CHEK AVIVA PLUS W/DEVICE Kit  Use to check blood sugar 2 times per day dx code E11.40     ACCU-CHEK SOFTCLIX LANCETS lancets  Use as instructed to check blood sugar 2 times per day dx code E11.40     aspirin 81 MG tablet  Take 4 tablets (325 mg total) by mouth daily.     atorvastatin 20 MG tablet  Commonly known as:  LIPITOR  Take 1 tablet (20 mg total) by mouth daily.     carvedilol 3.125 MG tablet  Commonly known as:  COREG  Take 1 tablet (3.125 mg total) by mouth 2 (two) times daily with a meal.     clopidogrel 75 MG tablet  Commonly known as:  PLAVIX  Take 1 tablet (75 mg total) by mouth daily.     gabapentin 400 MG capsule  Commonly known as:  NEURONTIN  Take 1 capsule (400 mg total) by mouth 3 (three) times daily. Takes 2 tablets 3 times per day     glucose blood test strip  Commonly known as:   ACCU-CHEK AVIVA PLUS  Use as instructed to check blood sugar 2 times per day dx  code E11.40     insulin aspart 100 UNIT/ML FlexPen  Commonly known as:  NOVOLOG  Inject 12-16 units three times a day depending on meal     Insulin Glargine 100 UNIT/ML Solostar Pen  Commonly known as:  LANTUS  Inject 32 units daily, if am sugar is < 100 reduce to 28 units     Insulin Pen Needle 31G X 5 MM Misc  Use 5 per day to inject insulin     Liraglutide 18 MG/3ML Sopn  Inject 1.2 mg into the skin daily.     lisinopril 2.5 MG tablet  Commonly known as:  PRINIVIL,ZESTRIL  Take 1 tablet (2.5 mg total) by mouth daily.     metFORMIN 1000 MG tablet  Commonly known as:  GLUCOPHAGE  TAKE 1 TABLET BY MOUTH TWICE DAILY WITH FOOD     nitroGLYCERIN 0.4 MG SL tablet  Commonly known as:  NITROSTAT  Place 1 tablet (0.4 mg total) under the tongue every 5 (five) minutes x 3 doses as needed for chest pain.     pantoprazole 40 MG tablet  Commonly known as:  PROTONIX  Take 1 tablet (40 mg total) by mouth daily.     Polyethyl Glycol-Propyl Glycol 0.4-0.3 % Soln  1 drop in each eye every 3 hours as needed.     Vitamin D (Ergocalciferol) 50000 UNITS Caps capsule  Commonly known as:  DRISDOL  Take 1 capsule (50,000 Units total) by mouth every 7 (seven) days.        Allergies: No Known Allergies  Past Medical History  Diagnosis Date  . Diabetes mellitus without complication   . Coronary artery disease     a. s/p stenting 11/2009 [facility]  . Hyperlipidemia   . Diabetic neuropathy   . Ejection fraction      EF 45-50%,  echo, February 10, 2012, akinesis posterior lateral wall, diastolic dysfunction, mild mitral regurgitation,  . Mitral regurgitation     Mild, echo, January, 2014  . Stroke     Past Surgical History  Procedure Laterality Date  . Eye surgery    . Abdominal hysterectomy      Family History  Problem Relation Age of Onset  . CAD Father     MI at age 23   . Diabetes Father   . Hypertension Father   . Cancer Father   . CAD Brother     Social History:  reports that she has never smoked. She has never used smokeless tobacco. She reports that she does not drink alcohol or use illicit drugs.    Review of Systems   Eye exam: Last in  3/16      Lipids: Has mixed dyslipidemia. On Crestor since her hospitalization for coronary artery disease in 2011       Lab Results  Component Value Date   CHOL 85 09/22/2013   HDL 30.60* 09/22/2013   LDLCALC 23 09/22/2013   TRIG 157.0* 09/22/2013   CHOLHDL 3 09/22/2013        She has a long-standing history of Numbness, tingling in her feet treated adequately with gabapentin   Carpal tunnel syndrome: She periodically has numbness at night in both hands related by getting up and moving around.     Physical Examination:  BP 124/68 mmHg  Pulse 94  Temp(Src) 97.8 F (36.6 C)  Resp 16  Ht 5' (1.524 m)  Wt 187 lb 3.2 oz (84.913 kg)  BMI 36.56 kg/m2  SpO2 97%  No edema present  ASSESSMENT:  Diabetes type 2, uncontrolled with obesity  Although she had benefited from adding Victoza to her basal bolus insulin regimen she appears to have slightly higher A1c of 7.7 now Also not able to lose any weight  This is despite generally having small portions at meals Not able to exercise much because of low back pain and other issues  She is taking only slightly more amount than 0.6 on her Victoza and this may not be very effective Difficult to assess her control as she does not check her blood sugars adequately or bring any record or downloadable monitor FASTING blood sugars are reportedly good and was 99 in the lab but she is occasionally having nocturnal hypoglycemia indicating inconsistent action of Lantus insulin that she takes at bedtime  PLAN:  She will continue the regimen unchanged except  try increasing her Victoza by at least 2 clicks now and one click every few days until highest tolerated  dose achieved or 1.2 mg Continue same insulin doses but reduce Lantus to 30 units and take this in the morning May need to adjust Lantus dose further if fasting blood sugars or overnight readings continue to get low No change in NovoLog unless postprandial readings tend to be high  Start using Accu-Chek which is covered by her insurance and bring monitor on each visit  Patient Instructions  Lantus 30 units and change to morning; and if sugar still low at night reduce to 28  Victoza 5 clicks after 0.6 dose and further if no side effects  Please check blood sugars at least half the time about 2 hours after any meal and 3 times per week on waking up. Please bring blood sugar monitor to each visit. Recommended blood sugar levels about 2 hours after meal is 140-180 and on waking up 90-130    Counseling time over 50% of today's 25 minute visit  Lenorris Karger 04/19/2014, 8:50 AM

## 2014-06-06 ENCOUNTER — Telehealth: Payer: Self-pay | Admitting: Internal Medicine

## 2014-06-06 ENCOUNTER — Telehealth: Payer: Self-pay

## 2014-06-06 MED ORDER — GABAPENTIN 400 MG PO CAPS: 400.0000 mg | ORAL_CAPSULE | Freq: Three times a day (TID) | ORAL | Status: DC

## 2014-06-06 NOTE — Telephone Encounter (Signed)
Patient has come in today to request a medication refill on Gabapentin; please f/u with patient, patient's next appointment is 06/13/2014 @ 10:00a

## 2014-06-06 NOTE — Telephone Encounter (Signed)
Patient called requesting refill on his gabapentin [facility]

## 2014-06-08 ENCOUNTER — Ambulatory Visit: Payer: Medicaid Other | Admitting: Internal Medicine

## 2014-06-13 ENCOUNTER — Ambulatory Visit: Payer: Medicaid Other | Attending: Internal Medicine | Admitting: Internal Medicine

## 2014-06-13 ENCOUNTER — Encounter: Payer: Self-pay | Admitting: Internal Medicine

## 2014-06-13 VITALS — BP 127/83 | HR 83 | Temp 98.0°F | Resp 16 | Wt 185.4 lb

## 2014-06-13 DIAGNOSIS — M25552 Pain in left hip: Secondary | ICD-10-CM | POA: Insufficient documentation

## 2014-06-13 DIAGNOSIS — E139 Other specified diabetes mellitus without complications: Secondary | ICD-10-CM | POA: Insufficient documentation

## 2014-06-13 DIAGNOSIS — I10 Essential (primary) hypertension: Secondary | ICD-10-CM | POA: Insufficient documentation

## 2014-06-13 DIAGNOSIS — K219 Gastro-esophageal reflux disease without esophagitis: Secondary | ICD-10-CM | POA: Insufficient documentation

## 2014-06-13 DIAGNOSIS — I25119 Atherosclerotic heart disease of native coronary artery with unspecified angina pectoris: Secondary | ICD-10-CM | POA: Diagnosis not present

## 2014-06-13 DIAGNOSIS — Z794 Long term (current) use of insulin: Secondary | ICD-10-CM | POA: Diagnosis not present

## 2014-06-13 LAB — GLUCOSE, POCT (MANUAL RESULT ENTRY): POC GLUCOSE: 216 mg/dL — AB (ref 70–99)

## 2014-06-13 MED ORDER — GABAPENTIN 400 MG PO CAPS: 400.0000 mg | ORAL_CAPSULE | Freq: Three times a day (TID) | ORAL | Status: DC

## 2014-06-13 MED ORDER — METFORMIN HCL 1000 MG PO TABS: 1000.0000 mg | ORAL_TABLET | Freq: Two times a day (BID) | ORAL | Status: DC

## 2014-06-13 MED ORDER — CARVEDILOL 3.125 MG PO TABS: 3.1250 mg | ORAL_TABLET | Freq: Two times a day (BID) | ORAL | Status: DC

## 2014-06-13 MED ORDER — LISINOPRIL 2.5 MG PO TABS: 2.5000 mg | ORAL_TABLET | Freq: Every day | ORAL | Status: DC

## 2014-06-13 MED ORDER — CLOPIDOGREL BISULFATE 75 MG PO TABS: 75.0000 mg | ORAL_TABLET | Freq: Every day | ORAL | Status: DC

## 2014-06-13 MED ORDER — POLYETHYL GLYCOL-PROPYL GLYCOL 0.4-0.3 % OP SOLN: OPHTHALMIC | Status: DC

## 2014-06-13 MED ORDER — PANTOPRAZOLE SODIUM 40 MG PO TBEC: 40.0000 mg | DELAYED_RELEASE_TABLET | Freq: Every day | ORAL | Status: DC

## 2014-06-13 MED ORDER — ATORVASTATIN CALCIUM 20 MG PO TABS: 20.0000 mg | ORAL_TABLET | Freq: Every day | ORAL | Status: DC

## 2014-06-13 NOTE — Progress Notes (Signed)
MRN: [id number] Name: Kathleen Holt  Sex: female Age: 59 y.o. DOB: 1955-08-18  Allergies: Review of patient's allergies indicates no known allergies.  Chief Complaint  Patient presents with  . Follow-up    HPI: Patient is 59 y.o. female who Has history of hypertension , diabetes, CAD, comes today for followup, she is complaining of on and off left ear pain, denies any recent fall or trauma, she is also following up with her endocrinologist, patient reports occasionally her blood sugar drops to 70s, EMR reviewed apparently patient was advised to take the Lantus in the morning but as per patient she is taking at night also was recommended to reduce the dose of Lantus if needed to 28 units if her blood sugar drops. Patient understands verbalized instructions, patient is requesting refill on her medications.  Past Medical History  Diagnosis Date  . Diabetes mellitus without complication   . Coronary artery disease     a. s/p stenting 11/2009 [facility]  . Hyperlipidemia   . Diabetic neuropathy   . Ejection fraction      EF 45-50%,  echo, February 10, 2012, akinesis posterior lateral wall, diastolic dysfunction, mild mitral regurgitation,  . Mitral regurgitation     Mild, echo, January, 2014  . Stroke     Past Surgical History  Procedure Laterality Date  . Eye surgery    . Abdominal hysterectomy        Medication List       This list is accurate as of: 06/13/14 11:35 AM.  Always use your most recent med list.               ACCU-CHEK AVIVA PLUS W/DEVICE Kit  Use to check blood sugar 2 times per day dx code E11.40     ACCU-CHEK SOFTCLIX LANCETS lancets  Use as instructed to check blood sugar 2 times per day dx code E11.40     aspirin 81 MG tablet  Take 4 tablets (325 mg total) by mouth daily.     atorvastatin 20 MG tablet  Commonly known as:  LIPITOR  Take 1 tablet (20 mg total) by mouth daily.     carvedilol 3.125 MG tablet    Commonly known as:  COREG  Take 1 tablet (3.125 mg total) by mouth 2 (two) times daily with a meal.     clopidogrel 75 MG tablet  Commonly known as:  PLAVIX  Take 1 tablet (75 mg total) by mouth daily.     gabapentin 400 MG capsule  Commonly known as:  NEURONTIN  Take 1 capsule (400 mg total) by mouth 3 (three) times daily. Takes 2 tablets 3 times per day     glucose blood test strip  Commonly known as:  ACCU-CHEK AVIVA PLUS  Use as instructed to check blood sugar 2 times per day dx code E11.40     insulin aspart 100 UNIT/ML FlexPen  Commonly known as:  NOVOLOG  Inject 12-16 units three times a day depending on meal     Insulin Glargine 100 UNIT/ML Solostar Pen  Commonly known as:  LANTUS  Inject 32 units daily, if am sugar is < 100 reduce to 28 units     Insulin Pen Needle 31G X 5 MM Misc  Use 5 per day to inject insulin     Liraglutide 18 MG/3ML Sopn  Inject 0.2 mLs (1.2 mg total) into the skin daily.     lisinopril 2.5 MG tablet  Commonly known  as:  PRINIVIL,ZESTRIL  Take 1 tablet (2.5 mg total) by mouth daily.     metFORMIN 1000 MG tablet  Commonly known as:  GLUCOPHAGE  Take 1 tablet (1,000 mg total) by mouth 2 (two) times daily with a meal.     nitroGLYCERIN 0.4 MG SL tablet  Commonly known as:  NITROSTAT  Place 1 tablet (0.4 mg total) under the tongue every 5 (five) minutes x 3 doses as needed for chest pain.     pantoprazole 40 MG tablet  Commonly known as:  PROTONIX  Take 1 tablet (40 mg total) by mouth daily.     Polyethyl Glycol-Propyl Glycol 0.4-0.3 % Soln  1 drop in each eye every 3 hours as needed.     Vitamin D (Ergocalciferol) 50000 UNITS Caps capsule  Commonly known as:  DRISDOL  Take 1 capsule (50,000 Units total) by mouth every 7 (seven) days.        Meds ordered this encounter  Medications  . atorvastatin (LIPITOR) 20 MG tablet    Sig: Take 1 tablet (20 mg total) by mouth daily.    Dispense:  90 tablet    Refill:  3  . carvedilol  (COREG) 3.125 MG tablet    Sig: Take 1 tablet (3.125 mg total) by mouth 2 (two) times daily with a meal.    Dispense:  90 tablet    Refill:  3  . clopidogrel (PLAVIX) 75 MG tablet    Sig: Take 1 tablet (75 mg total) by mouth daily.    Dispense:  90 tablet    Refill:  3  . gabapentin (NEURONTIN) 400 MG capsule    Sig: Take 1 capsule (400 mg total) by mouth 3 (three) times daily. Takes 2 tablets 3 times per day    Dispense:  240 capsule    Refill:  2  . lisinopril (PRINIVIL,ZESTRIL) 2.5 MG tablet    Sig: Take 1 tablet (2.5 mg total) by mouth daily.    Dispense:  90 tablet    Refill:  3  . metFORMIN (GLUCOPHAGE) 1000 MG tablet    Sig: Take 1 tablet (1,000 mg total) by mouth 2 (two) times daily with a meal.    Dispense:  60 tablet    Refill:  3  . pantoprazole (PROTONIX) 40 MG tablet    Sig: Take 1 tablet (40 mg total) by mouth daily.    Dispense:  30 tablet    Refill:  6  . Polyethyl Glycol-Propyl Glycol 0.4-0.3 % SOLN    Sig: 1 drop in each eye every 3 hours as needed.    Dispense:  10 mL    Refill:  12    Immunization History  Administered Date(s) Administered  . Influenza Split 09/23/2012  . Influenza,inj,Quad PF,36+ Mos 11/11/2013    Family History  Problem Relation Age of Onset  . CAD Father     MI at age 17  . Diabetes Father   . Hypertension Father   . Cancer Father   . CAD Brother     History  Substance Use Topics  . Smoking status: Never Smoker   . Smokeless tobacco: Never Used  . Alcohol Use: No    Review of Systems   As noted in HPI  Filed Vitals:   06/13/14 1044  BP: 127/83  Pulse: 83  Temp: 98 F (36.7 C)  Resp: 16    Physical Exam  Physical Exam  Constitutional: No distress.  Eyes: EOM are normal. Pupils are equal,  round, and reactive to light.  Cardiovascular: Normal rate and regular rhythm.   Pulmonary/Chest: Breath sounds normal. No respiratory distress. She has no wheezes. She has no rales.  Musculoskeletal: She exhibits no  edema.    CBC    Component Value Date/Time   WBC 9.0 05/07/2013 1133   RBC 5.30* 05/07/2013 1133   HGB 12.1 05/07/2013 1133   HCT 36.8 05/07/2013 1133   PLT 353 05/07/2013 1133   MCV 69.4* 05/07/2013 1133   LYMPHSABS 2.3 05/07/2013 1133   MONOABS 0.5 05/07/2013 1133   EOSABS 0.1 05/07/2013 1133   BASOSABS 0.0 05/07/2013 1133    CMP     Component Value Date/Time   NA 135 04/13/2014 1017   K 4.6 04/13/2014 1017   CL 103 04/13/2014 1017   CO2 26 04/13/2014 1017   GLUCOSE 99 04/13/2014 1017   GLUCOSE 99 04/13/2014 1017   BUN 15 04/13/2014 1017   CREATININE 0.98 04/13/2014 1017   CREATININE 0.94 05/07/2013 1133   CALCIUM 9.2 04/13/2014 1017   PROT 7.5 05/07/2013 1133   ALBUMIN 4.0 05/07/2013 1133   AST 17 05/07/2013 1133   ALT 18 05/07/2013 1133   ALKPHOS 71 05/07/2013 1133   BILITOT 0.4 05/07/2013 1133   GFRNONAA 66 05/07/2013 1133   GFRNONAA 79* 04/14/2012 1051   GFRAA 76 05/07/2013 1133   GFRAA >90 04/14/2012 1051    Lab Results  Component Value Date/Time   CHOL 85 09/22/2013 09:23 AM    Lab Results  Component Value Date/Time   HGBA1C 7.8* 04/13/2014 10:17 AM   HGBA1C 7.40 01/28/2014 11:40 AM    Lab Results  Component Value Date/Time   AST 17 05/07/2013 11:33 AM    Assessment and Plan  Other specified diabetes mellitus without complications - Plan:  Results for orders placed or performed in visit on 06/13/14  Glucose (CBG)  Result Value Ref Range   POC Glucose 216 (A) 70 - 99 mg/dl   Diabetes is currently being managed by her endocrinologist her last hemoglobin A1c was 7.8% at that time she was advised to increase the dose of victoza , she will start taking Lantus in the morning and if her blood sugar drops in 70s, she'll reduce the dose to 28 units, continue with NovoLog, metFORMIN (GLUCOPHAGE) 1000 MG tablet, she already has scheduled follow with her endocrinologist.  Coronary artery disease with unspecified angina pectoris - Plan: atorvastatin  (LIPITOR) 20 MG tablet, carvedilol (COREG) 3.125 MG tablet, clopidogrel (PLAVIX) 75 MG tablet  Essential hypertension, benign - Plan:blood pressure is well controlled, continue with  lisinopril (PRINIVIL,ZESTRIL) 2.5 MG tablet  Left hip pain - Plan: DG HIP UNILAT WITH PELVIS 2-3 VIEWS LEFT  Gastroesophageal reflux disease, esophagitis presence not specified - Plan: lifestyle modification, continue with pantoprazole (PROTONIX) 40 MG tablet    Return in about 3 months (around 09/13/2014), or if symptoms worsen or fail to improve.   This note has been created with Surveyor, quantity. Any transcriptional errors are unintentional.    Lorayne Marek, MD

## 2014-06-13 NOTE — Progress Notes (Signed)
Patient here for follow up on her diabetes and is in need Of medication refills

## 2014-06-22 ENCOUNTER — Ambulatory Visit: Payer: Medicaid Other | Admitting: Cardiology

## 2014-06-28 IMAGING — CR DG CHEST 1V PORT SAME DAY
1 series · 1 of 1 positions shown · non-contrast
Comparison: Portable exam 4306 hours compared to 02/10/2012

CLINICAL DATA: Chest pain, CHF

PORTABLE CHEST - 1 VIEW SAME DAY

[AP]
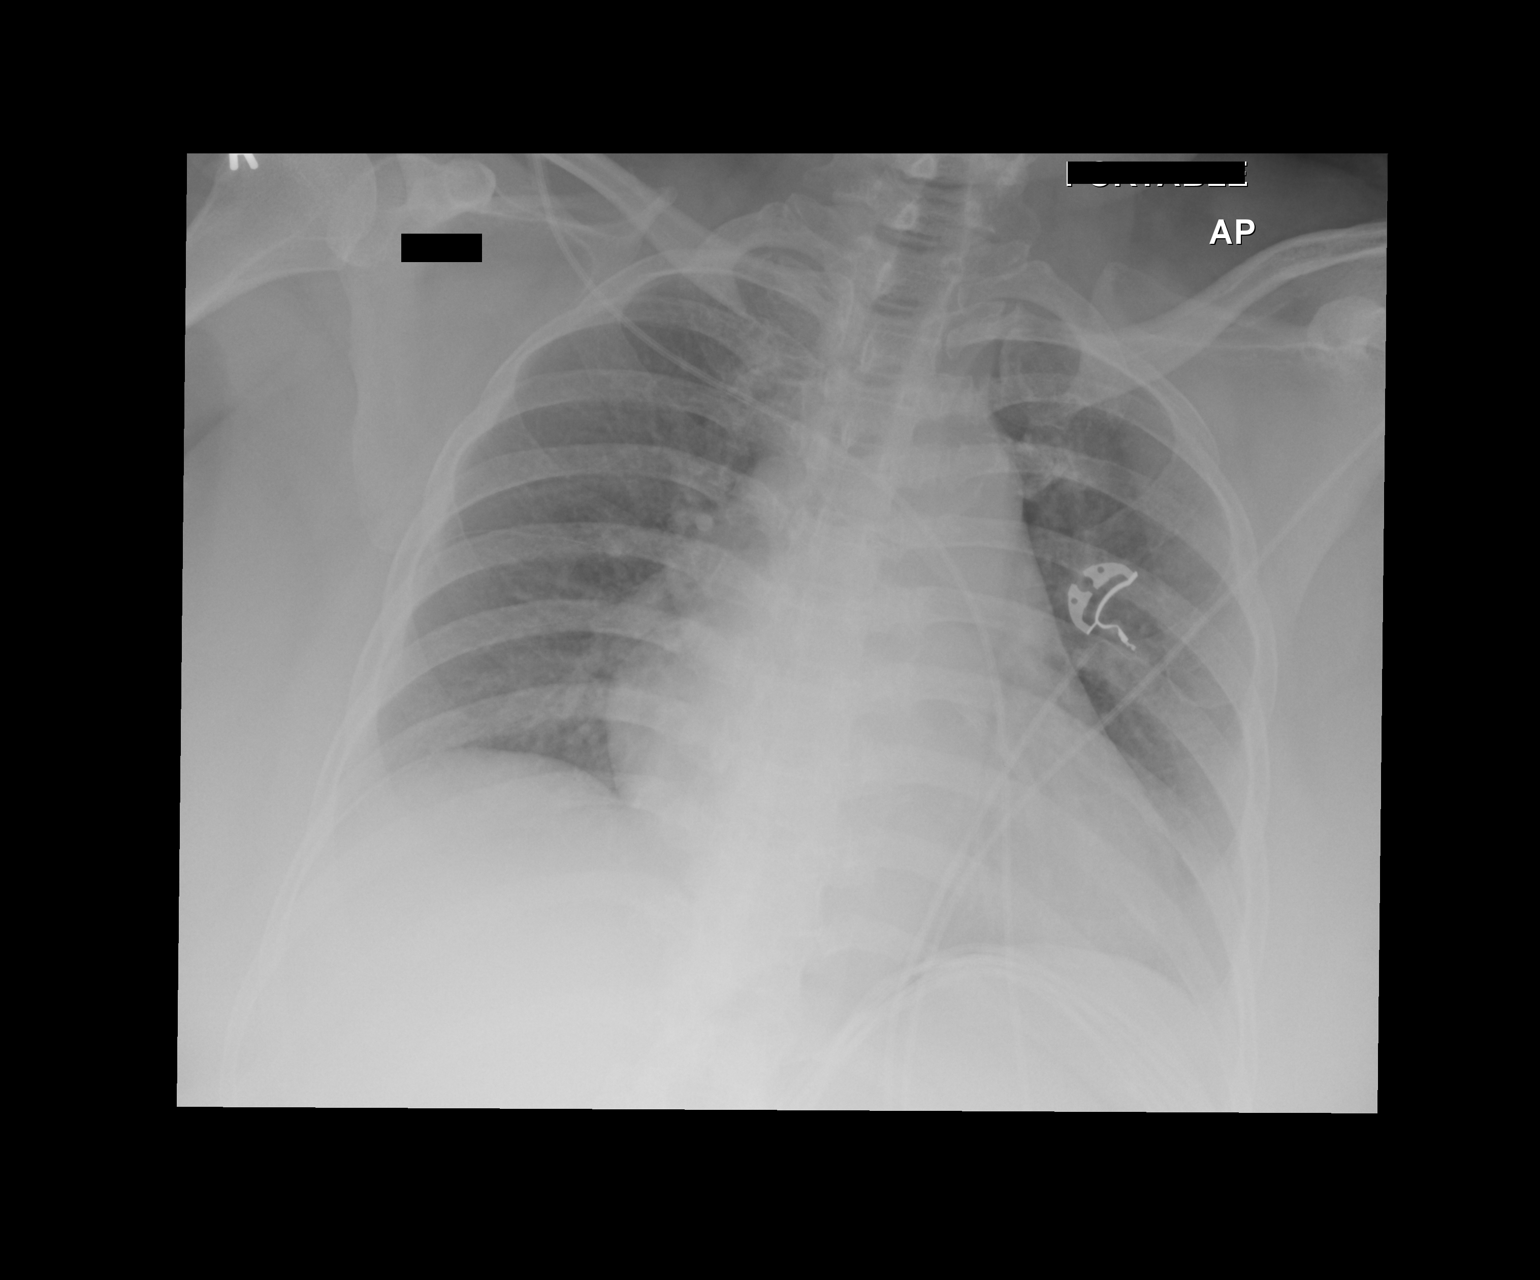

[1 of 1 positions shown; findings below may reference images not displayed]

FINDINGS: Enlargement of cardiac silhouette with pulmonary vascular
congestion.
Mediastinal contours stable with mild elongation of the thoracic
aorta noted.
No acute failure or consolidation.
Bones unremarkable.
No pleural effusion or pneumothorax.
IMPRESSION: Enlargement of cardiac silhouette with pulmonary vascular
congestion.
No acute abnormalities.

## 2014-07-13 ENCOUNTER — Ambulatory Visit: Payer: Medicaid Other | Admitting: Cardiology

## 2014-07-15 ENCOUNTER — Other Ambulatory Visit: Payer: Medicaid Other

## 2014-07-20 ENCOUNTER — Encounter: Payer: Self-pay | Admitting: Endocrinology

## 2014-07-20 ENCOUNTER — Ambulatory Visit (INDEPENDENT_AMBULATORY_CARE_PROVIDER_SITE_OTHER): Payer: Medicaid Other | Admitting: Endocrinology

## 2014-07-20 VITALS — BP 112/70 | HR 81 | Temp 97.9°F | Resp 16 | Ht 60.0 in | Wt 183.6 lb

## 2014-07-20 DIAGNOSIS — E1140 Type 2 diabetes mellitus with diabetic neuropathy, unspecified: Secondary | ICD-10-CM

## 2014-07-20 DIAGNOSIS — I10 Essential (primary) hypertension: Secondary | ICD-10-CM

## 2014-07-20 DIAGNOSIS — E113299 Type 2 diabetes mellitus with mild nonproliferative diabetic retinopathy without macular edema, unspecified eye: Secondary | ICD-10-CM

## 2014-07-20 DIAGNOSIS — E11329 Type 2 diabetes mellitus with mild nonproliferative diabetic retinopathy without macular edema: Secondary | ICD-10-CM

## 2014-07-20 DIAGNOSIS — E1149 Type 2 diabetes mellitus with other diabetic neurological complication: Secondary | ICD-10-CM

## 2014-07-20 DIAGNOSIS — E785 Hyperlipidemia, unspecified: Secondary | ICD-10-CM | POA: Diagnosis not present

## 2014-07-20 LAB — COMPREHENSIVE METABOLIC PANEL
ALT: 18 U/L (ref 0–35)
AST: 19 U/L (ref 0–37)
Albumin: 4.2 g/dL (ref 3.5–5.2)
Alkaline Phosphatase: 65 U/L (ref 39–117)
BUN: 11 mg/dL (ref 6–23)
CO2: 28 mEq/L (ref 19–32)
Calcium: 9.5 mg/dL (ref 8.4–10.5)
Chloride: 103 mEq/L (ref 96–112)
Creatinine, Ser: 0.94 mg/dL (ref 0.40–1.20)
GFR: 64.29 mL/min (ref >60.00)
GLUCOSE: 96 mg/dL (ref 70–99)
POTASSIUM: 4.6 meq/L (ref 3.5–5.1)
Sodium: 138 mEq/L (ref 135–145)
TOTAL PROTEIN: 7.7 g/dL (ref 6.0–8.3)
Total Bilirubin: 0.3 mg/dL (ref 0.2–1.2)

## 2014-07-20 LAB — TSH: TSH: 3.02 u[IU]/mL (ref 0.35–4.50)

## 2014-07-20 LAB — LIPID PANEL
CHOLESTEROL: 100 mg/dL (ref 0–200)
HDL: 36.9 mg/dL — ABNORMAL LOW (ref >39.00)
LDL CALC: 36 mg/dL (ref 0–99)
NonHDL: 63.1
TRIGLYCERIDES: 138 mg/dL (ref 0.0–149.0)
Total CHOL/HDL Ratio: 3
VLDL: 27.6 mg/dL (ref 0.0–40.0)

## 2014-07-20 LAB — HEMOGLOBIN A1C: Hgb A1c MFr Bld: 7.7 % — ABNORMAL HIGH (ref 4.6–6.5)

## 2014-07-20 NOTE — Patient Instructions (Addendum)
Lantus 30 units  on walking up  Victoza 2-4 clicks beyond 0.6mg   Walking daily  Check blood sugars on waking up ..3.. times a week Also check blood sugars about 2 hours after a meal and do this after different meals by rotation Recommended blood sugar levels on waking up is 90-130 and about 2 hours after meal is 140-180 Please bring blood sugar monitor to each visit.

## 2014-07-20 NOTE — Progress Notes (Signed)
Patient ID: Kathleen Holt, female   DOB: 1955/02/01, 59 y.o.   MRN: [id number]    Reason for Appointment: Follow-up of type 2 Diabetes  History of Present Illness:          Diagnosis: Type 2 diabetes mellitus, date of diagnosis: 2005        Past history: The diabetes was diagnosed about 10 years ago and she was probably treated with metformin and other oral agents for at least 6 years. Previously she had tried metformin, Januvia and Amaryl When she was admitted for coronary disease in 2011 in [place] she was switched to insulin and oral agents. Not clear how her control has been in the past She has been on Lantus and NovoLog insulin since 2011 She also had tried Byetta with her insulin for sometime but not clear if it was helping her  Because of her poor control and persistently high A1c of 10.1% in 04/2013 her metformin was increased to maximum dose. Also she was started on Victoza but she was unable to tolerate the 1.2 mg dosage since she had decreased appetite and abdominal distress  Recent history:   INSULIN regimen is described as:  Lantus 32 at am, NovoLog 10-12 units ac tid  Her A1c continues to be over 7%  She did not increase her Victoza beyond 0.6 mg despite instructions on the last visit, did not tolerate 1.2 mg previously Currently still on basal bolus insulin regimen with Lantus insulin in the mornings On her last visit she was told to reduce her Lantus to 30 units and change from nighttime to morning because of tendency to overnight hypoglycemic symptoms She did bring in her monitor today and is now using the Accu-Chek  Current blood sugar patterns and problems identified:  Not clear if some of her high readings in the mornings are after meals as she does not label her readings  She is usually not checking her blood sugars after meals and only a couple of readings at bedtime  She takes her Lantus about half an hour after her breakfast and appears that her  blood sugars are somewhat better earlier in the morning and higher late morning  No hypoglycemia but she had a reading of 80 at about 4 AM once with minimal symptoms  She has sporadic readings about 10-11 AM, not clear if this is after breakfast  Still does not get adequate amount of protein at every meal,  eating vegetarian meals with relatively high carbohydrate       Oral hypoglycemic drugs the patient is taking are: Metformin 2 g daily      Side effects from medications have been: abdominal distress and anorexia with 1.2 Victoza  Glucose monitoring:  done once or twice a day        Glucometer: Accu-Chek  Blood Glucose readings fromDownload  Mean values apply above for all meters except median for One Touch  PRE-MEAL  morning  Lunch Dinner Bedtime Overall  Glucose range: 96- 160     106-139    Mean/median:  139      135   POST-MEAL PC Breakfast PC Lunch PC Dinner  Glucose range:  135-181   149    Mean/median:      Glycemic control  Lab Results  Component Value Date   HGBA1C 7.7* 07/20/2014   HGBA1C 7.8* 04/13/2014   HGBA1C 7.40 01/28/2014   Lab Results  Component Value Date   MICROALBUR 2.7* 04/13/2014   [place]  36 07/20/2014   CREATININE 0.94 07/20/2014    Self-care: The diet that the patient has been following is: None, usually vegetarian      Exercise: a little walking around her apartment or going up steps         Dietician visit:  none              Compliance with the medical regimen: Fair  Weight history:  Wt Readings from Last 3 Encounters:  07/20/14 183 lb 9.6 oz (83.28 kg)  06/13/14 185 lb 6.4 oz (84.097 kg)  04/19/14 187 lb 3.2 oz (84.913 kg)     Office Visit on 07/20/2014  Component Date Value Ref Range Status  . Sodium 07/20/2014 138  135 - 145 mEq/L Final  . Potassium 07/20/2014 4.6  3.5 - 5.1 mEq/L Final  . Chloride 07/20/2014 103  96 - 112 mEq/L Final  . CO2 07/20/2014 28  19 - 32 mEq/L Final  . Glucose, Bld 07/20/2014 96  70 - 99 mg/dL  Final  . BUN 07/20/2014 11  6 - 23 mg/dL Final  . Creatinine, Ser 07/20/2014 0.94  0.40 - 1.20 mg/dL Final  . Total Bilirubin 07/20/2014 0.3  0.2 - 1.2 mg/dL Final  . Alkaline Phosphatase 07/20/2014 65  39 - 117 U/L Final  . AST 07/20/2014 19  0 - 37 U/L Final  . ALT 07/20/2014 18  0 - 35 U/L Final  . Total Protein 07/20/2014 7.7  6.0 - 8.3 g/dL Final  . Albumin 07/20/2014 4.2  3.5 - 5.2 g/dL Final  . Calcium 07/20/2014 9.5  8.4 - 10.5 mg/dL Final  . GFR 07/20/2014 64.29  >60.00 mL/min Final  . Cholesterol 07/20/2014 100  0 - 200 mg/dL Final   ATP III Classification       Desirable:  < 200 mg/dL               Borderline High:  200 - 239 mg/dL          High:  > = 240 mg/dL  . Triglycerides 07/20/2014 138.0  0.0 - 149.0 mg/dL Final   Normal:  <150 mg/dLBorderline High:  150 - 199 mg/dL  . HDL 07/20/2014 36.90* >39.00 mg/dL Final  . VLDL 07/20/2014 27.6  0.0 - 40.0 mg/dL Final  . LDL Cholesterol 07/20/2014 36  0 - 99 mg/dL Final  . Total CHOL/HDL Ratio 07/20/2014 3   Final                  Men          Women1/2 Average Risk     3.4          3.3Average Risk          5.0          4.42X Average Risk          9.6          7.13X Average Risk          15.0          11.0                      . NonHDL 07/20/2014 63.10   Final   NOTE:  Non-HDL goal should be 30 mg/dL higher than patient's LDL goal (i.e. LDL goal of < 70 mg/dL, would have non-HDL goal of < 100 mg/dL)  . TSH 07/20/2014 3.02  0.35 - 4.50 uIU/mL Final  . Hgb A1c MFr Bld  07/20/2014 7.7* 4.6 - 6.5 % Final   Glycemic Control Guidelines for People with Diabetes:Non Diabetic:  <6%Goal of Therapy: <7%Additional Action Suggested:  >8%       Medication List       This list is accurate as of: 07/20/14  2:52 PM.  Always use your most recent med list.               ACCU-CHEK AVIVA PLUS W/DEVICE Kit  Use to check blood sugar 2 times per day dx code E11.40     ACCU-CHEK SOFTCLIX LANCETS lancets  Use as instructed to check blood sugar 2  times per day dx code E11.40     aspirin 81 MG tablet  Take 4 tablets (325 mg total) by mouth daily.     atorvastatin 20 MG tablet  Commonly known as:  LIPITOR  Take 1 tablet (20 mg total) by mouth daily.     carvedilol 3.125 MG tablet  Commonly known as:  COREG  Take 1 tablet (3.125 mg total) by mouth 2 (two) times daily with a meal.     clopidogrel 75 MG tablet  Commonly known as:  PLAVIX  Take 1 tablet (75 mg total) by mouth daily.     gabapentin 400 MG capsule  Commonly known as:  NEURONTIN  Take 1 capsule (400 mg total) by mouth 3 (three) times daily. Takes 2 tablets 3 times per day     glucose blood test strip  Commonly known as:  ACCU-CHEK AVIVA PLUS  Use as instructed to check blood sugar 2 times per day dx code E11.40     insulin aspart 100 UNIT/ML FlexPen  Commonly known as:  NOVOLOG  Inject 12-16 units three times a day depending on meal     Insulin Glargine 100 UNIT/ML Solostar Pen  Commonly known as:  LANTUS  Inject 32 units daily, if am sugar is < 100 reduce to 28 units     Insulin Pen Needle 31G X 5 MM Misc  Use 5 per day to inject insulin     Liraglutide 18 MG/3ML Sopn  Inject 0.2 mLs (1.2 mg total) into the skin daily.     lisinopril 2.5 MG tablet  Commonly known as:  PRINIVIL,ZESTRIL  Take 1 tablet (2.5 mg total) by mouth daily.     metFORMIN 1000 MG tablet  Commonly known as:  GLUCOPHAGE  Take 1 tablet (1,000 mg total) by mouth 2 (two) times daily with a meal.     nitroGLYCERIN 0.4 MG SL tablet  Commonly known as:  NITROSTAT  Place 1 tablet (0.4 mg total) under the tongue every 5 (five) minutes x 3 doses as needed for chest pain.     Polyethyl Glycol-Propyl Glycol 0.4-0.3 % Soln  1 drop in each eye every 3 hours as needed.     Vitamin D (Ergocalciferol) 50000 UNITS Caps capsule  Commonly known as:  DRISDOL  Take 1 capsule (50,000 Units total) by mouth every 7 (seven) days.        Allergies: No Known Allergies  Past Medical History    Diagnosis Date  . Diabetes mellitus without complication   . Coronary artery disease     a. s/p stenting 11/2009 [facility]  . Hyperlipidemia   . Diabetic neuropathy   . Ejection fraction      EF 45-50%,  echo, February 10, 2012, akinesis posterior lateral wall, diastolic dysfunction, mild mitral regurgitation,  . Mitral regurgitation  Mild, echo, January, 2014  . Stroke     Past Surgical History  Procedure Laterality Date  . Eye surgery    . Abdominal hysterectomy      Family History  Problem Relation Age of Onset  . CAD Father     MI at age 53  . Diabetes Father   . Hypertension Father   . Cancer Father   . CAD Brother     Social History:  reports that she has never smoked. She has never used smokeless tobacco. She reports that she does not drink alcohol or use illicit drugs.    Review of Systems   Eye exam: Last in  3/16      Lipids: Has mixed dyslipidemia. On Crestor since her hospitalization for coronary artery disease in 2011       Lab Results  Component Value Date   CHOL 100 07/20/2014   HDL 36.90* 07/20/2014   LDLCALC 36 07/20/2014   TRIG 138.0 07/20/2014   CHOLHDL 3 07/20/2014        She has a long-standing history of Numbness, tingling in her feet treated adequately with gabapentin   Carpal tunnel syndrome: She periodically has numbness at night in both hands related by getting up and moving around.     Physical Examination:  BP 112/70 mmHg  Pulse 81  Temp(Src) 97.9 F (36.6 C)  Resp 16  Ht 5' (1.524 m)  Wt 183 lb 9.6 oz (83.28 kg)  BMI 35.86 kg/m2  SpO2 98%     No edema present  ASSESSMENT:  Diabetes type 2, uncontrolled with obesity  See history of present illness for detailed discussion of his current management, blood sugar patterns and problems identified  Although she had benefited from adding Victoza to her basal bolus insulin regimen she has only taken 0.6 mg despite instructions to increase it  to at least halfway up to 1.2 Most likely she has postprandial hyperglycemia since her A1c tends to be persistently high and she has a relatively high carbohydrate diet Her basal insulin is adequately regulated as fasting readings early in the mornings are fairly good with no overnight hypoglycemia; however she may have a Dawn phenomenon with her blood sugars somewhat higher late morning Again difficulty with getting her weight down and increasing her activity level adequately for exercise   PLAN:  She will need to start checking blood sugars after meals more consistently, discussed timing of monitoring and targets Again  try increasing her Victoza by at least 2 clicks now and another 2-3 clicks more if tolerated Continue same insulin doses but reduce Lantus to 30 units and take it early in the morning No change in NovoLog unless postprandial readings tend to be high  Smaller amounts of carbohydrate and more protein at meals  More consistent walking for exercise  Patient Instructions  Lantus 30 units  on walking up  Victoza 2-4 clicks beyond 8.0XK  Walking daily  Check blood sugars on waking up ..3.. times a week Also check blood sugars about 2 hours after a meal and do this after different meals by rotation Recommended blood sugar levels on waking up is 90-130 and about 2 hours after meal is 140-180 Please bring blood sugar monitor to each visit.    Counseling time on subjects discussed above is over 50% of today's 25 minute visit  Vaneza Pickart 07/20/2014, 2:52 PM

## 2014-07-22 ENCOUNTER — Other Ambulatory Visit: Payer: Self-pay | Admitting: Endocrinology

## 2014-08-25 ENCOUNTER — Other Ambulatory Visit: Payer: Self-pay | Admitting: Endocrinology

## 2014-09-04 ENCOUNTER — Other Ambulatory Visit: Payer: Self-pay | Admitting: Internal Medicine

## 2014-09-14 NOTE — Telephone Encounter (Signed)
Gabapentin Refill send to [facility]  Pt notified, date of birth verified Pt advised to schedule appointment to establish care with new PCP

## 2014-10-05 ENCOUNTER — Other Ambulatory Visit: Payer: Self-pay | Admitting: Endocrinology

## 2014-10-06 ENCOUNTER — Other Ambulatory Visit: Payer: Self-pay | Admitting: *Deleted

## 2014-10-06 MED ORDER — LIRAGLUTIDE 18 MG/3ML ~~LOC~~ SOPN: 1.2000 mg | PEN_INJECTOR | Freq: Every day | SUBCUTANEOUS | Status: DC

## 2014-10-11 ENCOUNTER — Encounter: Payer: Self-pay | Admitting: Family Medicine

## 2014-10-11 ENCOUNTER — Ambulatory Visit: Payer: Medicaid Other | Attending: Family Medicine | Admitting: Family Medicine

## 2014-10-11 VITALS — BP 122/80 | HR 82 | Temp 97.9°F | Resp 16 | Ht 60.0 in | Wt 187.0 lb

## 2014-10-11 DIAGNOSIS — D649 Anemia, unspecified: Secondary | ICD-10-CM | POA: Insufficient documentation

## 2014-10-11 DIAGNOSIS — E1140 Type 2 diabetes mellitus with diabetic neuropathy, unspecified: Secondary | ICD-10-CM | POA: Diagnosis not present

## 2014-10-11 DIAGNOSIS — Z9841 Cataract extraction status, right eye: Secondary | ICD-10-CM

## 2014-10-11 DIAGNOSIS — Z794 Long term (current) use of insulin: Secondary | ICD-10-CM | POA: Insufficient documentation

## 2014-10-11 DIAGNOSIS — Z0000 Encounter for general adult medical examination without abnormal findings: Secondary | ICD-10-CM

## 2014-10-11 DIAGNOSIS — E1149 Type 2 diabetes mellitus with other diabetic neurological complication: Secondary | ICD-10-CM

## 2014-10-11 DIAGNOSIS — I25119 Atherosclerotic heart disease of native coronary artery with unspecified angina pectoris: Secondary | ICD-10-CM | POA: Insufficient documentation

## 2014-10-11 DIAGNOSIS — E139 Other specified diabetes mellitus without complications: Secondary | ICD-10-CM

## 2014-10-11 DIAGNOSIS — E559 Vitamin D deficiency, unspecified: Secondary | ICD-10-CM

## 2014-10-11 DIAGNOSIS — Z23 Encounter for immunization: Secondary | ICD-10-CM | POA: Diagnosis not present

## 2014-10-11 DIAGNOSIS — I10 Essential (primary) hypertension: Secondary | ICD-10-CM | POA: Insufficient documentation

## 2014-10-11 DIAGNOSIS — Z9849 Cataract extraction status, unspecified eye: Secondary | ICD-10-CM | POA: Insufficient documentation

## 2014-10-11 LAB — CBC
HEMATOCRIT: 32.7 % — AB (ref 36.0–46.0)
HEMOGLOBIN: 9.9 g/dL — AB (ref 12.0–15.0)
MCH: 20 pg — AB (ref 26.0–34.0)
MCHC: 30.3 g/dL (ref 30.0–36.0)
MCV: 65.9 fL — AB (ref 78.0–100.0)
MPV: 9 fL (ref 8.6–12.4)
PLATELETS: 419 10*3/uL — AB (ref 150–400)
RBC: 4.96 MIL/uL (ref 3.87–5.11)
RDW: 18.3 % — ABNORMAL HIGH (ref 11.5–15.5)
WBC: 9.5 10*3/uL (ref 4.0–10.5)

## 2014-10-11 LAB — GLUCOSE, POCT (MANUAL RESULT ENTRY): POC Glucose: 164 mg/dl — AB (ref 70–99)

## 2014-10-11 LAB — POCT GLYCOSYLATED HEMOGLOBIN (HGB A1C): Hemoglobin A1C: 7.6

## 2014-10-11 MED ORDER — GABAPENTIN 400 MG PO CAPS: 800.0000 mg | ORAL_CAPSULE | Freq: Three times a day (TID) | ORAL | Status: DC

## 2014-10-11 MED ORDER — CLOPIDOGREL BISULFATE 75 MG PO TABS: 75.0000 mg | ORAL_TABLET | Freq: Every day | ORAL | Status: DC

## 2014-10-11 MED ORDER — CARVEDILOL 3.125 MG PO TABS: 3.1250 mg | ORAL_TABLET | Freq: Two times a day (BID) | ORAL | Status: DC

## 2014-10-11 MED ORDER — LISINOPRIL 2.5 MG PO TABS: 2.5000 mg | ORAL_TABLET | Freq: Every day | ORAL | Status: DC

## 2014-10-11 MED ORDER — INSULIN ASPART 100 UNIT/ML FLEXPEN: 14.0000 [IU] | PEN_INJECTOR | Freq: Three times a day (TID) | SUBCUTANEOUS | Status: DC

## 2014-10-11 MED ORDER — ACCU-CHEK SOFTCLIX LANCETS MISC: Status: DC

## 2014-10-11 MED ORDER — GLUCOSE BLOOD VI STRP: ORAL_STRIP | Status: DC

## 2014-10-11 MED ORDER — ASPIRIN 81 MG PO TABS: 81.0000 mg | ORAL_TABLET | Freq: Every day | ORAL | Status: DC

## 2014-10-11 MED ORDER — METFORMIN HCL 1000 MG PO TABS: 1000.0000 mg | ORAL_TABLET | Freq: Two times a day (BID) | ORAL | Status: DC

## 2014-10-11 MED ORDER — INSULIN GLARGINE 100 UNIT/ML SOLOSTAR PEN: 30.0000 [IU] | PEN_INJECTOR | Freq: Every day | SUBCUTANEOUS | Status: DC

## 2014-10-11 MED ORDER — ATORVASTATIN CALCIUM 20 MG PO TABS: 20.0000 mg | ORAL_TABLET | Freq: Every day | ORAL | Status: DC

## 2014-10-11 MED ORDER — LIRAGLUTIDE 18 MG/3ML ~~LOC~~ SOPN: 1.2000 mg | PEN_INJECTOR | Freq: Every day | SUBCUTANEOUS | Status: DC

## 2014-10-11 NOTE — Progress Notes (Signed)
Used [facility]ific Interpreted hindi # [id number] Establish Care with PCP F/U DM

## 2014-10-11 NOTE — Patient Instructions (Addendum)
Kathleen Holt,  Thank you for coming in today. It was a pleasure meeting you. I look forward to being your primary doctor.  Kathleen Holt was seen today for diabetes.  Diagnoses and all orders for this visit:  DM (diabetes mellitus), type 2 with neurological complications -     POCT glycosylated hemoglobin (Hb A1C) -     POCT glucose (manual entry) -     ACCU-CHEK SOFTCLIX LANCETS lancets; Use as instructed to check blood sugar 2 times per day dx code E11.40 -     atorvastatin (LIPITOR) 20 MG tablet; Take 1 tablet (20 mg total) by mouth daily. -     glucose blood (ACCU-CHEK AVIVA PLUS) test strip; Use as instructed to check blood sugar 2 times per day dx code E11.40 -     Insulin Glargine (LANTUS SOLOSTAR) 100 UNIT/ML Solostar Pen; Inject 30 Units into the skin daily at 10 pm. -     Liraglutide 18 MG/3ML SOPN; Inject 0.2 mLs (1.2 mg total) into the skin daily. -     metFORMIN (GLUCOPHAGE) 1000 MG tablet; Take 1 tablet (1,000 mg total) by mouth 2 (two) times daily with a meal. -     insulin aspart (NOVOLOG FLEXPEN) 100 UNIT/ML FlexPen; Inject 14 Units into the skin 3 (three) times daily with meals. -     gabapentin (NEURONTIN) 400 MG capsule; Take 2 capsules (800 mg total) by mouth 3 (three) times daily.  Encounter for immunization  S/P cataract extraction, right  Anemia, unspecified anemia type -     CBC  Vitamin D insufficiency -     Vitamin D, 25-hydroxy  Health care maintenance  Coronary artery disease with unspecified angina pectoris -     aspirin 81 MG tablet; Take 1 tablet (81 mg total) by mouth daily. -     atorvastatin (LIPITOR) 20 MG tablet; Take 1 tablet (20 mg total) by mouth daily. -     carvedilol (COREG) 3.125 MG tablet; Take 1 tablet (3.125 mg total) by mouth 2 (two) times daily with a meal. -     clopidogrel (PLAVIX) 75 MG tablet; Take 1 tablet (75 mg total) by mouth daily.  Essential hypertension, benign -     lisinopril (PRINIVIL,ZESTRIL) 2.5 MG tablet; Take 1  tablet (2.5 mg total) by mouth daily.  Other specified diabetes mellitus without complications  Other orders -     Flu Vaccine QUAD 36+ mos IM   F/u in 3 months for diabetes Dr. Adrian Blackwater

## 2014-10-11 NOTE — Progress Notes (Signed)
Patient ID: Kathleen Holt, female   DOB: [birth year], 59 y.o.   MRN: [id number]   Subjective:  Patient ID: Kathleen Holt, female    DOB: 15-Nov-1955  Age: 59 y.o. MRN: [id number]  CC: Diabetes  HPI Monica Codd presents for diabetes and wellness  1. CHRONIC DIABETES  Disease Monitoring  Blood Sugar Ranges: 90-160  Polyuria: no   Visual problems: yes   Medication Compliance: yes  Medication Side Effects  Hypoglycemia: no   Preventitive Health Care  Eye Exam: due   Foot Exam: done recently   Diet pattern: veggies, 3 regular meals  Exercise: no   Social History  Substance Use Topics  . Smoking status: Never Smoker   . Smokeless tobacco: Never Used  . Alcohol Use: No      Outpatient Prescriptions Prior to Visit  Medication Sig Dispense Refill  . ACCU-CHEK SOFTCLIX LANCETS lancets Use as instructed to check blood sugar 2 times per day dx code E11.40 100 each 3  . aspirin 81 MG tablet Take 4 tablets (325 mg total) by mouth daily.    Marland Kitchen atorvastatin (LIPITOR) 20 MG tablet Take 1 tablet (20 mg total) by mouth daily. 90 tablet 3  . Blood Glucose Monitoring Suppl (ACCU-CHEK AVIVA PLUS) W/DEVICE KIT Use to check blood sugar 2 times per day dx code E11.40 1 kit 1  . carvedilol (COREG) 3.125 MG tablet Take 1 tablet (3.125 mg total) by mouth 2 (two) times daily with a meal. 90 tablet 3  . clopidogrel (PLAVIX) 75 MG tablet Take 1 tablet (75 mg total) by mouth daily. 90 tablet 3  . gabapentin (NEURONTIN) 400 MG capsule TAKE 2 CAPSULES BY MOUTH 3 TIMES A DAY 240 capsule 0  . glucose blood (ACCU-CHEK AVIVA PLUS) test strip Use as instructed to check blood sugar 2 times per day dx code E11.40 100 each 3  . Insulin Pen Needle 31G X 5 MM MISC Use 5 per day to inject insulin 150 each 2  . LANTUS SOLOSTAR 100 UNIT/ML Solostar Pen INJECT 32 UNITS DAILY, IF MORNING SUGAR IS LESS THAN 100 REDUCE TO 28 UNITS 45 mL 1  . Liraglutide 18 MG/3ML SOPN Inject 0.2 mLs (1.2 mg total) into the skin daily.  2 pen 3  . lisinopril (PRINIVIL,ZESTRIL) 2.5 MG tablet Take 1 tablet (2.5 mg total) by mouth daily. 90 tablet 3  . metFORMIN (GLUCOPHAGE) 1000 MG tablet Take 1 tablet (1,000 mg total) by mouth 2 (two) times daily with a meal. 60 tablet 3  . metFORMIN (GLUCOPHAGE) 1000 MG tablet TAKE 1 TABLET (1,000 MG TOTAL) BY MOUTH 2 (TWO) TIMES DAILY WITH A MEAL. 60 tablet 2  . nitroGLYCERIN (NITROSTAT) 0.4 MG SL tablet Place 1 tablet (0.4 mg total) under the tongue every 5 (five) minutes x 3 doses as needed for chest pain. 90 tablet 3  . NOVOLOG FLEXPEN 100 UNIT/ML FlexPen INJECT 12-16 UNITS THREE TIMES A DAY DEPENDING ON MEAL 15 pen 2  . Polyethyl Glycol-Propyl Glycol 0.4-0.3 % SOLN 1 drop in each eye every 3 hours as needed. 10 mL 12  . Vitamin D, Ergocalciferol, (DRISDOL) 50000 UNITS CAPS capsule Take 1 capsule (50,000 Units total) by mouth every 7 (seven) days. 12 capsule 0   No facility-administered medications prior to visit.    ROS Review of Systems  Constitutional: Negative for fever and chills.  Eyes: Positive for visual disturbance.  Respiratory: Negative for shortness of breath.   Cardiovascular: Negative for chest pain.  Gastrointestinal: Negative for abdominal  pain and blood in stool.  Musculoskeletal: Negative for back pain and arthralgias.  Skin: Negative for rash.  Allergic/Immunologic: Negative for immunocompromised state.  Hematological: Negative for adenopathy. Does not bruise/bleed easily.  Psychiatric/Behavioral: Positive for sleep disturbance. Negative for suicidal ideas and dysphoric mood.    Objective:  BP 122/80 mmHg  Pulse 82  Temp(Src) 97.9 F (36.6 C) (Oral)  Resp 16  Ht 5' (1.524 m)  Wt 187 lb (84.823 kg)  BMI 36.52 kg/m2  SpO2 97%  BP/Weight 10/11/2014 07/20/2014 4/46/2863  Systolic BP 817 711 657  Diastolic BP 80 70 83  Wt. (Lbs) 187 183.6 185.4  BMI 36.52 35.86 36.21   Physical Exam  Constitutional: She is oriented to person, place, and time. She appears  well-developed and well-nourished. No distress.  HENT:  Head: Normocephalic and atraumatic.  Eyes: Conjunctivae and EOM are normal. Pupils are equal, round, and reactive to light.  Neck: Normal range of motion.  Cardiovascular: Normal rate, regular rhythm, normal heart sounds and intact distal pulses.   Pulmonary/Chest: Effort normal and breath sounds normal.  Abdominal:  Obese  Non tender   Musculoskeletal: She exhibits no edema.  Neurological: She is alert and oriented to person, place, and time.  Skin: Skin is warm and dry. No rash noted.  Psychiatric: She has a normal mood and affect.    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)   Relevant Medications   ACCU-CHEK SOFTCLIX LANCETS lancets   aspirin 81 MG tablet   atorvastatin (LIPITOR) 20 MG tablet   glucose blood (ACCU-CHEK AVIVA PLUS) test strip   Insulin Glargine (LANTUS SOLOSTAR) 100 UNIT/ML Solostar Pen   Liraglutide 18 MG/3ML SOPN   lisinopril (PRINIVIL,ZESTRIL) 2.5 MG tablet   metFORMIN (GLUCOPHAGE) 1000 MG tablet   insulin aspart (NOVOLOG FLEXPEN) 100 UNIT/ML FlexPen   gabapentin (NEURONTIN) 400 MG capsule   Other Relevant Orders   POCT glycosylated hemoglobin (Hb A1C) (Completed)   POCT glucose (manual entry) (Completed)   Ambulatory referral to Ophthalmology   Essential hypertension, benign (Chronic)   Relevant Medications   aspirin 81 MG tablet   atorvastatin (LIPITOR) 20 MG tablet   carvedilol (COREG) 3.125 MG tablet   lisinopril (PRINIVIL,ZESTRIL) 2.5 MG  tablet   S/P cataract extraction (Chronic)   Vitamin D insufficiency   Relevant Orders   Vitamin D, 25-hydroxy    Other Visit Diagnoses    Encounter for immunization        Health care maintenance        Other specified diabetes mellitus without complications        Relevant Medications    aspirin 81 MG tablet    atorvastatin (LIPITOR) 20 MG tablet    Insulin Glargine (LANTUS SOLOSTAR) 100 UNIT/ML Solostar Pen    Liraglutide 18 MG/3ML SOPN    lisinopril (PRINIVIL,ZESTRIL) 2.5 MG tablet    metFORMIN (GLUCOPHAGE) 1000 MG tablet    insulin aspart (NOVOLOG FLEXPEN) 100 UNIT/ML FlexPen       No  orders of the defined types were placed in this encounter.    Follow-up: No Follow-up on file.   Boykin Nearing MD

## 2014-10-12 ENCOUNTER — Telehealth: Payer: Self-pay | Admitting: *Deleted

## 2014-10-12 LAB — VITAMIN D 25 HYDROXY (VIT D DEFICIENCY, FRACTURES): Vit D, 25-Hydroxy: 19 ng/mL — ABNORMAL LOW (ref 30–100)

## 2014-10-12 MED ORDER — ACCU-CHEK SOFTCLIX LANCETS MISC: Status: DC

## 2014-10-12 MED ORDER — FERROUS SULFATE 325 (65 FE) MG PO TABS: 325.0000 mg | ORAL_TABLET | Freq: Two times a day (BID) | ORAL | Status: DC

## 2014-10-12 MED ORDER — VITAMIN D (ERGOCALCIFEROL) 1.25 MG (50000 UNIT) PO CAPS
50000.0000 [IU] | ORAL_CAPSULE | ORAL | Status: DC
Start: 2014-10-12 — End: 2015-03-21

## 2014-10-12 MED ORDER — GLUCOSE BLOOD VI STRP: ORAL_STRIP | Status: DC

## 2014-10-12 NOTE — Addendum Note (Signed)
Addended by: Boykin Nearing on: 10/12/2014 02:36 PM   Modules accepted: Orders

## 2014-10-12 NOTE — Telephone Encounter (Signed)
Used pacific interpreter Hindi [phone] Lab results given to pt  Notified Rx at pharmacy  Pt verbalized understanding

## 2014-10-12 NOTE — Telephone Encounter (Signed)
-----   Message from Boykin Nearing, MD sent at 10/12/2014  2:31 PM EDT ----- Vit D def treating Microcytic anemia suspect iron deficiency oral iron and GI referral for colonoscopy ordered

## 2014-10-17 ENCOUNTER — Other Ambulatory Visit (INDEPENDENT_AMBULATORY_CARE_PROVIDER_SITE_OTHER): Payer: Medicaid Other

## 2014-10-17 DIAGNOSIS — E1140 Type 2 diabetes mellitus with diabetic neuropathy, unspecified: Secondary | ICD-10-CM | POA: Diagnosis not present

## 2014-10-17 DIAGNOSIS — E1149 Type 2 diabetes mellitus with other diabetic neurological complication: Secondary | ICD-10-CM

## 2014-10-17 LAB — BASIC METABOLIC PANEL
BUN: 15 mg/dL (ref 6–23)
CHLORIDE: 103 meq/L (ref 96–112)
CO2: 27 meq/L (ref 19–32)
CREATININE: 0.93 mg/dL (ref 0.40–1.20)
Calcium: 9 mg/dL (ref 8.4–10.5)
GFR: 65.04 mL/min (ref >60.00)
Glucose, Bld: 100 mg/dL — ABNORMAL HIGH (ref 70–99)
POTASSIUM: 4.4 meq/L (ref 3.5–5.1)
Sodium: 138 mEq/L (ref 135–145)

## 2014-10-17 LAB — HEMOGLOBIN A1C: HEMOGLOBIN A1C: 7.8 % — AB (ref 4.6–6.5)

## 2014-10-20 ENCOUNTER — Encounter: Payer: Self-pay | Admitting: Endocrinology

## 2014-10-20 ENCOUNTER — Ambulatory Visit (INDEPENDENT_AMBULATORY_CARE_PROVIDER_SITE_OTHER): Payer: Medicaid Other | Admitting: Endocrinology

## 2014-10-20 VITALS — BP 140/74 | HR 80 | Temp 98.3°F | Resp 16 | Ht 60.0 in | Wt 185.6 lb

## 2014-10-20 DIAGNOSIS — E785 Hyperlipidemia, unspecified: Secondary | ICD-10-CM | POA: Diagnosis not present

## 2014-10-20 DIAGNOSIS — E1140 Type 2 diabetes mellitus with diabetic neuropathy, unspecified: Secondary | ICD-10-CM

## 2014-10-20 DIAGNOSIS — E1165 Type 2 diabetes mellitus with hyperglycemia: Secondary | ICD-10-CM | POA: Diagnosis not present

## 2014-10-20 DIAGNOSIS — E1149 Type 2 diabetes mellitus with other diabetic neurological complication: Secondary | ICD-10-CM

## 2014-10-20 DIAGNOSIS — IMO0002 Reserved for concepts with insufficient information to code with codable children: Secondary | ICD-10-CM

## 2014-10-20 NOTE — Progress Notes (Signed)
Patient ID: Kathleen Holt, female   DOB: 19-Feb-1955, 59 y.o.   MRN: [id number]    Reason for Appointment: Follow-up of type 2 Diabetes  History of Present Illness:          Diagnosis: Type 2 diabetes mellitus, date of diagnosis: 2005        Past history: The diabetes was diagnosed about 10 years ago and she was probably treated with metformin and other oral agents for at least 6 years. Previously she had tried metformin, Januvia and Amaryl When she was admitted for coronary disease in 2011 in [place] she was switched to insulin and oral agents. Not clear how her control has been in the past She has been on Lantus and NovoLog insulin since 2011 She also had tried [place] with her insulin for sometime but not clear if it was helping her  Because of her poor control and persistently high A1c of 10.1% in 04/2013 her metformin was increased to maximum dose. Also she was started on Victoza but she was unable to tolerate the 1.2 mg dosage since she had decreased appetite and abdominal distress  Recent history:   INSULIN regimen is described as:  Lantus 30 at am, NovoLog 10-14 units ac tid  Her A1c continues to be over 7%  She did  increase her Victoza 2 clicks beyond 0.6 mg but not any further She did not tolerate 1.2 mg previously Currently  on basal bolus insulin regimen with Lantus insulin in the mornings On her last visit she was told to reduce her Lantus to 30 units and change from nighttime to morning With this she has not had any overnight hypoglycemia  Current blood sugar patterns and problems identified:  She is checking blood sugars primarily at lunchtime only  Although she has had occasional high readings around lunchtime they are fairly consistently near normal recently and averaging about 125  Fasting glucose in the lab was normal at 100  She cannot explain a high reading of 258 during the night  She has difficulty losing weight even with Victoza  She is  empirically adjusting her mealtime doses between 8-14 units but not clear if this is working as she is not doing any postprandial readings       Oral hypoglycemic drugs the patient is taking are: Metformin 2 g daily      Side effects from medications have been: abdominal distress and anorexia with 1.2 Victoza  Glucose monitoring:  done once daily        Glucometer: Accu-Chek  Blood Glucose readings from Download show recent blood sugar average of 130 Checking blood sugars only before lunch with recent range 95-148 Has one reading of 258 during the night  Glycemic control  Lab Results  Component Value Date   HGBA1C 7.8* 10/17/2014   HGBA1C 7.60 10/11/2014   HGBA1C 7.7* 07/20/2014   Lab Results  Component Value Date   MICROALBUR 2.7* 04/13/2014   LDLCALC 36 07/20/2014   CREATININE 0.93 10/17/2014    Self-care: The diet that the patient has been following is: None, usually vegetarian      Exercise: a little walking around her apartment or going up steps         Dietician visit:  none              Compliance with the medical regimen: Fair  Weight history:  Wt Readings from Last 3 Encounters:  10/20/14 185 lb 9.6 oz (84.188 kg)  10/11/14 187  lb (84.823 kg)  07/20/14 183 lb 9.6 oz (83.28 kg)     Appointment on 10/17/2014  Component Date Value Ref Range Status  . Hgb A1c MFr Bld 10/17/2014 7.8* 4.6 - 6.5 % Final   Glycemic Control Guidelines for People with Diabetes:Non Diabetic:  <6%Goal of Therapy: <7%Additional Action Suggested:  >8%   . Sodium 10/17/2014 138  135 - 145 mEq/L Final  . Potassium 10/17/2014 4.4  3.5 - 5.1 mEq/L Final  . Chloride 10/17/2014 103  96 - 112 mEq/L Final  . CO2 10/17/2014 27  19 - 32 mEq/L Final  . Glucose, Bld 10/17/2014 100* 70 - 99 mg/dL Final  . BUN 10/17/2014 15  6 - 23 mg/dL Final  . Creatinine, Ser 10/17/2014 0.93  0.40 - 1.20 mg/dL Final  . Calcium 10/17/2014 9.0  8.4 - 10.5 mg/dL Final  . GFR 10/17/2014 65.04  >60.00 mL/min Final       Medication List       This list is accurate as of: 10/20/14 10:27 AM.  Always use your most recent med list.               ACCU-CHEK AVIVA PLUS W/DEVICE Kit  Use to check blood sugar 2 times per day dx code E11.40     ACCU-CHEK SOFTCLIX LANCETS lancets  Use as instructed to check blood sugar 2 times per day dx code E11.40     aspirin 81 MG tablet  Take 1 tablet (81 mg total) by mouth daily.     atorvastatin 20 MG tablet  Commonly known as:  LIPITOR  Take 1 tablet (20 mg total) by mouth daily.     carvedilol 3.125 MG tablet  Commonly known as:  COREG  Take 1 tablet (3.125 mg total) by mouth 2 (two) times daily with a meal.     clopidogrel 75 MG tablet  Commonly known as:  PLAVIX  Take 1 tablet (75 mg total) by mouth daily.     ferrous sulfate 325 (65 FE) MG tablet  Take 1 tablet (325 mg total) by mouth 2 (two) times daily with a meal.     gabapentin 400 MG capsule  Commonly known as:  NEURONTIN  Take 2 capsules (800 mg total) by mouth 3 (three) times daily.     glucose blood test strip  Commonly known as:  ACCU-CHEK AVIVA PLUS  Use as instructed to check blood sugar 2 times per day dx code E11.40     insulin aspart 100 UNIT/ML FlexPen  Commonly known as:  NOVOLOG FLEXPEN  Inject 14 Units into the skin 3 (three) times daily with meals.     Insulin Glargine 100 UNIT/ML Solostar Pen  Commonly known as:  LANTUS SOLOSTAR  Inject 30 Units into the skin daily at 10 pm.     Insulin Pen Needle 31G X 5 MM Misc  Use 5 per day to inject insulin     Liraglutide 18 MG/3ML Sopn  Inject 0.2 mLs (1.2 mg total) into the skin daily.     lisinopril 2.5 MG tablet  Commonly known as:  PRINIVIL,ZESTRIL  Take 1 tablet (2.5 mg total) by mouth daily.     metFORMIN 1000 MG tablet  Commonly known as:  GLUCOPHAGE  Take 1 tablet (1,000 mg total) by mouth 2 (two) times daily with a meal.     nitroGLYCERIN 0.4 MG SL tablet  Commonly known as:  NITROSTAT  Place 1 tablet (0.4 mg  total) under the tongue every  5 (five) minutes x 3 doses as needed for chest pain.     Polyethyl Glycol-Propyl Glycol 0.4-0.3 % Soln  1 drop in each eye every 3 hours as needed.     Vitamin D (Ergocalciferol) 50000 UNITS Caps capsule  Commonly known as:  DRISDOL  Take 1 capsule (50,000 Units total) by mouth every 7 (seven) days. For 8 weeks        Allergies: No Known Allergies  Past Medical History  Diagnosis Date  . Diabetes mellitus without complication   . Coronary artery disease     a. s/p stenting 11/2009 [facility]  . Hyperlipidemia   . Diabetic neuropathy   . Ejection fraction      EF 45-50%,  echo, February 10, 2012, akinesis posterior lateral wall, diastolic dysfunction, mild mitral regurgitation,  . Mitral regurgitation     Mild, echo, January, 2014  . Stroke     Past Surgical History  Procedure Laterality Date  . Eye surgery    . Abdominal hysterectomy      Family History  Problem Relation Age of Onset  . CAD Father     MI at age 39  . Diabetes Father   . Hypertension Father   . Cancer Father   . CAD Brother     Social History:  reports that she has never smoked. She has never used smokeless tobacco. She reports that she does not drink alcohol or use illicit drugs.    Review of Systems   Eye exam: Last in  3/16  She apparently had a stress test in 2014 but asking about getting an EKG      Lipids: Has mixed dyslipidemia. On Crestor since her hospitalization for coronary artery disease in 2011       Lab Results  Component Value Date   CHOL 100 07/20/2014   HDL 36.90* 07/20/2014   LDLCALC 36 07/20/2014   TRIG 138.0 07/20/2014   CHOLHDL 3 07/20/2014        She has a long-standing history of Numbness, tingling in her feet treated adequately with gabapentin   She was recently found to have vitamin D deficiency again and has been prescribed 50,000 units, discussed needing to take a supplement long-term  Physical  Examination:  BP 140/74 mmHg  Pulse 80  Temp(Src) 98.3 F (36.8 C)  Resp 16  Ht 5' (1.524 m)  Wt 185 lb 9.6 oz (84.188 kg)  BMI 36.25 kg/m2  SpO2 97%     No edema present  ASSESSMENT:  Diabetes type 2, uncontrolled with marked obesity  See history of present illness for detailed discussion of his current management, blood sugar patterns and problems identified  Although she had benefited from adding Victoza to her basal bolus insulin regimen her A1c is still over 7.5 She cannot tolerate the 1.2 mg dose Most likely she has postprandial hyperglycemia since her A1c tends to be persistently high Although she is trying to adjust her mealtime dose based on her meal size and carbohydrate intake not clear if this is adequate since she does not do any postprandial readings Her basal insulin is  probably adequate as lab fasting glucose was 100 and she has not had any overnight hypoglycemia with doing the injection in the morning Again difficult to assess consistency of her fasting readings which she does not monitor  Again difficulty with getting her weight down as she cannot exercise much  NEUROPATHY: Appears adequately controlled with gabapentin   PLAN:  She will need to start checking blood sugars at various times including after meals more consistently, discussed timing of monitoring and targets.  She does not need to do it before meals Again  try increasing her Victoza by at least 2 clicks every week and if possible move up to 1.2 mg over time Continue same insulin doses but adjust mealtime dose further if she is having high postprandial readings  Smaller amounts of carbohydrate and more protein at meals  More consistent walking for exercise  She will call her cardiologist for follow-up  No change in Crestor  Patient Instructions  Check blood sugars on waking up .Marland Kitchen2-3  .Marland Kitchen times a week Also check blood sugars about 2 hours after a meal and do this after different meals by  rotation  Recommended blood sugar levels on waking up is 90-130 and about 2 hours after meal is 140-160 Please bring blood sugar monitor to each visit.  Victoza 4-6 clicks beyond 2.4EL    Counseling time on subjects discussed above is over 50% of today's 25 minute visit  KUMAR,AJAY 10/20/2014, 10:27 AM

## 2014-10-20 NOTE — Patient Instructions (Addendum)
Check blood sugars on waking up .Marland Kitchen2-3  .Marland Kitchen times a week Also check blood sugars about 2 hours after a meal and do this after different meals by rotation  Recommended blood sugar levels on waking up is 90-130 and about 2 hours after meal is 140-160 Please bring blood sugar monitor to each visit.  Victoza 4-6 clicks beyond 0.6mg 

## 2014-12-01 ENCOUNTER — Other Ambulatory Visit: Payer: Self-pay | Admitting: *Deleted

## 2014-12-01 ENCOUNTER — Encounter: Payer: Self-pay | Admitting: Cardiology

## 2014-12-01 ENCOUNTER — Ambulatory Visit (INDEPENDENT_AMBULATORY_CARE_PROVIDER_SITE_OTHER): Payer: Medicaid Other | Admitting: Cardiology

## 2014-12-01 VITALS — BP 120/66 | HR 82 | Ht 60.0 in | Wt 193.2 lb

## 2014-12-01 DIAGNOSIS — I25119 Atherosclerotic heart disease of native coronary artery with unspecified angina pectoris: Secondary | ICD-10-CM

## 2014-12-01 DIAGNOSIS — I10 Essential (primary) hypertension: Secondary | ICD-10-CM

## 2014-12-01 DIAGNOSIS — R079 Chest pain, unspecified: Secondary | ICD-10-CM

## 2014-12-01 DIAGNOSIS — E785 Hyperlipidemia, unspecified: Secondary | ICD-10-CM

## 2014-12-01 DIAGNOSIS — I2510 Atherosclerotic heart disease of native coronary artery without angina pectoris: Secondary | ICD-10-CM

## 2014-12-01 DIAGNOSIS — I2583 Coronary atherosclerosis due to lipid rich plaque: Principal | ICD-10-CM

## 2014-12-01 MED ORDER — NITROGLYCERIN 0.4 MG SL SUBL: 0.4000 mg | SUBLINGUAL_TABLET | SUBLINGUAL | Status: DC | PRN

## 2014-12-01 NOTE — Progress Notes (Signed)
Cardiology Office Note   Date:  12/01/2014   ID:  Kathleen Holt, DOB 18-Oct-1955, MRN [id number]  PCP:  Lance Bosch, NP    Chief Complaint  Patient presents with  . Annual Exam  . Coronary Artery Disease      History of Present Illness: Kathleen Holt is a 59 y.o. female who presents for followup of CAD. She had 3 drug-eluting stents placed in [place] in 2011. Her stent cards say that one is in the right coronary artery but does not designate the site of the other 2. Her last echocardiogram showed posterior lateral akinesia with mild mitral regurgitation and ejection fraction of 45-50%.  She has had diabetes for 14 years.  She takes her meds according to her husband but does not follow her diet. She is overweight. She already has other complications of her diabetes including neuropathy.  She has been having some chest discomfort with walking that goes away with SL NTG.  She does not have any SOB.  . She denies any  dizziness, LE edema or syncope.  She's had no claudications. Meds reviewed and she is on a good program.     Past Medical History  Diagnosis Date  . Diabetes mellitus without complication ([place])   . Coronary artery disease     a. s/p stenting 11/2009 [facility]  . Hyperlipidemia   . Diabetic neuropathy ([place])   . Ejection fraction      EF 45-50%,  echo, February 10, 2012, akinesis posterior lateral wall, diastolic dysfunction, mild mitral regurgitation,  . Mitral regurgitation     Mild, echo, January, 2014  . Stroke [facility])     Past Surgical History  Procedure Laterality Date  . Eye surgery    . Abdominal hysterectomy       Current Outpatient Prescriptions  Medication Sig Dispense Refill  . ACCU-CHEK SOFTCLIX LANCETS lancets Use as instructed to check blood sugar 2 times per day dx code E11.40 100 each 3  . aspirin 81 MG tablet Take 1 tablet (81 mg total) by mouth daily. 30 tablet 5  . atorvastatin  (LIPITOR) 20 MG tablet Take 1 tablet (20 mg total) by mouth daily. 30 tablet 5  . Blood Glucose Monitoring Suppl (ACCU-CHEK AVIVA PLUS) W/DEVICE KIT Use to check blood sugar 2 times per day dx code E11.40 1 kit 1  . carvedilol (COREG) 3.125 MG tablet Take 1 tablet (3.125 mg total) by mouth 2 (two) times daily with a meal. 60 tablet 5  . clopidogrel (PLAVIX) 75 MG tablet Take 1 tablet (75 mg total) by mouth daily. 90 tablet 3  . ferrous sulfate 325 (65 FE) MG tablet Take 1 tablet (325 mg total) by mouth 2 (two) times daily with a meal. 60 tablet 3  . gabapentin (NEURONTIN) 400 MG capsule Take 2 capsules (800 mg total) by mouth 3 (three) times daily. 240 capsule 5  . glucose blood (ACCU-CHEK AVIVA PLUS) test strip Use as instructed to check blood sugar 2 times per day dx code E11.40 100 each 3  . insulin aspart (NOVOLOG FLEXPEN) 100 UNIT/ML FlexPen Inject 14 Units into the skin 3 (three) times daily with meals. (Patient taking differently: Inject 10-14 Units into the skin 3 (three) times daily with meals. ) 15 pen 2  . Insulin Glargine (LANTUS SOLOSTAR) 100 UNIT/ML Solostar Pen Inject 30 Units into the skin daily at  10 pm. 45 mL 1  . Insulin Pen Needle 31G X 5 MM MISC Use 5 per day to inject insulin 150 each 2  . Liraglutide 18 MG/3ML SOPN Inject 0.2 mLs (1.2 mg total) into the skin daily. 2 pen 5  . lisinopril (PRINIVIL,ZESTRIL) 2.5 MG tablet Take 1 tablet (2.5 mg total) by mouth daily. 30 tablet 5  . metFORMIN (GLUCOPHAGE) 1000 MG tablet Take 1 tablet (1,000 mg total) by mouth 2 (two) times daily with a meal. 60 tablet 5  . nitroGLYCERIN (NITROSTAT) 0.4 MG SL tablet Place 1 tablet (0.4 mg total) under the tongue every 5 (five) minutes x 3 doses as needed for chest pain. 90 tablet 3  . Polyethyl Glycol-Propyl Glycol 0.4-0.3 % SOLN 1 drop in each eye every 3 hours as needed. 10 mL 12  . Vitamin D, Ergocalciferol, (DRISDOL) 50000 UNITS CAPS capsule Take 1 capsule (50,000 Units total) by mouth every 7  (seven) days. For 8 weeks 8 capsule 0   No current facility-administered medications for this visit.    Allergies:   Review of patient's allergies indicates no known allergies.    Social History:  The patient  reports that she has never smoked. She has never used smokeless tobacco. She reports that she does not drink alcohol or use illicit drugs.   Family History:  The patient's family history includes CAD in her brother and father; Cancer in her father; Diabetes in her father; Hypertension in her father.    ROS:  Please see the history of present illness.   Otherwise, review of systems are positive for none.   All other systems are reviewed and negative.    PHYSICAL EXAM: VS:  BP 120/66 mmHg  Pulse 82  Ht 5' (1.524 m)  Wt 193 lb 3.2 oz (87.635 kg)  BMI 37.73 kg/m2 , BMI Body mass index is 37.73 kg/(m^2). GEN: Well nourished, well developed, in no acute distress HEENT: normal Neck: no JVD, carotid bruits, or masses Cardiac: RRR; no murmurs, rubs, or gallops,no edema  Respiratory:  clear to auscultation bilaterally, normal work of breathing GI: soft, nontender, nondistended, + BS MS: no deformity or atrophy Skin: warm and dry, no rash Neuro:  Strength and sensation are intact Psych: euthymic mood, full affect   EKG:  EKG is ordered today. The ekg ordered today demonstrates NSR with no ST changes   Recent Labs: 07/20/2014: ALT 18; TSH 3.02 10/11/2014: Hemoglobin 9.9*; Platelets 419* 10/17/2014: BUN 15; Creatinine, Ser 0.93; Potassium 4.4; Sodium 138    Lipid Panel    Component Value Date/Time   CHOL 100 07/20/2014 0934   TRIG 138.0 07/20/2014 0934   HDL 36.90* 07/20/2014 0934   CHOLHDL 3 07/20/2014 0934   VLDL 27.6 07/20/2014 0934   LDLCALC 36 07/20/2014 0934      Wt Readings from Last 3 Encounters:  12/01/14 193 lb 3.2 oz (87.635 kg)  10/20/14 185 lb 9.6 oz (84.188 kg)  10/11/14 187 lb (84.823 kg)      ASSESSMENT AND PLAN:  1.  ASCAD with some mild angina  with exertion.  She will take NTG with relief.  Continue ASA/statin/BB/Plavix.  I will get a Lexiscan myoview to rule out ischemia.   2.  Dyslipidemia - LDL at goal.  Continue statin 3.  DM - per PCP 4.  Mild LV dysfunction with EF 45-50% with akinesis of posterior lateral wall.  Continue BB/ACE I   Current medicines are reviewed at length with the patient today.  The patient does not have concerns regarding medicines.  The following changes have been made:  no change  Labs/ tests ordered today: See above Assessment and Plan No orders of the defined types were placed in this encounter.     Disposition:   FU with me in 1 year  Signed, Sueanne Margarita, MD  12/01/2014 9:51 AM    [facility] [place], [place], [place]  [address] Phone: [phone]; Fax: [phone]

## 2014-12-01 NOTE — Patient Instructions (Signed)
Medication Instructions:  Your physician recommends that you continue on your current medications as directed. Please refer to the Current Medication list given to you today.   Labwork: None  Testing/Procedures: Your physician has requested that you have a lexiscan myoview. For further information please visit [email or web address]. Please follow instruction sheet, as given.  Follow-Up: Your physician wants you to follow-up in: 1 year with Dr. Radford Pax. You will receive a reminder letter in the mail two months in advance. If you don't receive a letter, please call our office to schedule the follow-up appointment.   Any Other Special Instructions Will Be Listed Below (If Applicable).     If you need a refill on your cardiac medications before your next appointment, please call your pharmacy.

## 2014-12-07 ENCOUNTER — Telehealth (HOSPITAL_COMMUNITY): Payer: Self-pay | Admitting: *Deleted

## 2014-12-07 NOTE — Telephone Encounter (Signed)
Attempted to call patient regarding upcoming patient- no answer. Hubbard Robinson, RN

## 2014-12-08 ENCOUNTER — Telehealth (HOSPITAL_COMMUNITY): Payer: Self-pay

## 2014-12-08 NOTE — Telephone Encounter (Signed)
Notified the patient via the language line [phone] (Sam #[phone]) and the patient gave permission for me to give instructions to her husband who speaks english .Patient's husband given detailed instructions per Myocardial Perfusion Study Information Sheet for the test on 12-12-2014 at 0730. Patient's husband notified to arrive 15 minutes early and that it is imperative to arrive on time for appointment to keep from having the test rescheduled. Patient's husband verbalized understanding.Kathleen Holt, Ranger Petrich A

## 2014-12-12 ENCOUNTER — Ambulatory Visit (HOSPITAL_COMMUNITY): Payer: Medicaid Other | Attending: Cardiology

## 2014-12-12 DIAGNOSIS — R0609 Other forms of dyspnea: Secondary | ICD-10-CM | POA: Insufficient documentation

## 2014-12-12 DIAGNOSIS — E119 Type 2 diabetes mellitus without complications: Secondary | ICD-10-CM | POA: Diagnosis not present

## 2014-12-12 DIAGNOSIS — R9439 Abnormal result of other cardiovascular function study: Secondary | ICD-10-CM | POA: Diagnosis not present

## 2014-12-12 DIAGNOSIS — R079 Chest pain, unspecified: Secondary | ICD-10-CM

## 2014-12-12 DIAGNOSIS — I10 Essential (primary) hypertension: Secondary | ICD-10-CM | POA: Diagnosis not present

## 2014-12-12 LAB — MYOCARDIAL PERFUSION IMAGING
CHL CUP NUCLEAR SDS: 6
CHL CUP NUCLEAR SRS: 19
CHL CUP NUCLEAR SSS: 25
LHR: 0.28
LV dias vol: 120 mL
LV sys vol: 74 mL
Peak HR: 102 {beats}/min
Rest HR: 75 {beats}/min
TID: 1.21

## 2014-12-12 MED ORDER — REGADENOSON 0.4 MG/5ML IV SOLN
0.4000 mg | Freq: Once | INTRAVENOUS | Status: AC
  Administered 2014-12-12: 0.4 mg via INTRAVENOUS

## 2014-12-12 MED ORDER — TECHNETIUM TC 99M SESTAMIBI GENERIC - CARDIOLITE
32.7000 | Freq: Once | INTRAVENOUS | Status: AC | PRN
  Administered 2014-12-12: 32.7 via INTRAVENOUS

## 2014-12-12 MED ORDER — TECHNETIUM TC 99M SESTAMIBI GENERIC - CARDIOLITE
11.0000 | Freq: Once | INTRAVENOUS | Status: AC | PRN
Start: 2014-12-12 — End: 2014-12-12
  Administered 2014-12-12: 11 via INTRAVENOUS

## 2014-12-18 NOTE — Progress Notes (Signed)
Cardiology Office Note   Date:  12/19/2014   ID:  Kathleen Holt, DOB 1955-05-04, MRN [id number]   Patient Care Team: Lance Bosch, NP as PCP - General (Internal Medicine) Sueanne Margarita, MD as Consulting Physician (Cardiology)    Chief Complaint  Patient presents with  . Follow-up  . Coronary Artery Disease    Abnormal Myoview     History of Present Illness: Kathleen Holt is a 59 y.o. female with a hx of CAD status post prior PCI with DES 3 in 11/2009 in [place], diabetes, ischemic cardiomyopathy, mitral regurgitation, HL, prior CVA.  Prior patient of Dr. Cleatis Polka.  Last seen by him in 2014. Patient recently established with Dr. Radford Pax 12/01/14. The patient complained of exertional chest discomfort that resolved with nitroglycerin. Myoview was obtained. This was intermediate risk and demonstrated a large anterolateral and inferolateral scar but no ischemia, EF 39%. This was reviewed by Dr. Radford Pax who requested that the patient come in today to be set up for cardiac catheterization.  Here with her husband today.  He helps interpret at her request.  The patient speaks some [place].  She tells me her main symptom was exertional dyspnea with some activities that would resolve with NTG.  No recurrent symptoms since last seen.  She denies chest pain, syncope, orthopnea, PND, edema.     Studies/Reports Reviewed Today:  Myoview 12/12/14 Fixed large, severe basal to apical anterolateral and inferolateral perfusion defect. This suggests prior infarction. No significant ischemia. EF 39% with lateral hypokinesis.  Intermediate risk study (primarily due to EF).   Myoview 2/14 Intermediate stress nuclear study with a large, severe, fixed lateral defect consistent with prior infarct; no ischemia.  LV Ejection Fraction: 46%.  Echo 1/14 EF 45-50%, posterior lateral akinesis, grade 2 diastolic dysfunction, mild MR, trivial effusion   Past Medical History  Diagnosis Date  .  Diabetes mellitus without complication ([place])   . Coronary artery disease     a. s/p stenting 11/2009 [facility]  . Hyperlipidemia   . Diabetic neuropathy ([place])   . Ejection fraction      EF 45-50%,  echo, February 10, 2012, akinesis posterior lateral wall, diastolic dysfunction, mild mitral regurgitation,  . Mitral regurgitation     Mild, echo, January, 2014  . Stroke [facility])     Past Surgical History  Procedure Laterality Date  . Eye surgery    . Abdominal hysterectomy       Current Outpatient Prescriptions  Medication Sig Dispense Refill  . ACCU-CHEK SOFTCLIX LANCETS lancets Use as instructed to check blood sugar 2 times per day dx code E11.40 100 each 3  . aspirin 325 MG EC tablet Take 325 mg by mouth daily.    Marland Kitchen atorvastatin (LIPITOR) 20 MG tablet Take 1 tablet (20 mg total) by mouth daily. 30 tablet 5  . Blood Glucose Monitoring Suppl (ACCU-CHEK AVIVA PLUS) W/DEVICE KIT Use to check blood sugar 2 times per day dx code E11.40 1 kit 1  . carvedilol (COREG) 3.125 MG tablet Take 1 tablet (3.125 mg total) by mouth 2 (two) times daily with a meal. 60 tablet 5  . clopidogrel (PLAVIX) 75 MG tablet Take 1 tablet (75 mg total) by mouth daily. 90 tablet 3  . ferrous sulfate 325 (65 FE) MG tablet Take 1 tablet (325 mg total) by mouth 2 (two) times daily with a meal. 60 tablet 3  . gabapentin (NEURONTIN) 400 MG capsule Take 2 capsules (800 mg  total) by mouth 3 (three) times daily. 240 capsule 5  . glucose blood (ACCU-CHEK AVIVA PLUS) test strip Use as instructed to check blood sugar 2 times per day dx code E11.40 100 each 3  . insulin aspart (NOVOLOG FLEXPEN) 100 UNIT/ML FlexPen Inject 14 Units into the skin 3 (three) times daily with meals. (Patient taking differently: Inject 10-14 Units into the skin 3 (three) times daily with meals. ) 15 pen 2  . Insulin Glargine (LANTUS SOLOSTAR) 100 UNIT/ML Solostar Pen Inject 30 Units into the skin daily at 10 pm. 45 mL 1  .  Insulin Pen Needle 31G X 5 MM MISC Use 5 per day to inject insulin 150 each 2  . Liraglutide 18 MG/3ML SOPN Inject 0.2 mLs (1.2 mg total) into the skin daily. 2 pen 5  . lisinopril (PRINIVIL,ZESTRIL) 2.5 MG tablet Take 1 tablet (2.5 mg total) by mouth daily. 30 tablet 5  . metFORMIN (GLUCOPHAGE) 1000 MG tablet Take 1 tablet (1,000 mg total) by mouth 2 (two) times daily with a meal. 60 tablet 5  . nitroGLYCERIN (NITROSTAT) 0.4 MG SL tablet Place 1 tablet (0.4 mg total) under the tongue every 5 (five) minutes x 3 doses as needed for chest pain. 25 tablet 3  . Polyethyl Glycol-Propyl Glycol 0.4-0.3 % SOLN 1 drop in each eye every 3 hours as needed. (Patient taking differently: 1 drop in each eye every 3 hours as needed for dry eyes.) 10 mL 12  . Vitamin D, Ergocalciferol, (DRISDOL) 50000 UNITS CAPS capsule Take 1 capsule (50,000 Units total) by mouth every 7 (seven) days. For 8 weeks 8 capsule 0   No current facility-administered medications for this visit.    Allergies:   Review of patient's allergies indicates no known allergies.    Social History:   Social History   Social History  . Marital Status: Married    Spouse Name: N/A  . Number of Children: 3  . Years of Education: N/A   Occupational History  . Housewife    Social History Main Topics  . Smoking status: Never Smoker   . Smokeless tobacco: Never Used  . Alcohol Use: No  . Drug Use: No  . Sexual Activity: Yes    Birth Control/ Protection: Post-menopausal   Other Topics Concern  . None   Social History Narrative     Family History:   Family History  Problem Relation Age of Onset  . CAD Father     MI at age 22  . Diabetes Father   . Hypertension Father   . Cancer Father   . CAD Brother       ROS:   Please see the history of present illness.   Review of Systems  All other systems reviewed and are negative.     PHYSICAL EXAM: VS:  BP 108/58 mmHg  Pulse 79  Ht 5' (1.524 m)  Wt 188 lb 6.4 oz (85.458 kg)   BMI 36.79 kg/m2    Wt Readings from Last 3 Encounters:  12/19/14 188 lb 6.4 oz (85.458 kg)  12/12/14 193 lb (87.544 kg)  12/01/14 193 lb 3.2 oz (87.635 kg)     GEN: Well nourished, well developed, in no acute distress HEENT: normal Neck: no JVD, no carotid bruits, no masses Cardiac:  Normal S1/S2, RRR; no murmur ,  no rubs or gallops, no edema; no FA bruits bilaterally, DP/PT 2+ bilaterally    Respiratory:  clear to auscultation bilaterally, no wheezing, rhonchi or rales. GI:  soft, nontender, nondistended, + BS MS: no deformity or atrophy Skin: warm and dry  Neuro:  CNs II-XII intact, Strength and sensation are intact Psych: Normal affect   EKG:  EKG is ordered today.  It demonstrates:   NSR, HR 79, normal axis, QTc 428 ms, no change from prior tracing   Recent Labs: 07/20/2014: ALT 18; TSH 3.02 10/11/2014: Hemoglobin 9.9*; Platelets 419* 10/17/2014: BUN 15; Creatinine, Ser 0.93; Potassium 4.4; Sodium 138    Lipid Panel    Component Value Date/Time   CHOL 100 07/20/2014 0934   TRIG 138.0 07/20/2014 0934   HDL 36.90* 07/20/2014 0934   CHOLHDL 3 07/20/2014 0934   VLDL 27.6 07/20/2014 0934   LDLCALC 36 07/20/2014 0934      ASSESSMENT AND PLAN:  1. CAD:  Patient with prior hx of PCI in 2011 in [place] with DES x 3.  She was recently seen for exertional chest symptoms and dyspnea and a Myoview was intermediate risk with with a large anterolateral and inferolateral scar, no ischemia.  As noted, Dr. Radford Pax has requested the patient undergo cardiac cath.  Risks and benefits of cardiac catheterization have been discussed with the patient.  These include bleeding, infection, kidney damage, stroke, heart attack, death.  The patient understands these risks and is willing to proceed.  Will arrange LHC in the next 1-2 weeks.  2. Ischemic Cardiomyopathy:  Continue beta-blocker, ACE inhibitor.    3. Hyperlipidemia:  Continue statin.   4. HTN:  Controlled.   5. Diabetes Mellitus:  Give  1/2 dose Lantus the night before her cath, hold Novolog while NPO, hold Metformin 24 hours before and 48 hours after cath and hold Liraglutide the AM of cath.       Medication Changes: Current medicines are reviewed at length with the patient today.  Concerns regarding medicines are as outlined above.  The following changes have been made:   Discontinued Medications   ASPIRIN 81 MG TABLET    Take 1 tablet (81 mg total) by mouth daily.   Modified Medications   No medications on file   New Prescriptions   No medications on file   Labs/ tests ordered today include:   No orders of the defined types were placed in this encounter.     Disposition:    FU with Dr. Fransico Him 2 weeks post cath.     Signed, Versie Starks, MHS 12/19/2014 11:08 AM    [facility] [place], [place], [place]  [address] Phone: [phone]; Fax: [phone]

## 2014-12-19 ENCOUNTER — Encounter: Payer: Self-pay | Admitting: *Deleted

## 2014-12-19 ENCOUNTER — Encounter: Payer: Self-pay | Admitting: Physician Assistant

## 2014-12-19 ENCOUNTER — Ambulatory Visit (INDEPENDENT_AMBULATORY_CARE_PROVIDER_SITE_OTHER): Payer: Medicaid Other | Admitting: Physician Assistant

## 2014-12-19 VITALS — BP 108/58 | HR 79 | Ht 60.0 in | Wt 188.4 lb

## 2014-12-19 DIAGNOSIS — E1142 Type 2 diabetes mellitus with diabetic polyneuropathy: Secondary | ICD-10-CM

## 2014-12-19 DIAGNOSIS — E785 Hyperlipidemia, unspecified: Secondary | ICD-10-CM | POA: Diagnosis not present

## 2014-12-19 DIAGNOSIS — I255 Ischemic cardiomyopathy: Secondary | ICD-10-CM | POA: Diagnosis not present

## 2014-12-19 DIAGNOSIS — I25119 Atherosclerotic heart disease of native coronary artery with unspecified angina pectoris: Secondary | ICD-10-CM

## 2014-12-19 DIAGNOSIS — I10 Essential (primary) hypertension: Secondary | ICD-10-CM

## 2014-12-19 LAB — CBC WITH DIFFERENTIAL/PLATELET
BASOS PCT: 0 % (ref 0–1)
Basophils Absolute: 0 10*3/uL (ref 0.0–0.1)
Eosinophils Absolute: 0.1 10*3/uL (ref 0.0–0.7)
Eosinophils Relative: 1 % (ref 0–5)
HCT: 32.1 % — ABNORMAL LOW (ref 36.0–46.0)
HEMOGLOBIN: 10 g/dL — AB (ref 12.0–15.0)
Lymphocytes Relative: 28 % (ref 12–46)
Lymphs Abs: 2.2 10*3/uL (ref 0.7–4.0)
MCH: 20.1 pg — AB (ref 26.0–34.0)
MCHC: 31.2 g/dL (ref 30.0–36.0)
MCV: 64.6 fL — ABNORMAL LOW (ref 78.0–100.0)
MONO ABS: 0.6 10*3/uL (ref 0.1–1.0)
MONOS PCT: 8 % (ref 3–12)
MPV: 9.4 fL (ref 8.6–12.4)
NEUTROS ABS: 5 10*3/uL (ref 1.7–7.7)
Neutrophils Relative %: 63 % (ref 43–77)
Platelets: 438 10*3/uL — ABNORMAL HIGH (ref 150–400)
RBC: 4.97 MIL/uL (ref 3.87–5.11)
RDW: 17.9 % — ABNORMAL HIGH (ref 11.5–15.5)
WBC: 7.9 10*3/uL (ref 4.0–10.5)

## 2014-12-19 LAB — BASIC METABOLIC PANEL
BUN: 17 mg/dL (ref 7–25)
CALCIUM: 9.4 mg/dL (ref 8.6–10.4)
CO2: 26 mmol/L (ref 20–31)
CREATININE: 0.97 mg/dL (ref 0.50–0.99)
Chloride: 102 mmol/L (ref 98–110)
GLUCOSE: 99 mg/dL (ref 65–99)
Potassium: 4.6 mmol/L (ref 3.5–5.3)
Sodium: 138 mmol/L (ref 135–146)

## 2014-12-19 LAB — PROTIME-INR
INR: 0.94 (ref <1.50)
PROTHROMBIN TIME: 12.7 s (ref 11.6–15.2)

## 2014-12-19 NOTE — Patient Instructions (Signed)
Medication Instructions:  Your physician recommends that you continue on your current medications as directed. Please refer to the Current Medication list given to you today.   Labwork: TODAY BMET, CBC W/DIFF, PT/INR LABS FOR PRE CATH  Testing/Procedures: Your physician has requested that you have a cardiac catheterization. Cardiac catheterization is used to diagnose and/or treat various heart conditions. Doctors may recommend this procedure for a number of different reasons. The most common reason is to evaluate chest pain. Chest pain can be a symptom of coronary artery disease (CAD), and cardiac catheterization can show whether plaque is narrowing or blocking your heart's arteries. This procedure is also used to evaluate the valves, as well as measure the blood flow and oxygen levels in different parts of your heart. For further information please visit [email or web address]. Please follow instruction sheet, as given.  Follow-Up: 2 WEEKS WITH DR. Radford Pax POST CATH  Any Other Special Instructions Will Be Listed Below (If Applicable).     If you need a refill on your cardiac medications before your next appointment, please call your pharmacy.

## 2014-12-21 ENCOUNTER — Telehealth: Payer: Self-pay | Admitting: *Deleted

## 2014-12-21 NOTE — Telephone Encounter (Signed)
DPR on file for husband also see permanet comments section. Husband notified of lab results and findings and ok to proceed with cath 12/5. Husband stated he understood instructions and information.

## 2014-12-22 ENCOUNTER — Encounter (HOSPITAL_COMMUNITY): Payer: Self-pay | Admitting: Pharmacy Technician

## 2014-12-26 ENCOUNTER — Encounter (HOSPITAL_COMMUNITY): Payer: Self-pay | Admitting: General Practice

## 2014-12-26 ENCOUNTER — Ambulatory Visit (HOSPITAL_COMMUNITY)
Admission: RE | Admit: 2014-12-26 | Discharge: 2014-12-27 | Disposition: A | Discharge status: Home / Self Care | Payer: Medicaid Other | Source: Ambulatory Visit | Attending: Interventional Cardiology | Admitting: Interventional Cardiology

## 2014-12-26 ENCOUNTER — Encounter (HOSPITAL_COMMUNITY)
Admission: RE | Disposition: A | Discharge status: Home / Self Care | Payer: Self-pay | Source: Ambulatory Visit | Attending: Interventional Cardiology

## 2014-12-26 DIAGNOSIS — I340 Nonrheumatic mitral (valve) insufficiency: Secondary | ICD-10-CM | POA: Diagnosis not present

## 2014-12-26 DIAGNOSIS — Z8249 Family history of ischemic heart disease and other diseases of the circulatory system: Secondary | ICD-10-CM | POA: Diagnosis not present

## 2014-12-26 DIAGNOSIS — I10 Essential (primary) hypertension: Secondary | ICD-10-CM | POA: Insufficient documentation

## 2014-12-26 DIAGNOSIS — I25119 Atherosclerotic heart disease of native coronary artery with unspecified angina pectoris: Secondary | ICD-10-CM | POA: Diagnosis not present

## 2014-12-26 DIAGNOSIS — I255 Ischemic cardiomyopathy: Secondary | ICD-10-CM | POA: Insufficient documentation

## 2014-12-26 DIAGNOSIS — I2582 Chronic total occlusion of coronary artery: Secondary | ICD-10-CM | POA: Diagnosis not present

## 2014-12-26 DIAGNOSIS — E1140 Type 2 diabetes mellitus with diabetic neuropathy, unspecified: Secondary | ICD-10-CM | POA: Diagnosis not present

## 2014-12-26 DIAGNOSIS — Z794 Long term (current) use of insulin: Secondary | ICD-10-CM | POA: Insufficient documentation

## 2014-12-26 DIAGNOSIS — Z955 Presence of coronary angioplasty implant and graft: Secondary | ICD-10-CM | POA: Insufficient documentation

## 2014-12-26 DIAGNOSIS — E785 Hyperlipidemia, unspecified: Secondary | ICD-10-CM | POA: Diagnosis not present

## 2014-12-26 DIAGNOSIS — Z8673 Personal history of transient ischemic attack (TIA), and cerebral infarction without residual deficits: Secondary | ICD-10-CM | POA: Diagnosis not present

## 2014-12-26 DIAGNOSIS — I2510 Atherosclerotic heart disease of native coronary artery without angina pectoris: Secondary | ICD-10-CM | POA: Diagnosis present

## 2014-12-26 DIAGNOSIS — Z79899 Other long term (current) drug therapy: Secondary | ICD-10-CM | POA: Diagnosis not present

## 2014-12-26 DIAGNOSIS — R9439 Abnormal result of other cardiovascular function study: Secondary | ICD-10-CM | POA: Diagnosis present

## 2014-12-26 HISTORY — PX: CARDIAC CATHETERIZATION: SHX172

## 2014-12-26 HISTORY — DX: Type 2 diabetes mellitus without complications: E11.9

## 2014-12-26 HISTORY — DX: Low back pain: M54.5

## 2014-12-26 HISTORY — DX: Other constipation: K59.09

## 2014-12-26 HISTORY — DX: Other chronic pain: G89.29

## 2014-12-26 HISTORY — DX: Acute myocardial infarction, unspecified: I21.9

## 2014-12-26 HISTORY — DX: Low back pain, unspecified: M54.50

## 2014-12-26 LAB — GLUCOSE, CAPILLARY
GLUCOSE-CAPILLARY: 117 mg/dL — AB (ref 65–99)
GLUCOSE-CAPILLARY: 98 mg/dL (ref 65–99)
Glucose-Capillary: 109 mg/dL — ABNORMAL HIGH (ref 65–99)
Glucose-Capillary: 141 mg/dL — ABNORMAL HIGH (ref 65–99)

## 2014-12-26 LAB — POCT ACTIVATED CLOTTING TIME: Activated Clotting Time: 482 seconds

## 2014-12-26 SURGERY — LEFT HEART CATH AND CORONARY ANGIOGRAPHY

## 2014-12-26 MED ORDER — BIVALIRUDIN 250 MG IV SOLR
INTRAVENOUS | Status: AC
  Filled 2014-12-26: qty 250

## 2014-12-26 MED ORDER — MIDAZOLAM HCL 2 MG/2ML IJ SOLN
INTRAMUSCULAR | Status: AC
  Filled 2014-12-26: qty 2

## 2014-12-26 MED ORDER — BISACODYL 5 MG PO TBEC
5.0000 mg | DELAYED_RELEASE_TABLET | Freq: Every day | ORAL | Status: DC | PRN
  Administered 2014-12-27: 5 mg via ORAL
  Filled 2014-12-26: qty 1

## 2014-12-26 MED ORDER — INSULIN ASPART 100 UNIT/ML ~~LOC~~ SOLN
14.0000 [IU] | Freq: Three times a day (TID) | SUBCUTANEOUS | Status: DC
  Administered 2014-12-26 – 2014-12-27 (×2): 14 [IU] via SUBCUTANEOUS

## 2014-12-26 MED ORDER — HEPARIN SODIUM (PORCINE) 1000 UNIT/ML IJ SOLN
INTRAMUSCULAR | Status: AC
  Filled 2014-12-26: qty 1

## 2014-12-26 MED ORDER — VERAPAMIL HCL 2.5 MG/ML IV SOLN
INTRAVENOUS | Status: AC
  Filled 2014-12-26: qty 2

## 2014-12-26 MED ORDER — CARVEDILOL 3.125 MG PO TABS
3.1250 mg | ORAL_TABLET | Freq: Two times a day (BID) | ORAL | Status: DC
  Administered 2014-12-26 – 2014-12-27 (×2): 3.125 mg via ORAL
  Filled 2014-12-26 (×2): qty 1

## 2014-12-26 MED ORDER — BIVALIRUDIN BOLUS VIA INFUSION - CUPID
INTRAVENOUS | Status: DC | PRN
  Administered 2014-12-26: 61.2 mg via INTRAVENOUS

## 2014-12-26 MED ORDER — SODIUM CHLORIDE 0.9 % IV SOLN: 250.0000 mL | INTRAVENOUS | Status: DC | PRN

## 2014-12-26 MED ORDER — LIRAGLUTIDE 18 MG/3ML ~~LOC~~ SOPN
1.2000 mg | PEN_INJECTOR | Freq: Every day | SUBCUTANEOUS | Status: DC
  Administered 2014-12-26: 1.2 mg via SUBCUTANEOUS

## 2014-12-26 MED ORDER — ATORVASTATIN CALCIUM 20 MG PO TABS
20.0000 mg | ORAL_TABLET | Freq: Every day | ORAL | Status: DC
  Administered 2014-12-26 – 2014-12-27 (×2): 20 mg via ORAL
  Filled 2014-12-26 (×2): qty 1

## 2014-12-26 MED ORDER — MIDAZOLAM HCL 2 MG/2ML IJ SOLN
INTRAMUSCULAR | Status: DC | PRN
  Administered 2014-12-26: 1 mg via INTRAVENOUS
  Administered 2014-12-26: 2 mg via INTRAVENOUS
  Administered 2014-12-26: 1 mg via INTRAVENOUS

## 2014-12-26 MED ORDER — IOHEXOL 350 MG/ML SOLN
INTRAVENOUS | Status: DC | PRN
  Administered 2014-12-26: 150 mL via INTRA_ARTERIAL

## 2014-12-26 MED ORDER — ASPIRIN 81 MG PO CHEW
81.0000 mg | CHEWABLE_TABLET | Freq: Every day | ORAL | Status: DC
  Administered 2014-12-27: 81 mg via ORAL
  Filled 2014-12-26: qty 1

## 2014-12-26 MED ORDER — CLOPIDOGREL BISULFATE 300 MG PO TABS
ORAL_TABLET | ORAL | Status: DC | PRN
  Administered 2014-12-26: 300 mg via ORAL

## 2014-12-26 MED ORDER — FENTANYL CITRATE (PF) 100 MCG/2ML IJ SOLN
INTRAMUSCULAR | Status: DC | PRN
  Administered 2014-12-26 (×4): 25 ug via INTRAVENOUS

## 2014-12-26 MED ORDER — NITROGLYCERIN 1 MG/10 ML FOR IR/CATH LAB
INTRA_ARTERIAL | Status: AC
  Filled 2014-12-26: qty 10

## 2014-12-26 MED ORDER — ASPIRIN 81 MG PO CHEW
81.0000 mg | CHEWABLE_TABLET | ORAL | Status: DC
Start: 2014-12-26 — End: 2014-12-26

## 2014-12-26 MED ORDER — SODIUM CHLORIDE 0.9 % IJ SOLN: 3.0000 mL | INTRAMUSCULAR | Status: DC | PRN

## 2014-12-26 MED ORDER — SODIUM CHLORIDE 0.9 % IV SOLN
250.0000 mg | INTRAVENOUS | Status: DC | PRN
  Administered 2014-12-26: 1.75 mg/kg/h via INTRAVENOUS

## 2014-12-26 MED ORDER — NITROGLYCERIN 1 MG/10 ML FOR IR/CATH LAB
INTRA_ARTERIAL | Status: DC | PRN
  Administered 2014-12-26: 14:00:00

## 2014-12-26 MED ORDER — SODIUM CHLORIDE 0.9 % WEIGHT BASED INFUSION: 1.0000 mL/kg/h | INTRAVENOUS | Status: AC

## 2014-12-26 MED ORDER — CLOPIDOGREL BISULFATE 300 MG PO TABS
ORAL_TABLET | ORAL | Status: AC
  Filled 2014-12-26: qty 1

## 2014-12-26 MED ORDER — SODIUM CHLORIDE 0.9 % IJ SOLN: 3.0000 mL | Freq: Two times a day (BID) | INTRAMUSCULAR | Status: DC

## 2014-12-26 MED ORDER — HEPARIN (PORCINE) IN NACL 2-0.9 UNIT/ML-% IJ SOLN
INTRAMUSCULAR | Status: AC
  Filled 2014-12-26: qty 1000

## 2014-12-26 MED ORDER — INSULIN GLARGINE 100 UNIT/ML ~~LOC~~ SOLN
30.0000 [IU] | Freq: Every day | SUBCUTANEOUS | Status: DC
  Filled 2014-12-26 (×2): qty 0.3

## 2014-12-26 MED ORDER — ONDANSETRON HCL 4 MG/2ML IJ SOLN: 4.0000 mg | Freq: Four times a day (QID) | INTRAMUSCULAR | Status: DC | PRN

## 2014-12-26 MED ORDER — SODIUM CHLORIDE 0.9 % IJ SOLN
3.0000 mL | Freq: Two times a day (BID) | INTRAMUSCULAR | Status: DC
  Administered 2014-12-26: 18:00:00 3 mL via INTRAVENOUS

## 2014-12-26 MED ORDER — ACETAMINOPHEN 325 MG PO TABS: 650.0000 mg | ORAL_TABLET | ORAL | Status: DC | PRN

## 2014-12-26 MED ORDER — GABAPENTIN 400 MG PO CAPS
800.0000 mg | ORAL_CAPSULE | Freq: Three times a day (TID) | ORAL | Status: DC
  Administered 2014-12-26 – 2014-12-27 (×3): 800 mg via ORAL
  Filled 2014-12-26 (×4): qty 2
  Filled 2014-12-26: qty 8
  Filled 2014-12-26: qty 2

## 2014-12-26 MED ORDER — HEPARIN SODIUM (PORCINE) 1000 UNIT/ML IJ SOLN
INTRAMUSCULAR | Status: DC | PRN
  Administered 2014-12-26: 4000 [IU] via INTRAVENOUS

## 2014-12-26 MED ORDER — METFORMIN HCL 500 MG PO TABS: 1000.0000 mg | ORAL_TABLET | Freq: Two times a day (BID) | ORAL | Status: DC

## 2014-12-26 MED ORDER — NITROGLYCERIN 0.4 MG/SPRAY TL SOLN
Status: AC
  Filled 2014-12-26: qty 4.9

## 2014-12-26 MED ORDER — CLOPIDOGREL BISULFATE 75 MG PO TABS: 75.0000 mg | ORAL_TABLET | Freq: Every day | ORAL | Status: DC

## 2014-12-26 MED ORDER — VERAPAMIL HCL 2.5 MG/ML IV SOLN
INTRAVENOUS | Status: DC | PRN
  Administered 2014-12-26: 10 mL via INTRA_ARTERIAL

## 2014-12-26 MED ORDER — FENTANYL CITRATE (PF) 100 MCG/2ML IJ SOLN
INTRAMUSCULAR | Status: AC
  Filled 2014-12-26: qty 2

## 2014-12-26 MED ORDER — CLOPIDOGREL BISULFATE 75 MG PO TABS
75.0000 mg | ORAL_TABLET | Freq: Every day | ORAL | Status: DC
  Administered 2014-12-27: 07:00:00 75 mg via ORAL
  Filled 2014-12-26: qty 1

## 2014-12-26 MED ORDER — LIDOCAINE HCL (PF) 1 % IJ SOLN
INTRAMUSCULAR | Status: AC
  Filled 2014-12-26: qty 30

## 2014-12-26 MED ORDER — NITROGLYCERIN 0.4 MG SL SUBL: 0.4000 mg | SUBLINGUAL_TABLET | SUBLINGUAL | Status: DC | PRN

## 2014-12-26 MED ORDER — LIDOCAINE HCL (PF) 1 % IJ SOLN
INTRAMUSCULAR | Status: DC | PRN
  Administered 2014-12-26: 2 mL

## 2014-12-26 MED ORDER — LISINOPRIL 2.5 MG PO TABS
2.5000 mg | ORAL_TABLET | Freq: Every day | ORAL | Status: DC
  Administered 2014-12-26 – 2014-12-27 (×2): 2.5 mg via ORAL
  Filled 2014-12-26 (×2): qty 1

## 2014-12-26 MED ORDER — SODIUM CHLORIDE 0.9 % IV SOLN
INTRAVENOUS | Status: DC
  Administered 2014-12-26: 07:00:00 via INTRAVENOUS

## 2014-12-26 MED ORDER — NITROGLYCERIN 1 MG/10 ML FOR IR/CATH LAB
INTRA_ARTERIAL | Status: DC | PRN
  Administered 2014-12-26 (×2): 200 ug via INTRA_ARTERIAL

## 2014-12-26 SURGICAL SUPPLY — 27 items
BALLN EMERGE MR 2.0X20 (BALLOONS) ×2
BALLN MINITREK OTW 2.0X12 (BALLOONS) ×2
BALLN ~~LOC~~ EUPHORA RX 3.5X20 (BALLOONS) ×2
BALLOON EMERGE MR 2.0X20 (BALLOONS) ×1 IMPLANT
BALLOON MINITREK OTW 2.0X12 (BALLOONS) ×1 IMPLANT
BALLOON ~~LOC~~ EUPHORA RX 3.5X20 (BALLOONS) ×1 IMPLANT
CATH INFINITI 5 FR JL3.5 (CATHETERS) ×2 IMPLANT
CATH INFINITI 5FR ANG PIGTAIL (CATHETERS) ×2 IMPLANT
CATH INFINITI JR4 5F (CATHETERS) ×2 IMPLANT
CATH VISTA GUIDE 6FR 3DRC (CATHETERS) ×2 IMPLANT
DEVICE RAD COMP TR BAND LRG (VASCULAR PRODUCTS) ×2 IMPLANT
GLIDESHEATH SLEND SS 6F .021 (SHEATH) ×2 IMPLANT
GUIDE CATH RUNWAY 6FR FR4 (CATHETERS) ×2 IMPLANT
KIT ENCORE 26 ADVANTAGE (KITS) ×2 IMPLANT
KIT HEART LEFT (KITS) ×2 IMPLANT
PACK CARDIAC CATHETERIZATION (CUSTOM PROCEDURE TRAY) ×2 IMPLANT
STENT SYNERGY DES 2.25X16 (Permanent Stent) ×2 IMPLANT
STENT SYNERGY DES 2.5X38 (Permanent Stent) ×2 IMPLANT
STENT SYNERGY DES 3X32 (Permanent Stent) ×2 IMPLANT
SYR MEDRAD MARK V 150ML (SYRINGE) ×2 IMPLANT
TRANSDUCER W/STOPCOCK (MISCELLANEOUS) ×2 IMPLANT
TUBING CIL FLEX 10 FLL-RA (TUBING) ×2 IMPLANT
VALVE GUARDIAN II ~~LOC~~ HEMO (MISCELLANEOUS) ×2 IMPLANT
WIRE ASAHI FIELDER XT 190CM (WIRE) ×4 IMPLANT
WIRE ASAHI MIRACLEBROS-3 180CM (WIRE) ×2 IMPLANT
WIRE ASAHI PROWATER 300CM (WIRE) ×2 IMPLANT
WIRE SAFE-T 1.5MM-J .035X260CM (WIRE) ×2 IMPLANT

## 2014-12-26 NOTE — Progress Notes (Signed)
TR BAND REMOVAL  LOCATION:    right radial  DEFLATED PER PROTOCOL:    Yes.    TIME BAND OFF / DRESSING APPLIED:    1630   SITE UPON ARRIVAL:    Level 0  SITE AFTER BAND REMOVAL:    Level 0  CIRCULATION SENSATION AND MOVEMENT:    Within Normal Limits   Yes.    COMMENTS:   Tolerated procedure well 

## 2014-12-26 NOTE — Progress Notes (Addendum)
Spent at least 30 mins discussing cath lab finding and further plan. Extensively educated regarding lifestyle modification including diabetic management, regular exercise and heart healthy dietary compliance. She is native speaking to [place] (same as mine).   Please call with further translation help.  [place], [facility] Pager [phone]

## 2014-12-26 NOTE — Interval H&P Note (Signed)
Cath Lab Visit (complete for each Cath Lab visit)  Clinical Evaluation Leading to the Procedure:   ACS: No.  Non-ACS:    Anginal Classification: CCS III  Anti-ischemic medical therapy: Minimal Therapy (1 class of medications)  Non-Invasive Test Results: Intermediate-risk stress test findings: cardiac mortality 1-3%/year  Prior CABG: No previous CABG      History and Physical Interval Note:  12/26/2014 12:05 PM  Kathleen Holt  has presented today for surgery, with the diagnosis of cad/abnormal mioview  The various methods of treatment have been discussed with the patient and family. After consideration of risks, benefits and other options for treatment, the patient has consented to  Procedure(s): Left Heart Cath and Coronary Angiography (N/A) as a surgical intervention .  The patient's history has been reviewed, patient examined, no change in status, stable for surgery.  I have reviewed the patient's chart and labs.  Questions were answered to the patient's satisfaction.     Satoria Dunlop S.

## 2014-12-26 NOTE — H&P (View-Only) (Signed)
Cardiology Office Note   Date:  12/19/2014   ID:  Kathleen Holt, DOB [birth year], MRN [id number]   Patient Care Team: Lance Bosch, NP as PCP - General (Internal Medicine) Sueanne Margarita, MD as Consulting Physician (Cardiology)    Chief Complaint  Patient presents with  . Follow-up  . Coronary Artery Disease    Abnormal Myoview     History of Present Illness: Kathleen Holt is a 59 y.o. female with a hx of CAD status post prior PCI with DES 3 in 11/2009 in [place], diabetes, ischemic cardiomyopathy, mitral regurgitation, HL, prior CVA.  Prior patient of Dr. Cleatis Polka.  Last seen by him in 2014. Patient recently established with Dr. Radford Pax 12/01/14. The patient complained of exertional chest discomfort that resolved with nitroglycerin. Myoview was obtained. This was intermediate risk and demonstrated a large anterolateral and inferolateral scar but no ischemia, EF 39%. This was reviewed by Dr. Radford Pax who requested that the patient come in today to be set up for cardiac catheterization.  Here with her husband today.  He helps interpret at her request.  The patient speaks some [place].  She tells me her main symptom was exertional dyspnea with some activities that would resolve with NTG.  No recurrent symptoms since last seen.  She denies chest pain, syncope, orthopnea, PND, edema.     Studies/Reports Reviewed Today:  Myoview 12/12/14 Fixed large, severe basal to apical anterolateral and inferolateral perfusion defect. This suggests prior infarction. No significant ischemia. EF 39% with lateral hypokinesis.  Intermediate risk study (primarily due to EF).   Myoview 2/14 Intermediate stress nuclear study with a large, severe, fixed lateral defect consistent with prior infarct; no ischemia.  LV Ejection Fraction: 46%.  Echo 1/14 EF 45-50%, posterior lateral akinesis, grade 2 diastolic dysfunction, mild MR, trivial effusion   Past Medical History  Diagnosis Date  .  Diabetes mellitus without complication ([place])   . Coronary artery disease     a. s/p stenting 11/2009 [facility]  . Hyperlipidemia   . Diabetic neuropathy ([place])   . Ejection fraction      EF 45-50%,  echo, February 10, 2012, akinesis posterior lateral wall, diastolic dysfunction, mild mitral regurgitation,  . Mitral regurgitation     Mild, echo, January, 2014  . Stroke [facility])     Past Surgical History  Procedure Laterality Date  . Eye surgery    . Abdominal hysterectomy       Current Outpatient Prescriptions  Medication Sig Dispense Refill  . ACCU-CHEK SOFTCLIX LANCETS lancets Use as instructed to check blood sugar 2 times per day dx code E11.40 100 each 3  . aspirin 325 MG EC tablet Take 325 mg by mouth daily.    Marland Kitchen atorvastatin (LIPITOR) 20 MG tablet Take 1 tablet (20 mg total) by mouth daily. 30 tablet 5  . Blood Glucose Monitoring Suppl (ACCU-CHEK AVIVA PLUS) W/DEVICE KIT Use to check blood sugar 2 times per day dx code E11.40 1 kit 1  . carvedilol (COREG) 3.125 MG tablet Take 1 tablet (3.125 mg total) by mouth 2 (two) times daily with a meal. 60 tablet 5  . clopidogrel (PLAVIX) 75 MG tablet Take 1 tablet (75 mg total) by mouth daily. 90 tablet 3  . ferrous sulfate 325 (65 FE) MG tablet Take 1 tablet (325 mg total) by mouth 2 (two) times daily with a meal. 60 tablet 3  . gabapentin (NEURONTIN) 400 MG capsule Take 2 capsules (800 mg  total) by mouth 3 (three) times daily. 240 capsule 5  . glucose blood (ACCU-CHEK AVIVA PLUS) test strip Use as instructed to check blood sugar 2 times per day dx code E11.40 100 each 3  . insulin aspart (NOVOLOG FLEXPEN) 100 UNIT/ML FlexPen Inject 14 Units into the skin 3 (three) times daily with meals. (Patient taking differently: Inject 10-14 Units into the skin 3 (three) times daily with meals. ) 15 pen 2  . Insulin Glargine (LANTUS SOLOSTAR) 100 UNIT/ML Solostar Pen Inject 30 Units into the skin daily at 10 pm. 45 mL 1  .  Insulin Pen Needle 31G X 5 MM MISC Use 5 per day to inject insulin 150 each 2  . Liraglutide 18 MG/3ML SOPN Inject 0.2 mLs (1.2 mg total) into the skin daily. 2 pen 5  . lisinopril (PRINIVIL,ZESTRIL) 2.5 MG tablet Take 1 tablet (2.5 mg total) by mouth daily. 30 tablet 5  . metFORMIN (GLUCOPHAGE) 1000 MG tablet Take 1 tablet (1,000 mg total) by mouth 2 (two) times daily with a meal. 60 tablet 5  . nitroGLYCERIN (NITROSTAT) 0.4 MG SL tablet Place 1 tablet (0.4 mg total) under the tongue every 5 (five) minutes x 3 doses as needed for chest pain. 25 tablet 3  . Polyethyl Glycol-Propyl Glycol 0.4-0.3 % SOLN 1 drop in each eye every 3 hours as needed. (Patient taking differently: 1 drop in each eye every 3 hours as needed for dry eyes.) 10 mL 12  . Vitamin D, Ergocalciferol, (DRISDOL) 50000 UNITS CAPS capsule Take 1 capsule (50,000 Units total) by mouth every 7 (seven) days. For 8 weeks 8 capsule 0   No current facility-administered medications for this visit.    Allergies:   Review of patient's allergies indicates no known allergies.    Social History:   Social History   Social History  . Marital Status: Married    Spouse Name: N/A  . Number of Children: 3  . Years of Education: N/A   Occupational History  . Housewife    Social History Main Topics  . Smoking status: Never Smoker   . Smokeless tobacco: Never Used  . Alcohol Use: No  . Drug Use: No  . Sexual Activity: Yes    Birth Control/ Protection: Post-menopausal   Other Topics Concern  . None   Social History Narrative     Family History:   Family History  Problem Relation Age of Onset  . CAD Father     MI at age 57  . Diabetes Father   . Hypertension Father   . Cancer Father   . CAD Brother       ROS:   Please see the history of present illness.   Review of Systems  All other systems reviewed and are negative.     PHYSICAL EXAM: VS:  BP 108/58 mmHg  Pulse 79  Ht 5' (1.524 m)  Wt 188 lb 6.4 oz (85.458 kg)   BMI 36.79 kg/m2    Wt Readings from Last 3 Encounters:  12/19/14 188 lb 6.4 oz (85.458 kg)  12/12/14 193 lb (87.544 kg)  12/01/14 193 lb 3.2 oz (87.635 kg)     GEN: Well nourished, well developed, in no acute distress HEENT: normal Neck: no JVD, no carotid bruits, no masses Cardiac:  Normal S1/S2, RRR; no murmur ,  no rubs or gallops, no edema; no FA bruits bilaterally, DP/PT 2+ bilaterally    Respiratory:  clear to auscultation bilaterally, no wheezing, rhonchi or rales. GI:  soft, nontender, nondistended, + BS MS: no deformity or atrophy Skin: warm and dry  Neuro:  CNs II-XII intact, Strength and sensation are intact Psych: Normal affect   EKG:  EKG is ordered today.  It demonstrates:   NSR, HR 79, normal axis, QTc 428 ms, no change from prior tracing   Recent Labs: 07/20/2014: ALT 18; TSH 3.02 10/11/2014: Hemoglobin 9.9*; Platelets 419* 10/17/2014: BUN 15; Creatinine, Ser 0.93; Potassium 4.4; Sodium 138    Lipid Panel    Component Value Date/Time   CHOL 100 07/20/2014 0934   TRIG 138.0 07/20/2014 0934   HDL 36.90* 07/20/2014 0934   CHOLHDL 3 07/20/2014 0934   VLDL 27.6 07/20/2014 0934   LDLCALC 36 07/20/2014 0934      ASSESSMENT AND PLAN:  1. CAD:  Patient with prior hx of PCI in 2011 in [place] with DES x 3.  She was recently seen for exertional chest symptoms and dyspnea and a Myoview was intermediate risk with with a large anterolateral and inferolateral scar, no ischemia.  As noted, Dr. Radford Pax has requested the patient undergo cardiac cath.  Risks and benefits of cardiac catheterization have been discussed with the patient.  These include bleeding, infection, kidney damage, stroke, heart attack, death.  The patient understands these risks and is willing to proceed.  Will arrange LHC in the next 1-2 weeks.  2. Ischemic Cardiomyopathy:  Continue beta-blocker, ACE inhibitor.    3. Hyperlipidemia:  Continue statin.   4. HTN:  Controlled.   5. Diabetes Mellitus:  Give  1/2 dose Lantus the night before her cath, hold Novolog while NPO, hold Metformin 24 hours before and 48 hours after cath and hold Liraglutide the AM of cath.       Medication Changes: Current medicines are reviewed at length with the patient today.  Concerns regarding medicines are as outlined above.  The following changes have been made:   Discontinued Medications   ASPIRIN 81 MG TABLET    Take 1 tablet (81 mg total) by mouth daily.   Modified Medications   No medications on file   New Prescriptions   No medications on file   Labs/ tests ordered today include:   No orders of the defined types were placed in this encounter.     Disposition:    FU with Dr. Fransico Him 2 weeks post cath.     Signed, Versie Starks, MHS 12/19/2014 11:08 AM    [facility] [place], [place], [place]  [address] Phone: [phone]; Fax: [phone]

## 2014-12-27 ENCOUNTER — Encounter (HOSPITAL_COMMUNITY): Payer: Self-pay | Admitting: Student

## 2014-12-27 ENCOUNTER — Other Ambulatory Visit: Payer: Self-pay

## 2014-12-27 DIAGNOSIS — R931 Abnormal findings on diagnostic imaging of heart and coronary circulation: Secondary | ICD-10-CM | POA: Diagnosis not present

## 2014-12-27 DIAGNOSIS — Z8673 Personal history of transient ischemic attack (TIA), and cerebral infarction without residual deficits: Secondary | ICD-10-CM | POA: Diagnosis not present

## 2014-12-27 DIAGNOSIS — I2582 Chronic total occlusion of coronary artery: Secondary | ICD-10-CM | POA: Diagnosis not present

## 2014-12-27 DIAGNOSIS — Z955 Presence of coronary angioplasty implant and graft: Secondary | ICD-10-CM | POA: Insufficient documentation

## 2014-12-27 DIAGNOSIS — I25119 Atherosclerotic heart disease of native coronary artery with unspecified angina pectoris: Secondary | ICD-10-CM | POA: Diagnosis not present

## 2014-12-27 LAB — BASIC METABOLIC PANEL
Anion gap: 7 (ref 5–15)
BUN: 8 mg/dL (ref 6–20)
CO2: 25 mmol/L (ref 22–32)
Calcium: 8.8 mg/dL — ABNORMAL LOW (ref 8.9–10.3)
Chloride: 104 mmol/L (ref 101–111)
Creatinine, Ser: 1.02 mg/dL — ABNORMAL HIGH (ref 0.44–1.00)
GFR, EST NON AFRICAN AMERICAN: 58 mL/min — AB (ref >60)
Glucose, Bld: 114 mg/dL — ABNORMAL HIGH (ref 65–99)
POTASSIUM: 4.7 mmol/L (ref 3.5–5.1)
SODIUM: 136 mmol/L (ref 135–145)

## 2014-12-27 LAB — CBC
HEMATOCRIT: 32.4 % — AB (ref 36.0–46.0)
Hemoglobin: 9.8 g/dL — ABNORMAL LOW (ref 12.0–15.0)
MCH: 19.8 pg — ABNORMAL LOW (ref 26.0–34.0)
MCHC: 30.2 g/dL (ref 30.0–36.0)
MCV: 65.5 fL — ABNORMAL LOW (ref 78.0–100.0)
PLATELETS: 370 10*3/uL (ref 150–400)
RBC: 4.95 MIL/uL (ref 3.87–5.11)
RDW: 17.8 % — AB (ref 11.5–15.5)
WBC: 8.3 10*3/uL (ref 4.0–10.5)

## 2014-12-27 LAB — GLUCOSE, CAPILLARY: Glucose-Capillary: 160 mg/dL — ABNORMAL HIGH (ref 65–99)

## 2014-12-27 MED ORDER — ASPIRIN 81 MG PO CHEW: 81.0000 mg | CHEWABLE_TABLET | Freq: Every day | ORAL | Status: DC

## 2014-12-27 MED ORDER — DOCUSATE SODIUM 100 MG PO CAPS: 100.0000 mg | ORAL_CAPSULE | Freq: Every day | ORAL | Status: DC

## 2014-12-27 MED ORDER — NITROGLYCERIN 0.4 MG SL SUBL: 0.4000 mg | SUBLINGUAL_TABLET | SUBLINGUAL | Status: DC | PRN

## 2014-12-27 MED ORDER — DOCUSATE SODIUM 100 MG PO CAPS
100.0000 mg | ORAL_CAPSULE | Freq: Every day | ORAL | Status: DC
  Administered 2014-12-27: 100 mg via ORAL
  Filled 2014-12-27: qty 1

## 2014-12-27 NOTE — Progress Notes (Signed)
CARDIAC REHAB PHASE I   PRE:  Rate/Rhythm: 96 SR  BP:  Sitting: 137/62        SaO2: 98 RA  MODE:  Ambulation: 500 ft   POST:  Rate/Rhythm: 103 SR  BP:  Sitting: 120/64         SaO2: 98 RA  Pt ambulated 500 ft on RA, handheld assist, steady gait, tolerated well.  Pt c/o of mild lower back pain, states this is not new for her, denies DOE, denies cp, dizziness, declined rest stop. Completed PCI/stent/CHF education with pt and husband at bedside. Pt and her husband speak [place]. Reviewed risk factors, anti-platelet therapy, stent card, activity restrictions, ntg, exercise, heart healthy diet, carb counting, portion control, CHF booklet and zone tool, daily weights, sodium restrictions and phase 2 cardiac rehab. Pt and husband verbalized understanding. Pt to return for staged PCI at future date, will defer phase 2 cardiac referral until that time. Pt to recliner after walk, call bell within reach.   [id number] Lenna Sciara, RN, BSN 12/27/2014 9:18 AM

## 2014-12-27 NOTE — Discharge Instructions (Signed)

## 2014-12-27 NOTE — Progress Notes (Signed)
Patient Name: Kathleen Holt Date of Encounter: 12/27/2014  Active Problems:   Abnormal nuclear stress test   Coronary artery disease involving native coronary artery of native heart with angina pectoris [facility])    Primary Cardiologist: Dr. Radford Pax Patient Profile: 59 yo female w/ PMH of CAD (s/p 3 DES in 2011), ischemic cardiomyopathy, HLD, and Type 2 DM who had abnormal stress test on 12/12/2014. Referred for cardiac catheterization.  SUBJECTIVE: Denies any chest pain, palpitations, or shortness of breath overnight. Ambulated with cardiac rehab this morning without difficulty.  OBJECTIVE Filed Vitals:   12/26/14 1600 12/26/14 2025 12/27/14 0149 12/27/14 0738  BP: 119/64 118/54 124/66 137/62  Pulse: 81 84 89 92  Temp: 98 F (36.7 C) 97.9 F (36.6 C) 97.4 F (36.3 C) 98 F (36.7 C)  TempSrc: Oral Oral Oral Oral  Resp: 23 17 19 22   Height:      Weight:   187 lb 2.7 oz (84.9 kg)   SpO2: 99% 95% 98% 94%    Intake/Output Summary (Last 24 hours) at 12/27/14 0810 Last data filed at 12/27/14 0759  Gross per 24 hour  Intake  867.6 ml  Output   1100 ml  Net -232.4 ml   Filed Weights   12/26/14 0551 12/27/14 0149  Weight: 180 lb (81.647 kg) 187 lb 2.7 oz (84.9 kg)    PHYSICAL EXAM General: Well developed, well nourished, female in no acute distress. Head: Normocephalic, atraumatic.  Neck: Supple without bruits, JVD not elevated. Lungs:  Resp regular and unlabored, CTA without wheezing or rales. Heart: RRR, S1, S2, no S3, S4, 2/6 SEM; no rub. Abdomen: Soft, non-tender, non-distended with normoactive bowel sounds. No hepatomegaly. No rebound/guarding. No obvious abdominal masses. Extremities: No clubbing, cyanosis, or edema. Distal pedal pulses are 2+ bilaterally. Right radial cath site with minimal ecchymosis. No evidence of hematoma or bruit. Neuro: Alert and oriented X 3. Moves all extremities spontaneously. Psych: Normal affect.   LABS: CBC: Recent Labs   12/27/14 0430  WBC 8.3  HGB 9.8*  HCT 32.4*  MCV 65.5*  PLT 370   INR:No results for input(s): INR in the last 72 hours. Basic Metabolic Panel: Recent Labs  12/27/14 0430  NA 136  K 4.7  CL 104  CO2 25  GLUCOSE 114*  BUN 8  CREATININE 1.02*  CALCIUM 8.8*    TELE:  NSR with rate in 80's - low 100's. Occasional PVC's.      ECG: NSR with non-specific T-wave abnormalities. No acute changes since previous tracing.   Cardiac Catheterization: 12/26/2014  Dist RCA lesion, 90% stenosed. Chronically occluded mid RCA stent with moderate proximal to mid vessel disease as well.  Prox Cx to Mid Cx lesion, 100% stenosed. The lesion was previously treated with a stent (unknown type).  Dist Cx lesion, 90% stenosed. Left to left collaterals feed the distal circ territory.  The RCA was treated with 3 overlapping Synergy drug-eluting stents, 2.25 x 16, 2.5 x 38 and 3.0 x 32. They were all postdilated with a 3.5 mm noncompliant balloon. Post intervention, there is a 0% residual stenosis. The lesion was previously treated with a stent (unknown type).  Continue dual antiplatelet therapy for at least a year without interruption and likely beyond given the number stents she has. She'll be watched overnight in the hospital with planned discharge tomorrow.  We'll plan to bring the patient back for PCI of her CTO of the circumflex at a later time. This does appear amenable  to PCI.  Current Medications:  . aspirin  81 mg Oral Daily  . atorvastatin  20 mg Oral Daily  . carvedilol  3.125 mg Oral BID WC  . clopidogrel  75 mg Oral Q breakfast  . gabapentin  800 mg Oral TID  . insulin aspart  14 Units Subcutaneous TID WC  . insulin glargine  30 Units Subcutaneous Q2200  . Liraglutide  1.2 mg Subcutaneous Daily  . lisinopril  2.5 mg Oral Daily  . [START ON 12/28/2014] metFORMIN  1,000 mg Oral BID WC  . sodium chloride  3 mL Intravenous Q12H      ASSESSMENT AND PLAN:  1. Abnormal nuclear stress  test - history of CAD with 3 DES placed in 2011 while in [place] (to RCA and Circumflex).  - Abnormal stress test on 12/12/2014. - Cath on 12/26/2014 showed 90% stenosis in the Dist RCA lesion and 100% stenosis in the Prox to Mid Cx. The RCA was treated with 3 overlapping Synergy drug-eluting stents, 2.25 x 16, 2.5 x 38 and 3.0 x 32. Continued dual antiplatelet therapy for at least a year without interruption was recommended. She will need to come back for PCI of her CTO of the circumflex at a later time. - continue ASA, statin, Plavix, BB, and ACE-I.  2. Type 2 DM - SSI while admitted - resume Metformin 48 hours following cath.   Signed, Erma Heritage , PA-C 8:10 AM 12/27/2014 Pager: [phone]

## 2014-12-27 NOTE — Discharge Summary (Signed)
CARDIOLOGY DISCHARGE SUMMARY   Patient ID: Kathleen Holt MRN: [id number] DOB/AGE: [id number] y.o.  Admit date: 12/26/2014 Discharge date: 12/27/2014  PCP: Lance Bosch, NP Primary Cardiologist: Dr. Radford Pax  Primary Discharge Diagnosis: Abnormal Stress Test Secondary Discharge Diagnosis: Coronary artery disease involving native coronary artery of native heart with angina pectoris [facility])  Consults: None  Procedures: Left Heart Catheterization, Coronary Angiography, Coronary Stent Intervention  Hospital Course: Kathleen Holt is a 59 y.o. female with past medical history of CAD (s/p 3 DES in 2011), ischemic cardiomyopathy, HLD, and Type 2 DM who had an abnormal stress test on 12/12/2014 due to her reduced EF of 39%. Due to her intermediate risk stress test and worsening chest discomfort and dyspnea with exertion, a cardiac catheterization was recommended.  She presented to [facility] on 12/26/2014 for the procedure. The risks and benefits of the procedure were discussed thoroughly with the patient and she agreed to proceed. Her cath showed 90% stenosis in the Distal RCA and 100% stenosis in the Prox to Mid Cx. The RCA was treated with 3 overlapping Synergy drug-eluting stents, 2.25 x 16, 2.5 x 38 and 3.0 x 32. Continued dual antiplatelet therapy for at least a year without interruption was recommended. She will need to come back for PCI of her CTO of the circumflex at a later time.  She denied any chest pain or shortness of breath overnight. Her vitals and lab results were reviewed and appeared stable. Her right radial cath site had minimal ecchymosis with no evidence of infection or a hematoma. She ambulated over 553f with cardiac rehab without difficulty.  The patient was last examined by Dr. HDebara Pickettand deemed stable for discharge. She was instructed to continue taking ASA 890m Atorvastatin, Plavix, Carvedilol, and Lisinopril. She will resume Metformin 48 hours following  her catheterization. Cardiology follow-up has been arranged on 01/18/2015 with LaCecilie KicksNP.  Labs:   Lab Results  Component Value Date   WBC 8.3 12/27/2014   HGB 9.8* 12/27/2014   HCT 32.4* 12/27/2014   MCV 65.5* 12/27/2014   PLT 370 12/27/2014     Recent Labs Lab 12/27/14 0430  NA 136  K 4.7  CL 104  CO2 25  BUN 8  CREATININE 1.02*  CALCIUM 8.8*  GLUCOSE 114*   No results for input(s): CKTOTAL, CKMB, CKMBINDEX, TROPONINI in the last 72 hours. Lipid Panel     Component Value Date/Time   CHOL 100 07/20/2014 0934   TRIG 138.0 07/20/2014 0934   HDL 36.90* 07/20/2014 0934   CHOLHDL 3 07/20/2014 0934   VLDL 27.6 07/20/2014 0934   LDLCALC 36 07/20/2014 0934   Cardiac Cath: 12/26/2014     Dist RCA lesion, 90% stenosed. Chronically occluded mid RCA stent with moderate proximal to mid vessel disease as well.  Prox Cx to Mid Cx lesion, 100% stenosed. The lesion was previously treated with a stent (unknown type).  Dist Cx lesion, 90% stenosed. Left to left collaterals feed the distal circ territory.  The RCA was treated with 3 overlapping Synergy drug-eluting stents, 2.25 x 16, 2.5 x 38 and 3.0 x 32. They were all postdilated with a 3.5 mm noncompliant balloon. Post intervention, there is a 0% residual stenosis. The lesion was previously treated with a stent (unknown type).  Continue dual antiplatelet therapy for at least a year without interruption and likely beyond given the number stents she has. She'll be watched overnight in the hospital with planned discharge tomorrow.  We'll plan to bring the patient back for PCI of her CTO of the circumflex at a later time. This does appear amenable to PCI.   EKG: NSR, rate in 80's. Nonspecific T-wave abnormalities. No acute changes.   FOLLOW UP PLANS AND APPOINTMENTS No Known Allergies   Medication List    STOP taking these medications        aspirin 325 MG EC tablet  Replaced by:  aspirin 81 MG chewable tablet        TAKE these medications        ACCU-CHEK AVIVA PLUS W/DEVICE Kit  Use to check blood sugar 2 times per day dx code E11.40     ACCU-CHEK SOFTCLIX LANCETS lancets  Use as instructed to check blood sugar 2 times per day dx code E11.40     aspirin 81 MG chewable tablet  Chew 1 tablet (81 mg total) by mouth daily.     atorvastatin 20 MG tablet  Commonly known as:  LIPITOR  Take 1 tablet (20 mg total) by mouth daily.     carvedilol 3.125 MG tablet  Commonly known as:  COREG  Take 1 tablet (3.125 mg total) by mouth 2 (two) times daily with a meal.     clopidogrel 75 MG tablet  Commonly known as:  PLAVIX  Take 1 tablet (75 mg total) by mouth daily.     docusate sodium 100 MG capsule  Commonly known as:  COLACE  Take 1 capsule (100 mg total) by mouth daily.     ferrous sulfate 325 (65 FE) MG tablet  Take 1 tablet (325 mg total) by mouth 2 (two) times daily with a meal.     gabapentin 400 MG capsule  Commonly known as:  NEURONTIN  Take 2 capsules (800 mg total) by mouth 3 (three) times daily.     glucose blood test strip  Commonly known as:  ACCU-CHEK AVIVA PLUS  Use as instructed to check blood sugar 2 times per day dx code E11.40     insulin aspart 100 UNIT/ML FlexPen  Commonly known as:  NOVOLOG FLEXPEN  Inject 14 Units into the skin 3 (three) times daily with meals.     Insulin Glargine 100 UNIT/ML Solostar Pen  Commonly known as:  LANTUS SOLOSTAR  Inject 30 Units into the skin daily at 10 pm.     Insulin Pen Needle 31G X 5 MM Misc  Use 5 per day to inject insulin     Liraglutide 18 MG/3ML Sopn  Inject 0.2 mLs (1.2 mg total) into the skin daily.     lisinopril 2.5 MG tablet  Commonly known as:  PRINIVIL,ZESTRIL  Take 1 tablet (2.5 mg total) by mouth daily.     metFORMIN 1000 MG tablet  Commonly known as:  GLUCOPHAGE  Take 1 tablet (1,000 mg total) by mouth 2 (two) times daily with a meal.  Notes to Patient:  [place] (START  BACK WITH EVENING DOSE ON 12/28/2014).     nitroGLYCERIN 0.4 MG SL tablet  Commonly known as:  NITROSTAT  Place 1 tablet (0.4 mg total) under the tongue every 5 (five) minutes x 3 doses as needed for chest pain.     nitroGLYCERIN 0.4 MG SL tablet  Commonly known as:  NITROSTAT  Place 1 tablet (0.4 mg total) under the tongue every 5 (five) minutes x 3 doses as needed for chest pain.     Polyethyl Glycol-Propyl Glycol 0.4-0.3 % Soln  1  drop in each eye every 3 hours as needed.     Vitamin D (Ergocalciferol) 50000 UNITS Caps capsule  Commonly known as:  DRISDOL  Take 1 capsule (50,000 Units total) by mouth every 7 (seven) days. For 8 weeks  Notes to Patient:  As scheduled          Follow-up Information    Follow up with [facility], NP On 01/18/2015.   Specialties:  Cardiology, Radiology   Why:  Cardiology Hospital Follow-Up on 01/18/2015 at 9:00AM.    Contact information:   [address] [place] [address] [phone]       BRING ALL MEDICATIONS WITH YOU TO FOLLOW UP APPOINTMENTS  Time spent with patient to include physician time: 35 minutes  Signed: Erma Heritage, PA 12/27/2014, 1:06 PM Co-Sign MD

## 2015-01-11 ENCOUNTER — Other Ambulatory Visit: Payer: Self-pay | Admitting: Internal Medicine

## 2015-01-16 ENCOUNTER — Other Ambulatory Visit: Payer: Self-pay | Admitting: Family Medicine

## 2015-01-17 ENCOUNTER — Other Ambulatory Visit (INDEPENDENT_AMBULATORY_CARE_PROVIDER_SITE_OTHER): Payer: Medicaid Other

## 2015-01-17 DIAGNOSIS — IMO0002 Reserved for concepts with insufficient information to code with codable children: Secondary | ICD-10-CM

## 2015-01-17 DIAGNOSIS — E1165 Type 2 diabetes mellitus with hyperglycemia: Secondary | ICD-10-CM

## 2015-01-17 LAB — BASIC METABOLIC PANEL
BUN: 13 mg/dL (ref 6–23)
CALCIUM: 9.3 mg/dL (ref 8.4–10.5)
CO2: 29 mEq/L (ref 19–32)
Chloride: 104 mEq/L (ref 96–112)
Creatinine, Ser: 0.89 mg/dL (ref 0.40–1.20)
GFR: 68.37 mL/min (ref >60.00)
GLUCOSE: 68 mg/dL — AB (ref 70–99)
Potassium: 4.9 mEq/L (ref 3.5–5.1)
SODIUM: 139 meq/L (ref 135–145)

## 2015-01-17 LAB — LIPID PANEL
Cholesterol: 119 mg/dL (ref 0–200)
HDL: 35.4 mg/dL — AB (ref >39.00)
LDL Cholesterol: 59 mg/dL (ref 0–99)
NONHDL: 83.55
Total CHOL/HDL Ratio: 3
Triglycerides: 124 mg/dL (ref 0.0–149.0)
VLDL: 24.8 mg/dL (ref 0.0–40.0)

## 2015-01-17 LAB — HEMOGLOBIN A1C: Hgb A1c MFr Bld: 7.4 % — ABNORMAL HIGH (ref 4.6–6.5)

## 2015-01-18 ENCOUNTER — Encounter: Payer: Self-pay | Admitting: Cardiology

## 2015-01-18 ENCOUNTER — Other Ambulatory Visit: Payer: Self-pay | Admitting: Family Medicine

## 2015-01-18 ENCOUNTER — Ambulatory Visit (INDEPENDENT_AMBULATORY_CARE_PROVIDER_SITE_OTHER): Payer: Medicaid Other | Admitting: Cardiology

## 2015-01-18 VITALS — BP 120/60 | HR 89 | Ht 60.0 in | Wt 189.0 lb

## 2015-01-18 DIAGNOSIS — I10 Essential (primary) hypertension: Secondary | ICD-10-CM

## 2015-01-18 DIAGNOSIS — E785 Hyperlipidemia, unspecified: Secondary | ICD-10-CM | POA: Diagnosis not present

## 2015-01-18 DIAGNOSIS — I25119 Atherosclerotic heart disease of native coronary artery with unspecified angina pectoris: Secondary | ICD-10-CM

## 2015-01-18 DIAGNOSIS — E1142 Type 2 diabetes mellitus with diabetic polyneuropathy: Secondary | ICD-10-CM

## 2015-01-18 MED ORDER — ASPIRIN 81 MG PO TABS: 81.0000 mg | ORAL_TABLET | Freq: Every day | ORAL | Status: DC

## 2015-01-18 NOTE — Patient Instructions (Signed)
Medication Instructions:  Your physician has recommended you make the following change in your medication:  1. Decrease Asprin ( 81 mg ) daily   Labwork: Your physician recommends that you return for lab work on January 11 for: bmet/cbc/ptt/pt/inr   Testing/Procedures:   Follow-Up:   Any Other Special Instructions Will Be Listed Below (If Applicable).   Your provider has recommended a cardiac catherization  You are scheduled for a cardiac catheterization on January18 with Dr. Irish Lack  or associate.  Go to [facility] 2nd Floor Short Stay on Wednesday, January 18 at 7:00 am.  Enter thru the [place] entrance A No food or drink after midnight on Tuesday, January 17. You may take your medications with a sip of water on the day of your procedure.   Coronary Angiogram A coronary angiogram, also called coronary angiography, is an X-ray procedure used to look at the arteries in the heart. In this procedure, a dye (contrast dye) is injected through a long, hollow tube (catheter). The catheter is about the size of a piece of cooked spaghetti and is inserted through your groin, wrist, or arm. The dye is injected into each artery, and X-rays are then taken to show if there is a blockage in the arteries of your heart.  LET [facility] CARE PROVIDER KNOW ABOUT:  Any allergies you have, including allergies to shellfish or contrast dye.   All medicines you are taking, including vitamins, herbs, eye drops, creams, and over-the-counter medicines.   Previous problems you or members of your family have had with the use of anesthetics.   Any blood disorders you have.   Previous surgeries you have had.  History of kidney problems or failure.   Other medical conditions you have.  RISKS AND COMPLICATIONS  Generally, a coronary angiogram is a safe procedure. However, about 1 person out of 1000 can have problems that may include:  Allergic reaction to the dye.  Bleeding/bruising  from the access site or other locations.  Kidney injury, especially in people with impaired kidney function.  Stroke (rare).  Heart attack (rare).  Irregular rhythms (rare)  Death (rare)  BEFORE THE PROCEDURE   Do not eat or drink anything after midnight the night before the procedure or as directed by your health care provider.   Ask your health care provider about changing or stopping your regular medicines. This is especially important if you are taking diabetes medicines or blood thinners.  PROCEDURE  You may be given a medicine to help you relax (sedative) before the procedure. This medicine is given through an intravenous (IV) access tube that is inserted into one of your veins.   The area where the catheter will be inserted will be washed and shaved. This is usually done in the groin but may be done in the fold of your arm (near your elbow) or in the wrist.   A medicine will be given to numb the area where the catheter will be inserted (local anesthetic).   The health care provider will insert the catheter into an artery. The catheter will be guided by using a special type of X-ray (fluoroscopy) of the blood vessel being examined.   A special dye will then be injected into the catheter, and X-rays will be taken. The dye will help to show where any narrowing or blockages are located in the heart arteries.    AFTER THE PROCEDURE   If the procedure is done through the leg, you will be kept in  bed lying flat for several hours. You will be instructed to not bend or cross your legs.  The insertion site will be checked frequently.   The pulse in your feet or wrist will be checked frequently.   Additional blood tests, X-rays, and an electrocardiogram may be done.     If you need a refill on your cardiac medications before your next appointment, please call your pharmacy.

## 2015-01-18 NOTE — Progress Notes (Signed)
Cardiology Office Note   Date:  01/18/2015   ID:  Kathleen Holt, DOB 09-03-1955, MRN [id number]  PCP:  Minerva Ends, MD  Cardiologist:  Dr. Radford Pax    Chief Complaint  Patient presents with  . Coronary Artery Disease    no chest pain      History of Present Illness: Kathleen Holt is a 59 y.o. female who presents for procedure follow up.  She had abnormal nuc study and was arranged for cath finding 90% stenosisi in dRCA and 100% in prox to mlcx.  RCA treated with 3 overlapping synergy DES stents.  Plan for CTO of LCX scheduled for 02/07/14.    Today  No chest pain no SOB.  Is walking 30 min per day. Her husband stated she eats heart healthy diet and her glucose is stable.   She understands about need for further intervention- with CTO to LCX.  This is scheduled for 02/07/14.  She will need an interpreter and request Audiological scientist the language is [place]. [place].  She is on ASA 325 and with her Iron def. Anemia we will decrease to 81 mg.    i reviewed her recent labs and lipids are great.     Past Medical History  Diagnosis Date  . Coronary artery disease     a. s/p stenting 11/2009 [facility] b. 12/26/2014: Dist RCA 90% stenosed and 100% stenosis of the Prox to Mid-Cx. 3 DES placed to RCA.   Marland Kitchen Hyperlipidemia   . Ejection fraction      EF 45-50%,  echo, February 10, 2012, akinesis posterior lateral wall, diastolic dysfunction, mild mitral regurgitation,  . Mitral regurgitation     Mild, echo, January, 2014  . Type II diabetes mellitus ([place])   . Diabetic neuropathy ([place])   . Myocardial infarction ([place]) 2011  . Chronic lower back pain   . Chronic constipation     Past Surgical History  Procedure Laterality Date  . Eye surgery    . Vaginal hysterectomy    . Cataract extraction w/ intraocular lens  implant, bilateral    . Coronary angioplasty with stent placement  2011; 12/26/2014  . Cardiac catheterization N/A 12/26/2014   Procedure: Left Heart Cath and Coronary Angiography;  Surgeon: Jettie Booze, MD;  Location: [place] [facility];  Service: Cardiovascular;  Laterality: N/A;  . Cardiac catheterization N/A 12/26/2014    Procedure: Coronary Stent Intervention;  Surgeon: Jettie Booze, MD;  Location: [place] [facility];  Service: Cardiovascular; 3 overlapping Synergy drug-eluting stents, 2.25 x 16, 2.5 x 38 and 3.0 x 32 to the RCA     Current Outpatient Prescriptions  Medication Sig Dispense Refill  . ACCU-CHEK SOFTCLIX LANCETS lancets Use as instructed to check blood sugar 2 times per day dx code E11.40 100 each 3  . aspirin 325 MG tablet Take 325 mg by mouth daily.    Marland Kitchen atorvastatin (LIPITOR) 20 MG tablet Take 1 tablet (20 mg total) by mouth daily. 30 tablet 5  . Blood Glucose Monitoring Suppl (ACCU-CHEK AVIVA PLUS) W/DEVICE KIT Use to check blood sugar 2 times per day dx code E11.40 1 kit 1  . carvedilol (COREG) 3.125 MG tablet Take 1 tablet (3.125 mg total) by mouth 2 (two) times daily with a meal. 60 tablet 5  . clopidogrel (PLAVIX) 75 MG tablet Take 1 tablet (75 mg total) by mouth daily. 90 tablet 3  . docusate sodium (COLACE) 100 MG capsule Take 1  capsule (100 mg total) by mouth daily. 10 capsule 0  . ferrous sulfate 325 (65 FE) MG tablet Take 1 tablet (325 mg total) by mouth 2 (two) times daily with a meal. 60 tablet 3  . gabapentin (NEURONTIN) 400 MG capsule Take 2 capsules (800 mg total) by mouth 3 (three) times daily. 240 capsule 5  . glucose blood (ACCU-CHEK AVIVA PLUS) test strip Use as instructed to check blood sugar 2 times per day dx code E11.40 100 each 3  . insulin aspart (NOVOLOG FLEXPEN) 100 UNIT/ML FlexPen Inject 14 Units into the skin 3 (three) times daily with meals. 15 pen 2  . Insulin Glargine (LANTUS SOLOSTAR) 100 UNIT/ML Solostar Pen Inject 30 Units into the skin daily at 10 pm. 45 mL 1  . Insulin Pen Needle 31G X 5 MM MISC Use 5 per day to inject insulin 150 each 2  .  Liraglutide 18 MG/3ML SOPN Inject 0.2 mLs (1.2 mg total) into the skin daily. (Patient taking differently: Inject 9 mg into the skin daily. VICTOZA) 2 pen 5  . lisinopril (PRINIVIL,ZESTRIL) 2.5 MG tablet Take 1 tablet (2.5 mg total) by mouth daily. 30 tablet 5  . metFORMIN (GLUCOPHAGE) 1000 MG tablet Take 1 tablet (1,000 mg total) by mouth 2 (two) times daily with a meal. 60 tablet 5  . nitroGLYCERIN (NITROSTAT) 0.4 MG SL tablet Place 1 tablet (0.4 mg total) under the tongue every 5 (five) minutes x 3 doses as needed for chest pain. 25 tablet 3  . Polyethyl Glycol-Propyl Glycol 0.4-0.3 % SOLN 1 drop in each eye every 3 hours as needed. (Patient taking differently: 1 drop in each eye every 3 hours as needed for dry eyes.) 10 mL 12  . Vitamin D, Ergocalciferol, (DRISDOL) 50000 UNITS CAPS capsule Take 1 capsule (50,000 Units total) by mouth every 7 (seven) days. For 8 weeks 8 capsule 0   No current facility-administered medications for this visit.    Allergies:   Review of patient's allergies indicates no known allergies.    Social History:  The patient  reports that she has never smoked. She has never used smokeless tobacco. She reports that she does not drink alcohol or use illicit drugs.   Family History:  The patient's family history includes CAD in her brother and father; Cancer in her father; Diabetes in her father; Heart attack in her brother and father; Hypertension in her father. There is no history of Stroke.    ROS:  General:no colds or fevers, no weight changes Skin:no rashes or ulcers HEENT:no blurred vision, no congestion CV:see HPI PUL:see HPI GI:no diarrhea constipation or melena, no indigestion GU:no hematuria, no dysuria MS:no joint pain, no claudication Neuro:no syncope, no lightheadedness Endo:+ diabetes-stable, no thyroid disease  Wt Readings from Last 3 Encounters:  01/18/15 189 lb (85.73 kg)  12/27/14 187 lb 2.7 oz (84.9 kg)  12/19/14 188 lb 6.4 oz (85.458 kg)       PHYSICAL EXAM: VS:  BP 120/60 mmHg  Pulse 89  Ht 5' (1.524 m)  Wt 189 lb (85.73 kg)  BMI 36.91 kg/m2  SpO2 98% , BMI Body mass index is 36.91 kg/(m^2). General:Pleasant affect, NAD Skin:Warm and dry, brisk capillary refill HEENT:normocephalic, sclera clear, mucus membranes moist Neck:supple, no JVD, no bruits  Heart:S1S2 RRR without murmur, gallup, rub or click Lungs:clear without rales, rhonchi, or wheezes MBW:GYKZ, non tender, + BS, do not palpate liver spleen or masses Ext:no lower ext edema, 2+ pedal pulses, 2+ radial pulses-rt wrist  cath site without hematoma Neuro:alert and oriented X 3, MAE, follows commands, + facial symmetry    EKG:  EKG is NOT ordered today.    Recent Labs: 07/20/2014: ALT 18; TSH 3.02 12/27/2014: Hemoglobin 9.8*; Platelets 370 01/17/2015: BUN 13; Creatinine, Ser 0.89; Potassium 4.9; Sodium 139    Lipid Panel    Component Value Date/Time   CHOL 119 01/17/2015 0912   TRIG 124.0 01/17/2015 0912   HDL 35.40* 01/17/2015 0912   CHOLHDL 3 01/17/2015 0912   VLDL 24.8 01/17/2015 0912   LDLCALC 59 01/17/2015 0912       Other studies Reviewed: Additional studies/ records that were reviewed today include: previous cath. Discharge summary.  CATH: Conclusion     Dist RCA lesion, 90% stenosed. Chronically occluded mid RCA stent with moderate proximal to mid vessel disease as well.  Prox Cx to Mid Cx lesion, 100% stenosed. The lesion was previously treated with a stent (unknown type).  Dist Cx lesion, 90% stenosed. Left to left collaterals feed the distal circ territory.  The RCA was treated with 3 overlapping Synergy drug-eluting stents, 2.25 x 16, 2.5 x 38 and 3.0 x 32. They were all postdilated with a 3.5 mm noncompliant balloon. Post intervention, there is a 0% residual stenosis. The lesion was previously treated with a stent (unknown type).  Continue dual antiplatelet therapy for at least a year without interruption and likely beyond given  the number stents she has. She'll be watched overnight in the hospital with planned discharge tomorrow.  We'll plan to bring the patient back for PCI of her CTO of the circumflex at a later time. This does appear amenable to PCI.     ASSESSMENT AND PLAN:  1.  CAD with recent angina and positive stress test.  Cardiac cath with stenosis of dRCA with 3 overlapping DES.  Also with LCX occl with plan for CTO of this vessel 02/07/14.  Doing well today, no chest pain. -will need interpreter for [place] dialect  2. HTN controlled  3. Hyperlipidemia controlled  4. DM-2 stable.   Current medicines are reviewed with the patient today.  The patient Has no concerns regarding medicines.  The following changes have been made:  See above Labs/ tests ordered today include:see above  Disposition:   FU:  see above  Lennie Muckle, NP  01/18/2015 9:32 AM    [facility] [place], [place], [place], [place] Phone: [phone]; Fax: [phone]

## 2015-01-19 ENCOUNTER — Other Ambulatory Visit (INDEPENDENT_AMBULATORY_CARE_PROVIDER_SITE_OTHER): Payer: Medicaid Other

## 2015-01-19 ENCOUNTER — Ambulatory Visit (INDEPENDENT_AMBULATORY_CARE_PROVIDER_SITE_OTHER): Payer: Medicaid Other | Admitting: Endocrinology

## 2015-01-19 ENCOUNTER — Encounter: Payer: Self-pay | Admitting: Endocrinology

## 2015-01-19 VITALS — BP 118/62 | HR 89 | Temp 98.0°F | Resp 14 | Ht 60.0 in | Wt 190.0 lb

## 2015-01-19 DIAGNOSIS — D649 Anemia, unspecified: Secondary | ICD-10-CM

## 2015-01-19 DIAGNOSIS — Z794 Long term (current) use of insulin: Secondary | ICD-10-CM | POA: Diagnosis not present

## 2015-01-19 DIAGNOSIS — Z23 Encounter for immunization: Secondary | ICD-10-CM

## 2015-01-19 DIAGNOSIS — E1165 Type 2 diabetes mellitus with hyperglycemia: Secondary | ICD-10-CM

## 2015-01-19 LAB — IBC PANEL
Iron: 22 ug/dL — ABNORMAL LOW (ref 42–145)
SATURATION RATIOS: 4.6 % — AB (ref 20.0–50.0)
Transferrin: 339 mg/dL (ref 212.0–360.0)

## 2015-01-19 LAB — VITAMIN B12: VITAMIN B 12: 131 pg/mL — AB (ref 211–911)

## 2015-01-19 NOTE — Patient Instructions (Signed)
Check blood sugars on waking up 3  times a week Also check blood sugars about 2 hours after a meal and do this after different meals by rotation  Recommended blood sugar levels on waking up is 90-130 and about 2 hours after meal is 130-160  Please bring your blood sugar monitor to each visit, thank you  Lantus 28 units  Novolog 12-16 units depending on amount of food or starch

## 2015-01-19 NOTE — Progress Notes (Signed)
Patient ID: Kathleen Holt, female   DOB: 21-Jan-1956, 59 y.o.   MRN: [id number]    Reason for Appointment: Follow-up of type 2 Diabetes  History of Present Illness:          Diagnosis: Type 2 diabetes mellitus, date of diagnosis: 2005        Past history: The diabetes was diagnosed about 10 years ago and she was probably treated with metformin and other oral agents for at least 6 years. Previously she had tried metformin, Januvia and Amaryl When she was admitted for coronary disease in 2011 in [place] she was switched to insulin and oral agents. Not clear how her control has been in the past She has been on Lantus and NovoLog insulin since 2011 She also had tried [place] with her insulin for sometime but not clear if it was helping her  Because of her poor control and persistently high A1c of 10.1% in 04/2013 her metformin was increased to maximum dose. Also she was started on Victoza but she was unable to tolerate the 1.2 mg dosage since she had decreased appetite and abdominal distress  Recent history:   INSULIN regimen is described as:  Lantus 30 at am, NovoLog 10-14 units ac tid  Her A1c continues to be over 7% but slightly better at 7.4 now  She did not tolerate 1.2 mg Victoza previously and although she was told to dial 5 clicks beyond 0.6 she is dialing only 3 clicks and this tends to cause decreased appetite Currently  on basal bolus insulin regimen with Lantus insulin in the mornings On her last visit she was told to adjust her mealtime doses based on what she is eating but she is mostly taking a fixed dose  Current blood sugar patterns and problems identified:  She is checking blood sugars primarily in the morning and these are fairly normal with only occasional readings over 100 recently  Occasionally will check blood sugars at bedtime and these are quite variable at times and she does not know why  at lunchtime only  Although she has had occasional high  readings around lunchtime they are fairly consistently near normal recently and averaging about 125  Fasting glucose in the lab was normal at 100  She cannot explain a high reading of 258 during the night  She has difficulty losing weight even with Victoza  She is empirically adjusting her mealtime doses between 8-14 units but not clear if this is working as she is not doing any postprandial readings       Oral hypoglycemic drugs the patient is taking are: Metformin 2 g daily      Side effects from medications have been: abdominal distress and anorexia with 1.2 Victoza  Glucose monitoring:  done once daily        Glucometer: Accu-Chek  Blood Glucose readings from Download show  Mean values apply above for all meters except median for One Touch  PRE-MEAL Fasting Lunch  dinner  Bedtime Overall  Glucose range:  75-179   ?    108-266    Mean/median:         Glycemic control  Lab Results  Component Value Date   HGBA1C 7.4* 01/17/2015   HGBA1C 7.8* 10/17/2014   HGBA1C 7.60 10/11/2014   Lab Results  Component Value Date   MICROALBUR 2.7* 04/13/2014   LDLCALC 59 01/17/2015   CREATININE 0.89 01/17/2015    Self-care: The diet that the patient has been following is:  None, usually vegetarian      Exercise: a little walking around her apartment or going up steps         Dietician visit:  none              Compliance with the medical regimen: Fair  Weight history:  Wt Readings from Last 3 Encounters:  01/19/15 190 lb (86.183 kg)  01/18/15 189 lb (85.73 kg)  12/27/14 187 lb 2.7 oz (84.9 kg)     Lab on 01/17/2015  Component Date Value Ref Range Status  . Hgb A1c MFr Bld 01/17/2015 7.4* 4.6 - 6.5 % Final   Glycemic Control Guidelines for People with Diabetes:Non Diabetic:  <6%Goal of Therapy: <7%Additional Action Suggested:  >8%   . Sodium 01/17/2015 139  135 - 145 mEq/L Final  . Potassium 01/17/2015 4.9  3.5 - 5.1 mEq/L Final  . Chloride 01/17/2015 104  96 - 112 mEq/L Final   . CO2 01/17/2015 29  19 - 32 mEq/L Final  . Glucose, Bld 01/17/2015 68* 70 - 99 mg/dL Final  . BUN 01/17/2015 13  6 - 23 mg/dL Final  . Creatinine, Ser 01/17/2015 0.89  0.40 - 1.20 mg/dL Final  . Calcium 01/17/2015 9.3  8.4 - 10.5 mg/dL Final  . GFR 01/17/2015 68.37  >60.00 mL/min Final  . Cholesterol 01/17/2015 119  0 - 200 mg/dL Final   ATP III Classification       Desirable:  < 200 mg/dL               Borderline High:  200 - 239 mg/dL          High:  > = 240 mg/dL  . Triglycerides 01/17/2015 124.0  0.0 - 149.0 mg/dL Final   Normal:  <150 mg/dLBorderline High:  150 - 199 mg/dL  . HDL 01/17/2015 35.40* >39.00 mg/dL Final  . VLDL 01/17/2015 24.8  0.0 - 40.0 mg/dL Final  . LDL Cholesterol 01/17/2015 59  0 - 99 mg/dL Final  . Total CHOL/HDL Ratio 01/17/2015 3   Final                  Men          Women1/2 Average Risk     3.4          3.3Average Risk          5.0          4.42X Average Risk          9.6          7.13X Average Risk          15.0          11.0                      . NonHDL 01/17/2015 83.55   Final   NOTE:  Non-HDL goal should be 30 mg/dL higher than patient's LDL goal (i.e. LDL goal of < 70 mg/dL, would have non-HDL goal of < 100 mg/dL)      Medication List       This list is accurate as of: 01/19/15 11:01 AM.  Always use your most recent med list.               ACCU-CHEK AVIVA PLUS w/Device Kit  Use to check blood sugar 2 times per day dx code E11.40     ACCU-CHEK SOFTCLIX LANCETS lancets  Use as instructed to check blood sugar 2  times per day dx code E11.40     aspirin 81 MG tablet  Take 1 tablet (81 mg total) by mouth daily.     atorvastatin 20 MG tablet  Commonly known as:  LIPITOR  Take 1 tablet (20 mg total) by mouth daily.     carvedilol 3.125 MG tablet  Commonly known as:  COREG  Take 1 tablet (3.125 mg total) by mouth 2 (two) times daily with a meal.     clopidogrel 75 MG tablet  Commonly known as:  PLAVIX  Take 1 tablet (75 mg total) by mouth  daily.     docusate sodium 100 MG capsule  Commonly known as:  COLACE  Take 1 capsule (100 mg total) by mouth daily.     ferrous sulfate 325 (65 FE) MG tablet  Take 1 tablet (325 mg total) by mouth 2 (two) times daily with a meal.     gabapentin 400 MG capsule  Commonly known as:  NEURONTIN  Take 2 capsules (800 mg total) by mouth 3 (three) times daily.     glucose blood test strip  Commonly known as:  ACCU-CHEK AVIVA PLUS  Use as instructed to check blood sugar 2 times per day dx code E11.40     insulin aspart 100 UNIT/ML FlexPen  Commonly known as:  NOVOLOG FLEXPEN  Inject 14 Units into the skin 3 (three) times daily with meals.     Insulin Glargine 100 UNIT/ML Solostar Pen  Commonly known as:  LANTUS SOLOSTAR  Inject 30 Units into the skin daily at 10 pm.     Insulin Pen Needle 31G X 5 MM Misc  Use 5 per day to inject insulin     lisinopril 2.5 MG tablet  Commonly known as:  PRINIVIL,ZESTRIL  Take 1 tablet (2.5 mg total) by mouth daily.     metFORMIN 1000 MG tablet  Commonly known as:  GLUCOPHAGE  Take 1 tablet (1,000 mg total) by mouth 2 (two) times daily with a meal.     nitroGLYCERIN 0.4 MG SL tablet  Commonly known as:  NITROSTAT  Place 1 tablet (0.4 mg total) under the tongue every 5 (five) minutes x 3 doses as needed for chest pain.     Polyethyl Glycol-Propyl Glycol 0.4-0.3 % Soln  Apply 1 drop to eye every 3 (three) hours as needed (FOR DRYNESS).     VICTOZA 18 MG/3ML Sopn  Generic drug:  Liraglutide  Inject 9 mLs into the skin daily.     Vitamin D (Ergocalciferol) 50000 units Caps capsule  Commonly known as:  DRISDOL  Take 1 capsule (50,000 Units total) by mouth every 7 (seven) days. For 8 weeks        Allergies: No Known Allergies  Past Medical History  Diagnosis Date  . Coronary artery disease     a. s/p stenting 11/2009 [facility] b. 12/26/2014: Dist RCA 90% stenosed and 100% stenosis of the Prox to Mid-Cx. 3 DES  placed to RCA.   Marland Kitchen Hyperlipidemia   . Ejection fraction      EF 45-50%,  echo, February 10, 2012, akinesis posterior lateral wall, diastolic dysfunction, mild mitral regurgitation,  . Mitral regurgitation     Mild, echo, January, 2014  . Type II diabetes mellitus ([place])   . Diabetic neuropathy ([place])   . Myocardial infarction ([place]) 2011  . Chronic lower back pain   . Chronic constipation     Past Surgical History  Procedure Laterality Date  .  Eye surgery    . Vaginal hysterectomy    . Cataract extraction w/ intraocular lens  implant, bilateral    . Coronary angioplasty with stent placement  2011; 12/26/2014  . Cardiac catheterization N/A 12/26/2014    Procedure: Left Heart Cath and Coronary Angiography;  Surgeon: Jettie Booze, MD;  Location: [place] [facility];  Service: Cardiovascular;  Laterality: N/A;  . Cardiac catheterization N/A 12/26/2014    Procedure: Coronary Stent Intervention;  Surgeon: Jettie Booze, MD;  Location: [place] [facility];  Service: Cardiovascular; 3 overlapping Synergy drug-eluting stents, 2.25 x 16, 2.5 x 38 and 3.0 x 32 to the RCA    Family History  Problem Relation Age of Onset  . CAD Father     MI at age 15  . Diabetes Father   . Hypertension Father   . Cancer Father   . CAD Brother   . Heart attack Father   . Heart attack Brother   . Stroke Neg Hx     Social History:  reports that she has never smoked. She has never used smokeless tobacco. She reports that she does not drink alcohol or use illicit drugs.    Review of Systems   Eye exam: Last in  3/16  She complains of shortness of breath on exertion and has been significantly anemic for the last 3 months.  She thinks she was told to stop taking iron tablets and is having some constipation issues, has not discussed with PCP  Lab Results  Component Value Date   WBC 8.3 12/27/2014   HGB 9.8* 12/27/2014   HCT 32.4* 12/27/2014   MCV 65.5* 12/27/2014   PLT 370 12/27/2014         Lipids: Has mixed dyslipidemia. On Crestor previously but now is on Lipitor 20 mg from her PCP, does have known coronary artery disease      Lab Results  Component Value Date   CHOL 119 01/17/2015   HDL 35.40* 01/17/2015   LDLCALC 59 01/17/2015   TRIG 124.0 01/17/2015   CHOLHDL 3 01/17/2015        She has a long-standing history of Numbness, tingling in her feet treated adequately with gabapentin   Has history of vitamin D deficiency  Physical Examination:  BP 118/62 mmHg  Pulse 89  Temp(Src) 98 F (36.7 C)  Resp 14  Ht 5' (1.524 m)  Wt 190 lb (86.183 kg)  BMI 37.11 kg/m2  SpO2 97%     No edema present  ASSESSMENT:  Diabetes type 2, uncontrolled with marked obesity and BMI 37 See history of present illness for detailed discussion of his current management, blood sugar patterns and problems identified  Her A1c is slightly better at 7.4 As discussed above difficult to know when she is having post prandial hyperglycemia as she is monitoring after meals only sporadically and late at night Also does not adjust her Novolog based on her carbohydrate intake and meal size, not aware of what foods make her sugars go up Still having relatively unbalanced meals with more carbohydrates and less protein even though her portions are relatively small  However she is having low normal blood sugars fasting including 1 episode of hypoglycemia at 4 AM  Again difficulty with getting her weight down as she cannot exercise much  NEUROPATHY: Symptoms adequately controlled with gabapentin  ANEMIA: She has microcytic anemia but not clear if she may have B12 deficiency also, not benefiting from iron supplements   PLAN:  She will need to start checking blood sugars at 2 hours after meals more often than fasting  Discussed blood sugar targets and timing of testing  More consistent protein intake at every meal including low-fat dairy products, beans, lentils and soybean  Reduce Lantus by  at least 2 units  Check iron and B12 levels and forward results to PCP  PREVNAR given, has not had any immunizations for pneumonia  Patient Instructions  Check blood sugars on waking up 3  times a week Also check blood sugars about 2 hours after a meal and do this after different meals by rotation  Recommended blood sugar levels on waking up is 90-130 and about 2 hours after meal is 130-160  Please bring your blood sugar monitor to each visit, thank you  Lantus 28 units  Novolog 12-16 units depending on amount of food or starch     Counseling time on subjects discussed above is over 50% of today's 25 minute visit  Elodia Haviland 01/19/2015, 11:01 AM

## 2015-01-24 ENCOUNTER — Telehealth: Payer: Self-pay | Admitting: Cardiovascular Disease

## 2015-01-24 NOTE — Telephone Encounter (Signed)
Pt's son wants to rs procedure  to 02-06-15 is poss -pls call

## 2015-01-24 NOTE — Telephone Encounter (Signed)
Informed patient's son that January 18 is the only day in January Dr. Irish Lack is doing the CTO procedure. Explained to him that the next opportunity would be February 22, and advised to keep appointment time. Patient's son agrees with treatment plan and is grateful for callback.

## 2015-01-27 ENCOUNTER — Other Ambulatory Visit: Payer: Self-pay | Admitting: *Deleted

## 2015-01-27 DIAGNOSIS — D649 Anemia, unspecified: Secondary | ICD-10-CM

## 2015-01-27 MED ORDER — FERROUS SULFATE 325 (65 FE) MG PO TABS: 325.0000 mg | ORAL_TABLET | Freq: Two times a day (BID) | ORAL | Status: AC

## 2015-02-01 ENCOUNTER — Other Ambulatory Visit (INDEPENDENT_AMBULATORY_CARE_PROVIDER_SITE_OTHER): Payer: Medicaid Other | Admitting: *Deleted

## 2015-02-01 DIAGNOSIS — E785 Hyperlipidemia, unspecified: Secondary | ICD-10-CM

## 2015-02-01 DIAGNOSIS — I25119 Atherosclerotic heart disease of native coronary artery with unspecified angina pectoris: Secondary | ICD-10-CM | POA: Diagnosis not present

## 2015-02-01 DIAGNOSIS — I10 Essential (primary) hypertension: Secondary | ICD-10-CM

## 2015-02-01 LAB — CBC WITH DIFFERENTIAL/PLATELET
BASOS ABS: 0 10*3/uL (ref 0.0–0.1)
Basophils Relative: 0 % (ref 0–1)
EOS ABS: 0.1 10*3/uL (ref 0.0–0.7)
EOS PCT: 1 % (ref 0–5)
HEMATOCRIT: 31.2 % — AB (ref 36.0–46.0)
Hemoglobin: 9.5 g/dL — ABNORMAL LOW (ref 12.0–15.0)
LYMPHS ABS: 2.2 10*3/uL (ref 0.7–4.0)
LYMPHS PCT: 26 % (ref 12–46)
MCH: 19.7 pg — AB (ref 26.0–34.0)
MCHC: 30.4 g/dL (ref 30.0–36.0)
MCV: 64.6 fL — AB (ref 78.0–100.0)
MONOS PCT: 6 % (ref 3–12)
MPV: 8.7 fL (ref 8.6–12.4)
Monocytes Absolute: 0.5 10*3/uL (ref 0.1–1.0)
Neutro Abs: 5.8 10*3/uL (ref 1.7–7.7)
Neutrophils Relative %: 67 % (ref 43–77)
PLATELETS: 438 10*3/uL — AB (ref 150–400)
RBC: 4.83 MIL/uL (ref 3.87–5.11)
RDW: 18 % — AB (ref 11.5–15.5)
WBC: 8.6 10*3/uL (ref 4.0–10.5)

## 2015-02-01 LAB — PROTIME-INR
INR: 0.92 (ref <1.50)
Prothrombin Time: 12.5 seconds (ref 11.6–15.2)

## 2015-02-01 LAB — BASIC METABOLIC PANEL
BUN: 16 mg/dL (ref 7–25)
CALCIUM: 9.3 mg/dL (ref 8.6–10.4)
CO2: 23 mmol/L (ref 20–31)
Chloride: 102 mmol/L (ref 98–110)
Creat: 0.95 mg/dL (ref 0.50–0.99)
GLUCOSE: 126 mg/dL — AB (ref 65–99)
Potassium: 4.9 mmol/L (ref 3.5–5.3)
SODIUM: 137 mmol/L (ref 135–146)

## 2015-02-01 LAB — APTT: aPTT: 28 seconds (ref 24–37)

## 2015-02-02 ENCOUNTER — Telehealth: Payer: Self-pay | Admitting: *Deleted

## 2015-02-02 NOTE — Telephone Encounter (Signed)
Per Cecilie Kicks, NP, called pt to inform her that her labs were stable.  Spoke with husband.  He verbalized understanding.

## 2015-02-02 NOTE — Telephone Encounter (Signed)
-----   Message from Isaiah Serge, NP sent at 02/02/2015  8:01 AM EST ----- Labs stable for cath, though I do not see cbc.

## 2015-02-08 ENCOUNTER — Encounter (HOSPITAL_COMMUNITY): Payer: Self-pay | Admitting: Interventional Cardiology

## 2015-02-08 ENCOUNTER — Ambulatory Visit (HOSPITAL_COMMUNITY)
Admission: RE | Admit: 2015-02-08 | Discharge: 2015-02-09 | Disposition: A | Discharge status: Home / Self Care | Payer: Medicaid Other | Source: Ambulatory Visit | Attending: Interventional Cardiology | Admitting: Interventional Cardiology

## 2015-02-08 ENCOUNTER — Encounter (HOSPITAL_COMMUNITY)
Admission: RE | Disposition: A | Discharge status: Home / Self Care | Payer: Medicaid Other | Source: Ambulatory Visit | Attending: Interventional Cardiology

## 2015-02-08 DIAGNOSIS — I25119 Atherosclerotic heart disease of native coronary artery with unspecified angina pectoris: Secondary | ICD-10-CM | POA: Insufficient documentation

## 2015-02-08 DIAGNOSIS — Z8249 Family history of ischemic heart disease and other diseases of the circulatory system: Secondary | ICD-10-CM | POA: Diagnosis not present

## 2015-02-08 DIAGNOSIS — T82855A Stenosis of coronary artery stent, initial encounter: Secondary | ICD-10-CM | POA: Insufficient documentation

## 2015-02-08 DIAGNOSIS — Y712 Prosthetic and other implants, materials and accessory cardiovascular devices associated with adverse incidents: Secondary | ICD-10-CM | POA: Diagnosis not present

## 2015-02-08 DIAGNOSIS — I10 Essential (primary) hypertension: Secondary | ICD-10-CM | POA: Insufficient documentation

## 2015-02-08 DIAGNOSIS — I340 Nonrheumatic mitral (valve) insufficiency: Secondary | ICD-10-CM | POA: Insufficient documentation

## 2015-02-08 DIAGNOSIS — Z7902 Long term (current) use of antithrombotics/antiplatelets: Secondary | ICD-10-CM | POA: Insufficient documentation

## 2015-02-08 DIAGNOSIS — Z7982 Long term (current) use of aspirin: Secondary | ICD-10-CM | POA: Diagnosis not present

## 2015-02-08 DIAGNOSIS — Z7984 Long term (current) use of oral hypoglycemic drugs: Secondary | ICD-10-CM | POA: Diagnosis not present

## 2015-02-08 DIAGNOSIS — Z794 Long term (current) use of insulin: Secondary | ICD-10-CM | POA: Insufficient documentation

## 2015-02-08 DIAGNOSIS — I252 Old myocardial infarction: Secondary | ICD-10-CM | POA: Insufficient documentation

## 2015-02-08 DIAGNOSIS — E785 Hyperlipidemia, unspecified: Secondary | ICD-10-CM | POA: Diagnosis not present

## 2015-02-08 DIAGNOSIS — E1140 Type 2 diabetes mellitus with diabetic neuropathy, unspecified: Secondary | ICD-10-CM | POA: Insufficient documentation

## 2015-02-08 DIAGNOSIS — E1149 Type 2 diabetes mellitus with other diabetic neurological complication: Secondary | ICD-10-CM

## 2015-02-08 DIAGNOSIS — K5909 Other constipation: Secondary | ICD-10-CM | POA: Insufficient documentation

## 2015-02-08 DIAGNOSIS — E11319 Type 2 diabetes mellitus with unspecified diabetic retinopathy without macular edema: Secondary | ICD-10-CM | POA: Insufficient documentation

## 2015-02-08 DIAGNOSIS — G8929 Other chronic pain: Secondary | ICD-10-CM | POA: Insufficient documentation

## 2015-02-08 DIAGNOSIS — I2582 Chronic total occlusion of coronary artery: Secondary | ICD-10-CM

## 2015-02-08 DIAGNOSIS — D509 Iron deficiency anemia, unspecified: Secondary | ICD-10-CM | POA: Insufficient documentation

## 2015-02-08 DIAGNOSIS — I2510 Atherosclerotic heart disease of native coronary artery without angina pectoris: Secondary | ICD-10-CM | POA: Diagnosis present

## 2015-02-08 DIAGNOSIS — I209 Angina pectoris, unspecified: Secondary | ICD-10-CM | POA: Diagnosis present

## 2015-02-08 HISTORY — PX: CARDIAC CATHETERIZATION: SHX172

## 2015-02-08 LAB — GLUCOSE, CAPILLARY
GLUCOSE-CAPILLARY: 108 mg/dL — AB (ref 65–99)
GLUCOSE-CAPILLARY: 116 mg/dL — AB (ref 65–99)
GLUCOSE-CAPILLARY: 223 mg/dL — AB (ref 65–99)
Glucose-Capillary: 183 mg/dL — ABNORMAL HIGH (ref 65–99)

## 2015-02-08 LAB — POCT ACTIVATED CLOTTING TIME
Activated Clotting Time: 291 seconds
Activated Clotting Time: 224 seconds

## 2015-02-08 SURGERY — CORONARY CTO INTERVENTION

## 2015-02-08 MED ORDER — SODIUM CHLORIDE 0.9 % IJ SOLN: 3.0000 mL | INTRAMUSCULAR | Status: DC | PRN

## 2015-02-08 MED ORDER — VERAPAMIL HCL 2.5 MG/ML IV SOLN
INTRAVENOUS | Status: DC | PRN
  Administered 2015-02-08: 09:00:00 via INTRA_ARTERIAL

## 2015-02-08 MED ORDER — MIDAZOLAM HCL 2 MG/2ML IJ SOLN
INTRAMUSCULAR | Status: DC | PRN
  Administered 2015-02-08: 1 mg via INTRAVENOUS
  Administered 2015-02-08: 2 mg via INTRAVENOUS
  Administered 2015-02-08 (×2): 1 mg via INTRAVENOUS

## 2015-02-08 MED ORDER — SODIUM CHLORIDE 0.9 % WEIGHT BASED INFUSION: 1.0000 mL/kg/h | INTRAVENOUS | Status: DC

## 2015-02-08 MED ORDER — SODIUM CHLORIDE 0.9 % IJ SOLN
3.0000 mL | Freq: Two times a day (BID) | INTRAMUSCULAR | Status: DC
  Administered 2015-02-08: 22:00:00 3 mL via INTRAVENOUS

## 2015-02-08 MED ORDER — MIDAZOLAM HCL 2 MG/2ML IJ SOLN
INTRAMUSCULAR | Status: AC
  Filled 2015-02-08: qty 2

## 2015-02-08 MED ORDER — FENTANYL CITRATE (PF) 100 MCG/2ML IJ SOLN
INTRAMUSCULAR | Status: AC
  Filled 2015-02-08: qty 2

## 2015-02-08 MED ORDER — INSULIN GLARGINE 100 UNIT/ML ~~LOC~~ SOLN
28.0000 [IU] | Freq: Every day | SUBCUTANEOUS | Status: DC
  Administered 2015-02-08: 22:00:00 28 [IU] via SUBCUTANEOUS
  Filled 2015-02-08 (×3): qty 0.28

## 2015-02-08 MED ORDER — ASPIRIN 81 MG PO CHEW: 81.0000 mg | CHEWABLE_TABLET | ORAL | Status: DC

## 2015-02-08 MED ORDER — ATORVASTATIN CALCIUM 20 MG PO TABS
20.0000 mg | ORAL_TABLET | Freq: Every day | ORAL | Status: DC
  Administered 2015-02-08: 15:00:00 20 mg via ORAL
  Filled 2015-02-08 (×2): qty 1

## 2015-02-08 MED ORDER — NITROGLYCERIN 0.4 MG SL SUBL: 0.4000 mg | SUBLINGUAL_TABLET | SUBLINGUAL | Status: DC | PRN

## 2015-02-08 MED ORDER — POLYETHYL GLYCOL-PROPYL GLYCOL 0.4-0.3 % OP SOLN: 1.0000 [drp] | OPHTHALMIC | Status: DC | PRN

## 2015-02-08 MED ORDER — CLOPIDOGREL BISULFATE 75 MG PO TABS
75.0000 mg | ORAL_TABLET | Freq: Once | ORAL | Status: AC
  Administered 2015-02-08: 75 mg via ORAL

## 2015-02-08 MED ORDER — INSULIN ASPART 100 UNIT/ML ~~LOC~~ SOLN
14.0000 [IU] | Freq: Three times a day (TID) | SUBCUTANEOUS | Status: DC
  Administered 2015-02-08 – 2015-02-09 (×3): 14 [IU] via SUBCUTANEOUS

## 2015-02-08 MED ORDER — CLOPIDOGREL BISULFATE 75 MG PO TABS
75.0000 mg | ORAL_TABLET | Freq: Every day | ORAL | Status: DC
  Administered 2015-02-09: 75 mg via ORAL
  Filled 2015-02-08: qty 1

## 2015-02-08 MED ORDER — CLOPIDOGREL BISULFATE 75 MG PO TABS
ORAL_TABLET | ORAL | Status: AC
  Filled 2015-02-08: qty 1

## 2015-02-08 MED ORDER — LIDOCAINE HCL (PF) 1 % IJ SOLN
INTRAMUSCULAR | Status: AC
  Filled 2015-02-08: qty 30

## 2015-02-08 MED ORDER — POLYVINYL ALCOHOL 1.4 % OP SOLN
1.0000 [drp] | OPHTHALMIC | Status: DC | PRN
  Filled 2015-02-08: qty 15

## 2015-02-08 MED ORDER — ASPIRIN 81 MG PO CHEW: 81.0000 mg | CHEWABLE_TABLET | Freq: Every day | ORAL | Status: DC

## 2015-02-08 MED ORDER — DOCUSATE SODIUM 100 MG PO CAPS
100.0000 mg | ORAL_CAPSULE | Freq: Every day | ORAL | Status: DC
  Administered 2015-02-08 – 2015-02-09 (×2): 100 mg via ORAL
  Filled 2015-02-08 (×3): qty 1

## 2015-02-08 MED ORDER — ONDANSETRON HCL 4 MG/2ML IJ SOLN: 4.0000 mg | Freq: Four times a day (QID) | INTRAMUSCULAR | Status: DC | PRN

## 2015-02-08 MED ORDER — LIDOCAINE HCL (PF) 1 % IJ SOLN
INTRAMUSCULAR | Status: DC | PRN
  Administered 2015-02-08: 25 mL

## 2015-02-08 MED ORDER — SODIUM CHLORIDE 0.9 % WEIGHT BASED INFUSION
3.0000 mL/kg/h | INTRAVENOUS | Status: DC
  Administered 2015-02-08: 3 mL/kg/h via INTRAVENOUS

## 2015-02-08 MED ORDER — ACETAMINOPHEN 325 MG PO TABS: 650.0000 mg | ORAL_TABLET | ORAL | Status: DC | PRN

## 2015-02-08 MED ORDER — GABAPENTIN 300 MG PO CAPS
800.0000 mg | ORAL_CAPSULE | Freq: Three times a day (TID) | ORAL | Status: DC
  Administered 2015-02-08 – 2015-02-09 (×3): 800 mg via ORAL
  Filled 2015-02-08 (×4): qty 2

## 2015-02-08 MED ORDER — NITROGLYCERIN 1 MG/10 ML FOR IR/CATH LAB
INTRA_ARTERIAL | Status: AC
  Filled 2015-02-08: qty 10

## 2015-02-08 MED ORDER — FENTANYL CITRATE (PF) 100 MCG/2ML IJ SOLN
INTRAMUSCULAR | Status: DC | PRN
  Administered 2015-02-08 (×6): 25 ug via INTRAVENOUS

## 2015-02-08 MED ORDER — HEPARIN SODIUM (PORCINE) 1000 UNIT/ML IJ SOLN
INTRAMUSCULAR | Status: AC
  Filled 2015-02-08: qty 1

## 2015-02-08 MED ORDER — HEPARIN (PORCINE) IN NACL 2-0.9 UNIT/ML-% IJ SOLN
INTRAMUSCULAR | Status: AC
  Filled 2015-02-08: qty 1000

## 2015-02-08 MED ORDER — LIRAGLUTIDE 18 MG/3ML ~~LOC~~ SOPN
9.0000 mL | PEN_INJECTOR | Freq: Every day | SUBCUTANEOUS | Status: DC
  Administered 2015-02-08: 54 mg via SUBCUTANEOUS

## 2015-02-08 MED ORDER — CLOPIDOGREL BISULFATE 75 MG PO TABS: 75.0000 mg | ORAL_TABLET | Freq: Every day | ORAL | Status: DC

## 2015-02-08 MED ORDER — LIDOCAINE HCL (PF) 1 % IJ SOLN
INTRAMUSCULAR | Status: DC | PRN
  Administered 2015-02-08: 10:00:00

## 2015-02-08 MED ORDER — ASPIRIN EC 81 MG PO TBEC
81.0000 mg | DELAYED_RELEASE_TABLET | Freq: Every day | ORAL | Status: DC
  Administered 2015-02-09: 09:00:00 81 mg via ORAL
  Filled 2015-02-08: qty 1

## 2015-02-08 MED ORDER — CARVEDILOL 3.125 MG PO TABS
3.1250 mg | ORAL_TABLET | Freq: Two times a day (BID) | ORAL | Status: DC
  Administered 2015-02-08 – 2015-02-09 (×2): 3.125 mg via ORAL
  Filled 2015-02-08 (×2): qty 1

## 2015-02-08 MED ORDER — SODIUM CHLORIDE 0.9 % IJ SOLN
3.0000 mL | Freq: Two times a day (BID) | INTRAMUSCULAR | Status: DC
  Administered 2015-02-08: 3 mL via INTRAVENOUS

## 2015-02-08 MED ORDER — VERAPAMIL HCL 2.5 MG/ML IV SOLN
INTRAVENOUS | Status: AC
  Filled 2015-02-08: qty 2

## 2015-02-08 MED ORDER — LISINOPRIL 5 MG PO TABS
2.5000 mg | ORAL_TABLET | Freq: Every day | ORAL | Status: DC
  Administered 2015-02-08 – 2015-02-09 (×2): 2.5 mg via ORAL
  Filled 2015-02-08 (×2): qty 1

## 2015-02-08 MED ORDER — INSULIN ASPART 100 UNIT/ML FLEXPEN
14.0000 [IU] | PEN_INJECTOR | Freq: Three times a day (TID) | SUBCUTANEOUS | Status: DC
Start: 2015-02-08 — End: 2015-02-08

## 2015-02-08 MED ORDER — SODIUM CHLORIDE 0.9 % WEIGHT BASED INFUSION
1.0000 mL/kg/h | INTRAVENOUS | Status: AC
  Administered 2015-02-08: 1 mL/kg/h via INTRAVENOUS

## 2015-02-08 MED ORDER — FERROUS SULFATE 325 (65 FE) MG PO TABS
325.0000 mg | ORAL_TABLET | Freq: Two times a day (BID) | ORAL | Status: DC
  Administered 2015-02-08 – 2015-02-09 (×2): 325 mg via ORAL
  Filled 2015-02-08 (×2): qty 1

## 2015-02-08 MED ORDER — METFORMIN HCL 500 MG PO TABS: 1000.0000 mg | ORAL_TABLET | Freq: Two times a day (BID) | ORAL | Status: DC

## 2015-02-08 MED ORDER — SODIUM CHLORIDE 0.9 % IV SOLN: INTRAVENOUS | Status: DC | PRN

## 2015-02-08 MED ORDER — SODIUM CHLORIDE 0.9 % IV SOLN: 250.0000 mL | INTRAVENOUS | Status: DC | PRN

## 2015-02-08 MED ORDER — IOHEXOL 350 MG/ML SOLN
INTRAVENOUS | Status: DC | PRN
  Administered 2015-02-08: 120 mL via INTRA_ARTERIAL

## 2015-02-08 MED ORDER — SODIUM CHLORIDE 0.9 % IV SOLN
250.0000 mL | INTRAVENOUS | Status: DC | PRN
Start: 2015-02-08 — End: 2015-02-09

## 2015-02-08 MED ORDER — INSULIN GLARGINE 100 UNIT/ML SOLOSTAR PEN: 28.0000 [IU] | PEN_INJECTOR | Freq: Every day | SUBCUTANEOUS | Status: DC

## 2015-02-08 MED ORDER — HEPARIN SODIUM (PORCINE) 1000 UNIT/ML IJ SOLN
INTRAMUSCULAR | Status: DC | PRN
  Administered 2015-02-08: 8000 [IU] via INTRAVENOUS

## 2015-02-08 SURGICAL SUPPLY — 26 items
ANGIOSEAL 8FR (Vascular Products) ×3 IMPLANT
BALLN EMERGE MR 2.0X30 (BALLOONS) ×3
BALLN EMERGE MR 3.0X15 (BALLOONS) ×3
BALLN ~~LOC~~ EMERGE MR 3.0X15 (BALLOONS) ×3
BALLOON EMERGE MR 2.0X30 (BALLOONS) ×2 IMPLANT
BALLOON EMERGE MR 3.0X15 (BALLOONS) ×2 IMPLANT
BALLOON ~~LOC~~ EMERGE MR 3.0X15 (BALLOONS) ×2 IMPLANT
CATH CROSSBOSS CTO (CATHETERS) ×3 IMPLANT
CATH DIAG 6FR JR4 (CATHETERS) ×3 IMPLANT
CATH MACH1 8F VL3.5 (CATHETERS) ×3 IMPLANT
DEVICE CLOSURE ANGIOSEAL 8FR (Vascular Products) ×2 IMPLANT
DEVICE RAD COMP TR BAND LRG (VASCULAR PRODUCTS) ×3 IMPLANT
GLIDESHEATH SLEND SS 6F .021 (SHEATH) ×3 IMPLANT
KIT ENCORE 26 ADVANTAGE (KITS) ×3 IMPLANT
KIT HEART LEFT (KITS) ×6 IMPLANT
PACK CARDIAC CATHETERIZATION (CUSTOM PROCEDURE TRAY) ×3 IMPLANT
SHEATH BRITE TIP 8FR 35CM (SHEATH) ×3 IMPLANT
SHEATH PINNACLE 7F 10CM (SHEATH) IMPLANT
STENT SYNERGY DES 2.5X38 (Permanent Stent) ×3 IMPLANT
TRANSDUCER W/STOPCOCK (MISCELLANEOUS) ×6 IMPLANT
TUBING CIL FLEX 10 FLL-RA (TUBING) ×3 IMPLANT
VALVE GUARDIAN II ~~LOC~~ HEMO (MISCELLANEOUS) ×3 IMPLANT
WIRE ASAHI FIELDER XT 190CM (WIRE) ×3 IMPLANT
WIRE ASAHI PROWATER 180CM (WIRE) ×3 IMPLANT
WIRE EMERALD 3MM-J .035X150CM (WIRE) ×3 IMPLANT
WIRE SAFE-T 1.5MM-J .035X260CM (WIRE) ×3 IMPLANT

## 2015-02-08 NOTE — Interval H&P Note (Signed)
Cath Lab Visit (complete for each Cath Lab visit)  Clinical Evaluation Leading to the Procedure:   ACS: No.  Non-ACS:    Anginal Classification: CCS III  Anti-ischemic medical therapy: Maximal Therapy (2 or more classes of medications)  Non-Invasive Test Results: No non-invasive testing performed  Prior CABG: No previous CABG  Still with significant DOE.    History and Physical Interval Note:  02/08/2015 8:51 AM  Sarina Ser  has presented today for surgery, with the diagnosis of cad  The various methods of treatment have been discussed with the patient and family. After consideration of risks, benefits and other options for treatment, the patient has consented to  Procedure(s): Coronary/Bypass Graft CTO Intervention (N/A) as a surgical intervention .  The patient's history has been reviewed, patient examined, no change in status, stable for surgery.  I have reviewed the patient's chart and labs.  Questions were answered to the patient's satisfaction.     Nafisa Olds S.

## 2015-02-08 NOTE — Progress Notes (Signed)
TR BAND REMOVAL  LOCATION:    right radial  DEFLATED PER PROTOCOL:    Yes.    TIME BAND OFF / DRESSING APPLIED:    1415   SITE UPON ARRIVAL:    Level 0  SITE AFTER BAND REMOVAL:    Level 0  CIRCULATION SENSATION AND MOVEMENT:    Within Normal Limits   Yes.    COMMENTS:   Rechecked site at 1445 and frequently throughout remainder of shift with no change in assessment.

## 2015-02-08 NOTE — H&P (View-Only) (Signed)
Cardiology Office Note   Date:  01/18/2015   ID:  Kathleen Holt, DOB [birth year], MRN [id number]  PCP:  Minerva Ends, MD  Cardiologist:  Dr. Radford Pax    Chief Complaint  Patient presents with  . Coronary Artery Disease    no chest pain      History of Present Illness: Kathleen Holt is a 60 y.o. female who presents for procedure follow up.  She had abnormal nuc study and was arranged for cath finding 90% stenosisi in dRCA and 100% in prox to mlcx.  RCA treated with 3 overlapping synergy DES stents.  Plan for CTO of LCX scheduled for 02/07/14.    Today  No chest pain no SOB.  Is walking 30 min per day. Her husband stated she eats heart healthy diet and her glucose is stable.   She understands about need for further intervention- with CTO to LCX.  This is scheduled for 02/07/14.  She will need an interpreter and request Audiological scientist the language is [place]. [place].  She is on ASA 325 and with her Iron def. Anemia we will decrease to 81 mg.    i reviewed her recent labs and lipids are great.     Past Medical History  Diagnosis Date  . Coronary artery disease     a. s/p stenting 11/2009 [facility] b. 12/26/2014: Dist RCA 90% stenosed and 100% stenosis of the Prox to Mid-Cx. 3 DES placed to RCA.   Marland Kitchen Hyperlipidemia   . Ejection fraction      EF 45-50%,  echo, February 10, 2012, akinesis posterior lateral wall, diastolic dysfunction, mild mitral regurgitation,  . Mitral regurgitation     Mild, echo, January, 2014  . Type II diabetes mellitus ([place])   . Diabetic neuropathy ([place])   . Myocardial infarction ([place]) 2011  . Chronic lower back pain   . Chronic constipation     Past Surgical History  Procedure Laterality Date  . Eye surgery    . Vaginal hysterectomy    . Cataract extraction w/ intraocular lens  implant, bilateral    . Coronary angioplasty with stent placement  2011; 12/26/2014  . Cardiac catheterization N/A 12/26/2014   Procedure: Left Heart Cath and Coronary Angiography;  Surgeon: Jettie Booze, MD;  Location: [place] [facility];  Service: Cardiovascular;  Laterality: N/A;  . Cardiac catheterization N/A 12/26/2014    Procedure: Coronary Stent Intervention;  Surgeon: Jettie Booze, MD;  Location: [place] [facility];  Service: Cardiovascular; 3 overlapping Synergy drug-eluting stents, 2.25 x 16, 2.5 x 38 and 3.0 x 32 to the RCA     Current Outpatient Prescriptions  Medication Sig Dispense Refill  . ACCU-CHEK SOFTCLIX LANCETS lancets Use as instructed to check blood sugar 2 times per day dx code E11.40 100 each 3  . aspirin 325 MG tablet Take 325 mg by mouth daily.    Marland Kitchen atorvastatin (LIPITOR) 20 MG tablet Take 1 tablet (20 mg total) by mouth daily. 30 tablet 5  . Blood Glucose Monitoring Suppl (ACCU-CHEK AVIVA PLUS) W/DEVICE KIT Use to check blood sugar 2 times per day dx code E11.40 1 kit 1  . carvedilol (COREG) 3.125 MG tablet Take 1 tablet (3.125 mg total) by mouth 2 (two) times daily with a meal. 60 tablet 5  . clopidogrel (PLAVIX) 75 MG tablet Take 1 tablet (75 mg total) by mouth daily. 90 tablet 3  . docusate sodium (COLACE) 100 MG capsule Take 1  capsule (100 mg total) by mouth daily. 10 capsule 0  . ferrous sulfate 325 (65 FE) MG tablet Take 1 tablet (325 mg total) by mouth 2 (two) times daily with a meal. 60 tablet 3  . gabapentin (NEURONTIN) 400 MG capsule Take 2 capsules (800 mg total) by mouth 3 (three) times daily. 240 capsule 5  . glucose blood (ACCU-CHEK AVIVA PLUS) test strip Use as instructed to check blood sugar 2 times per day dx code E11.40 100 each 3  . insulin aspart (NOVOLOG FLEXPEN) 100 UNIT/ML FlexPen Inject 14 Units into the skin 3 (three) times daily with meals. 15 pen 2  . Insulin Glargine (LANTUS SOLOSTAR) 100 UNIT/ML Solostar Pen Inject 30 Units into the skin daily at 10 pm. 45 mL 1  . Insulin Pen Needle 31G X 5 MM MISC Use 5 per day to inject insulin 150 each 2  .  Liraglutide 18 MG/3ML SOPN Inject 0.2 mLs (1.2 mg total) into the skin daily. (Patient taking differently: Inject 9 mg into the skin daily. VICTOZA) 2 pen 5  . lisinopril (PRINIVIL,ZESTRIL) 2.5 MG tablet Take 1 tablet (2.5 mg total) by mouth daily. 30 tablet 5  . metFORMIN (GLUCOPHAGE) 1000 MG tablet Take 1 tablet (1,000 mg total) by mouth 2 (two) times daily with a meal. 60 tablet 5  . nitroGLYCERIN (NITROSTAT) 0.4 MG SL tablet Place 1 tablet (0.4 mg total) under the tongue every 5 (five) minutes x 3 doses as needed for chest pain. 25 tablet 3  . Polyethyl Glycol-Propyl Glycol 0.4-0.3 % SOLN 1 drop in each eye every 3 hours as needed. (Patient taking differently: 1 drop in each eye every 3 hours as needed for dry eyes.) 10 mL 12  . Vitamin D, Ergocalciferol, (DRISDOL) 50000 UNITS CAPS capsule Take 1 capsule (50,000 Units total) by mouth every 7 (seven) days. For 8 weeks 8 capsule 0   No current facility-administered medications for this visit.    Allergies:   Review of patient's allergies indicates no known allergies.    Social History:  The patient  reports that she has never smoked. She has never used smokeless tobacco. She reports that she does not drink alcohol or use illicit drugs.   Family History:  The patient's family history includes CAD in her brother and father; Cancer in her father; Diabetes in her father; Heart attack in her brother and father; Hypertension in her father. There is no history of Stroke.    ROS:  General:no colds or fevers, no weight changes Skin:no rashes or ulcers HEENT:no blurred vision, no congestion CV:see HPI PUL:see HPI GI:no diarrhea constipation or melena, no indigestion GU:no hematuria, no dysuria MS:no joint pain, no claudication Neuro:no syncope, no lightheadedness Endo:+ diabetes-stable, no thyroid disease  Wt Readings from Last 3 Encounters:  01/18/15 189 lb (85.73 kg)  12/27/14 187 lb 2.7 oz (84.9 kg)  12/19/14 188 lb 6.4 oz (85.458 kg)       PHYSICAL EXAM: VS:  BP 120/60 mmHg  Pulse 89  Ht 5' (1.524 m)  Wt 189 lb (85.73 kg)  BMI 36.91 kg/m2  SpO2 98% , BMI Body mass index is 36.91 kg/(m^2). General:Pleasant affect, NAD Skin:Warm and dry, brisk capillary refill HEENT:normocephalic, sclera clear, mucus membranes moist Neck:supple, no JVD, no bruits  Heart:S1S2 RRR without murmur, gallup, rub or click Lungs:clear without rales, rhonchi, or wheezes DVV:OHYW, non tender, + BS, do not palpate liver spleen or masses Ext:no lower ext edema, 2+ pedal pulses, 2+ radial pulses-rt wrist  cath site without hematoma Neuro:alert and oriented X 3, MAE, follows commands, + facial symmetry    EKG:  EKG is NOT ordered today.    Recent Labs: 07/20/2014: ALT 18; TSH 3.02 12/27/2014: Hemoglobin 9.8*; Platelets 370 01/17/2015: BUN 13; Creatinine, Ser 0.89; Potassium 4.9; Sodium 139    Lipid Panel    Component Value Date/Time   CHOL 119 01/17/2015 0912   TRIG 124.0 01/17/2015 0912   HDL 35.40* 01/17/2015 0912   CHOLHDL 3 01/17/2015 0912   VLDL 24.8 01/17/2015 0912   LDLCALC 59 01/17/2015 0912       Other studies Reviewed: Additional studies/ records that were reviewed today include: previous cath. Discharge summary.  CATH: Conclusion     Dist RCA lesion, 90% stenosed. Chronically occluded mid RCA stent with moderate proximal to mid vessel disease as well.  Prox Cx to Mid Cx lesion, 100% stenosed. The lesion was previously treated with a stent (unknown type).  Dist Cx lesion, 90% stenosed. Left to left collaterals feed the distal circ territory.  The RCA was treated with 3 overlapping Synergy drug-eluting stents, 2.25 x 16, 2.5 x 38 and 3.0 x 32. They were all postdilated with a 3.5 mm noncompliant balloon. Post intervention, there is a 0% residual stenosis. The lesion was previously treated with a stent (unknown type).  Continue dual antiplatelet therapy for at least a year without interruption and likely beyond given  the number stents she has. She'll be watched overnight in the hospital with planned discharge tomorrow.  We'll plan to bring the patient back for PCI of her CTO of the circumflex at a later time. This does appear amenable to PCI.     ASSESSMENT AND PLAN:  1.  CAD with recent angina and positive stress test.  Cardiac cath with stenosis of dRCA with 3 overlapping DES.  Also with LCX occl with plan for CTO of this vessel 02/07/14.  Doing well today, no chest pain. -will need interpreter for [place] dialect  2. HTN controlled  3. Hyperlipidemia controlled  4. DM-2 stable.   Current medicines are reviewed with the patient today.  The patient Has no concerns regarding medicines.  The following changes have been made:  See above Labs/ tests ordered today include:see above  Disposition:   FU:  see above  Lennie Muckle, NP  01/18/2015 9:32 AM    [facility] [place], [place], [place], [place] Phone: [phone]; Fax: [phone]

## 2015-02-09 ENCOUNTER — Other Ambulatory Visit: Payer: Self-pay

## 2015-02-09 DIAGNOSIS — I209 Angina pectoris, unspecified: Secondary | ICD-10-CM | POA: Diagnosis not present

## 2015-02-09 DIAGNOSIS — I10 Essential (primary) hypertension: Secondary | ICD-10-CM | POA: Diagnosis not present

## 2015-02-09 DIAGNOSIS — I25119 Atherosclerotic heart disease of native coronary artery with unspecified angina pectoris: Secondary | ICD-10-CM | POA: Diagnosis not present

## 2015-02-09 DIAGNOSIS — T82855A Stenosis of coronary artery stent, initial encounter: Secondary | ICD-10-CM | POA: Diagnosis not present

## 2015-02-09 DIAGNOSIS — E11319 Type 2 diabetes mellitus with unspecified diabetic retinopathy without macular edema: Secondary | ICD-10-CM | POA: Diagnosis not present

## 2015-02-09 LAB — GLUCOSE, CAPILLARY: GLUCOSE-CAPILLARY: 126 mg/dL — AB (ref 65–99)

## 2015-02-09 LAB — CBC
HCT: 29.3 % — ABNORMAL LOW (ref 36.0–46.0)
HEMOGLOBIN: 8.7 g/dL — AB (ref 12.0–15.0)
MCH: 19.2 pg — AB (ref 26.0–34.0)
MCHC: 29.7 g/dL — ABNORMAL LOW (ref 30.0–36.0)
MCV: 64.5 fL — ABNORMAL LOW (ref 78.0–100.0)
Platelets: 407 10*3/uL — ABNORMAL HIGH (ref 150–400)
RBC: 4.54 MIL/uL (ref 3.87–5.11)
RDW: 18.3 % — ABNORMAL HIGH (ref 11.5–15.5)
WBC: 6.9 10*3/uL (ref 4.0–10.5)

## 2015-02-09 LAB — BASIC METABOLIC PANEL
ANION GAP: 7 (ref 5–15)
BUN: 8 mg/dL (ref 6–20)
CALCIUM: 8.3 mg/dL — AB (ref 8.9–10.3)
CO2: 25 mmol/L (ref 22–32)
Chloride: 107 mmol/L (ref 101–111)
Creatinine, Ser: 0.9 mg/dL (ref 0.44–1.00)
Glucose, Bld: 173 mg/dL — ABNORMAL HIGH (ref 65–99)
Potassium: 4.6 mmol/L (ref 3.5–5.1)
SODIUM: 139 mmol/L (ref 135–145)

## 2015-02-09 MED ORDER — ANGIOPLASTY BOOK
Freq: Once | Status: AC
  Administered 2015-02-09: 05:00:00
  Filled 2015-02-09: qty 1

## 2015-02-09 MED ORDER — METFORMIN HCL 1000 MG PO TABS: 1000.0000 mg | ORAL_TABLET | Freq: Two times a day (BID) | ORAL | Status: DC

## 2015-02-09 MED FILL — Nitroglycerin IV Soln 100 MCG/ML in D5W: INTRA_ARTERIAL | Qty: 10 | Status: AC

## 2015-02-09 NOTE — Progress Notes (Addendum)
CARDIAC REHAB PHASE I   PRE:  Rate/Rhythm: 84 SR  BP:  Sitting: 115/62        SaO2: 99 RA  MODE:  Ambulation: 500 ft   POST:  Rate/Rhythm: 98 SR  BP:  Sitting: 151/73         SaO2: 99 RA  Pt seen in December, states she feels well. Pt ambulated 500 ft on RA, handheld assist, steady gait, tolerated well.  Pt c/o of mild DOE, denies cp, dizziness, declined rest stop. Completed PCI/stent education with pt and husband at bedside.  Reviewed risk factors, anti-platelet therapy, stent card, activity restrictions, ntg, exercise, heart healthy diet, carb counting, portion control, daily weights, s/s CHF, and phase 2 cardiac rehab. Pt and husband verbalized understanding, receptive to education. Pt agrees to phase 2 cardiac rehab referral, will send to [facility]. Pt to chair after walk, call bell within reach.   CA:[phone]  Lenna Sciara, RN, BSN 02/09/2015 8:50 AM

## 2015-02-09 NOTE — Progress Notes (Signed)
Patient Profile: 60 y/o female with h/o CAD, HTN, HLD and DM admitted for elective PCI of a CTO LCx. She is s/p PCI w/ DES x 3 to the proximal RCA 12/2014.   Subjective: No complaints. Denies CP and dyspnea. Ambulating w/o difficulty. Right radial cath site is stable.   Objective: Vital signs in last 24 hours: Temp:  [97.6 F (36.4 C)-98.5 F (36.9 C)] 98.1 F (36.7 C) (01/19 0400) Pulse Rate:  [77-90] 85 (01/19 0400) Resp:  [12-54] 19 (01/19 0400) BP: (99-165)/(51-91) 119/59 mmHg (01/19 0400) SpO2:  [96 %-100 %] 98 % (01/19 0400) Weight:  [180 lb (81.647 kg)-187 lb 6.3 oz (85 kg)] 187 lb 6.3 oz (85 kg) (01/19 0400)    Intake/Output from previous day: 01/18 0701 - 01/19 0700 In: 740 [P.O.:240; I.V.:500] Out: 2400 [Urine:2400] Intake/Output this shift: Total I/O In: -  Out: 500 [Urine:500]  Medications Current Facility-Administered Medications  Medication Dose Route Frequency Provider Last Rate Last Dose  . 0.9 %  sodium chloride infusion  250 mL Intravenous PRN Isaiah Serge, NP      . 0.9 %  sodium chloride infusion  250 mL Intravenous PRN Jettie Booze, MD      . acetaminophen (TYLENOL) tablet 650 mg  650 mg Oral Q4H PRN Jettie Booze, MD      . aspirin EC tablet 81 mg  81 mg Oral Daily Jettie Booze, MD      . atorvastatin (LIPITOR) tablet 20 mg  20 mg Oral q1800 Jettie Booze, MD   20 mg at 02/08/15 1440  . carvedilol (COREG) tablet 3.125 mg  3.125 mg Oral BID WC Jettie Booze, MD   3.125 mg at 02/08/15 1439  . clopidogrel (PLAVIX) tablet 75 mg  75 mg Oral Daily Jettie Booze, MD      . docusate sodium (COLACE) capsule 100 mg  100 mg Oral Daily Jettie Booze, MD   100 mg at 02/08/15 1440  . ferrous sulfate tablet 325 mg  325 mg Oral BID WC Jettie Booze, MD   325 mg at 02/08/15 1440  . gabapentin (NEURONTIN) capsule 800 mg  800 mg Oral TID Jettie Booze, MD   800 mg at 02/08/15 2134  . insulin aspart (novoLOG)  injection 14 Units  14 Units Subcutaneous TID WC Skeet Simmer, RPH   14 Units at 02/08/15 1916  . insulin glargine (LANTUS) injection 28 Units  28 Units Subcutaneous QHS Skeet Simmer, [facility]   28 Units at 02/08/15 2219  . Liraglutide SOPN 54 mg  9 mL Subcutaneous QHS Jettie Booze, MD   54 mg at 02/08/15 2135  . lisinopril (PRINIVIL,ZESTRIL) tablet 2.5 mg  2.5 mg Oral Daily Jettie Booze, MD   2.5 mg at 02/08/15 1440  . [START ON 02/10/2015] metFORMIN (GLUCOPHAGE) tablet 1,000 mg  1,000 mg Oral BID WC Jettie Booze, MD      . nitroGLYCERIN (NITROSTAT) SL tablet 0.4 mg  0.4 mg Sublingual Q5 Min x 3 PRN Jettie Booze, MD      . ondansetron [facility]) injection 4 mg  4 mg Intravenous Q6H PRN Jettie Booze, MD      . polyvinyl alcohol (LIQUIFILM TEARS) 1.4 % ophthalmic solution 1 drop  1 drop Both Eyes PRN Skeet Simmer, RPH      . sodium chloride 0.9 % injection 3 mL  3 mL Intravenous Q12H Isaiah Serge, NP  3 mL at 02/08/15 2135  . sodium chloride 0.9 % injection 3 mL  3 mL Intravenous PRN Isaiah Serge, NP      . sodium chloride 0.9 % injection 3 mL  3 mL Intravenous Q12H Jettie Booze, MD   3 mL at 02/08/15 2134  . sodium chloride 0.9 % injection 3 mL  3 mL Intravenous PRN Jettie Booze, MD        PE: General appearance: alert, cooperative and no distress, obese  Neck: no carotid bruit and no JVD Lungs: clear to auscultation bilaterally Heart: regular rate and rhythm, S1, S2 normal, no murmur, click, rub or gallop Extremities: no LEE Pulses: 2+ and symmetric Skin: warm and dry Neurologic: Grossly normal  Lab Results:   Recent Labs  02/09/15 0353  WBC 6.9  HGB 8.7*  HCT 29.3*  PLT 407*   BMET  Recent Labs  02/09/15 0353  NA 139  K 4.6  CL 107  CO2 25  GLUCOSE 173*  BUN 8  CREATININE 0.90  CALCIUM 8.3*    Studies/Results: Procedures    Coronary/Bypass Graft CTO Intervention   Left Heart Cath and Coronary Angiography      Conclusion     Patent stents in the right coronary artery.  Prox Cx to Mid Cx lesion, 100% stenosed. Post intervention with a 2.5 x 38 Synergy drug-eluting stent postdilated to greater than 3 mm extending into the first obtuse marginal, there is a 0% residual stenosis. The lesion was previously treated with a stent (unknown type).  Mildly elevated LVEDP.     Assessment/Plan  Active Problems:   Angina pectoris ([place])   1. CAD: Day 1 s/p successful CTO PCI of the LCx. The previously placed RCA stents are patent. She is stable w/o recurrent angina. Continue medical therapy. DAPT with ASA + Plavix, statin, BB and ACE-I.   2. Post Cath: vital signs stable. Renal function stable. Will instruct patient to hold metformin for an additional 24 hrs. Right radial cath site is stable w/o complication.   3. HTN: BP is controlled on current regimen. BB + ACE-I.  4. HLD: on statin therapy with Lipitor.  5. DM: hold Metformin for 48 hrs post cath.   6. Anemia: Hgb is 8.7. Downward trend over the last month (10-->9.8-->9.5-->8.7). Given she is on DAPT with ASA + Plavix, we will need to f/u as an outpatient with a CBC at time of post hospital f/u.    Brittainy M. Ladoris Gene 02/09/2015 6:35 AM

## 2015-02-09 NOTE — Discharge Instructions (Signed)
No heavy lifting for a week. No driving for 3 days.

## 2015-02-09 NOTE — Discharge Summary (Signed)
Physician Discharge Summary  Patient ID: Kathleen Holt MRN: [id number] DOB/AGE: 06/20/1955 60 y.o.   Primary Cardiologist: Dr. Radford Pax  Admit date: 02/08/2015 Discharge date: 02/09/2015  Admission Diagnoses: CAD; Angina   Discharge Diagnoses:  Active Problems:   Coronary artery disease   Diabetic retinopathy associated with type 2 diabetes mellitus ([place])   Essential hypertension, benign   Angina pectoris ([place])   Discharged Condition: stable  Hospital Course: 60 y/o female with h/o CAD, HTN, HLD and DM admitted for elective PCI of a CTO LCx. She is s/p PCI w/ DES x 3 to the proximal RCA 12/2014. She was admitted 02/08/15 for the planned procedure, performed by Dr. Irish Lack. She underwent successful CTO PCI of the LCx. The previously placed RCA stent was widely patent. She tolerated the procedure well and left the cath lab in stable condition. She had no recurrent CP and no post cath complications. She was continued on DAPT with ASA + Plavix, BB, statin and ACE-I.  Her renal function remained stable. Her metformin was held post cath. Her right radial cath site also remained stable w/o complication. No difficulties ambulating. She was last seen and examined by Dr. Radford Pax who determined she was stable for discharge home. She was given instructions to continue to hold metformin for an additional 36 hrs. Post hospital f/u will be arranged in 1-2 weeks.     Consults: None  Significant Diagnostic Studies: LHC 02/08/15 Procedures    Coronary/Bypass Graft CTO Intervention   Left Heart Cath and Coronary Angiography    Conclusion     Patent stents in the right coronary artery.  Prox Cx to Mid Cx lesion, 100% stenosed. Post intervention with a 2.5 x 38 Synergy drug-eluting stent postdilated to greater than 3 mm extending into the first obtuse marginal, there is a 0% residual stenosis. The lesion was previously treated with a stent (unknown type).  Mildly elevated LVEDP.          Treatments: See Hospital Course  Discharge Exam: Blood pressure 115/62, pulse 83, temperature 98 F (36.7 C), temperature source Oral, resp. rate 16, height 5' (1.524 m), weight 187 lb 6.3 oz (85 kg), SpO2 98 %.   Disposition: 01-Home or Self Care      Discharge Instructions    AMB Referral to Cardiac Rehabilitation - Phase II    Complete by:  As directed   Diagnosis:  PCI     Diet - low sodium heart healthy    Complete by:  As directed      Increase activity slowly    Complete by:  As directed             Medication List    TAKE these medications        ACCU-CHEK AVIVA PLUS w/Device Kit  Use to check blood sugar 2 times per day dx code E11.40     ACCU-CHEK SOFTCLIX LANCETS lancets  Use as instructed to check blood sugar 2 times per day dx code E11.40     aspirin 81 MG tablet  Take 1 tablet (81 mg total) by mouth daily.  Notes to Patient:  Prevents clotting in stent and heart attack     atorvastatin 20 MG tablet  Commonly known as:  LIPITOR  Take 1 tablet (20 mg total) by mouth daily.  Notes to Patient:  Cholesterol      carvedilol 3.125 MG tablet  Commonly known as:  COREG  Take 1 tablet (3.125 mg total) by mouth 2 (two) times  daily with a meal.  Notes to Patient:  Decreases work of the heart             Decreases heart rate and blood pressure     clopidogrel 75 MG tablet  Commonly known as:  PLAVIX  Take 1 tablet (75 mg total) by mouth daily.  Notes to Patient:  Prevents clotting in stent and heart attack     docusate sodium 100 MG capsule  Commonly known as:  COLACE  Take 1 capsule (100 mg total) by mouth daily.  Notes to Patient:  Stool softner     ferrous sulfate 325 (65 FE) MG tablet  Take 1 tablet (325 mg total) by mouth 2 (two) times daily with a meal.  Notes to Patient:  Iron      gabapentin 400 MG capsule  Commonly known as:  NEURONTIN  Take 2 capsules (800 mg total) by mouth 3 (three) times daily.  Notes to Patient:  Nerve pain      glucose blood test strip  Commonly known as:  ACCU-CHEK AVIVA PLUS  Use as instructed to check blood sugar 2 times per day dx code E11.40     insulin aspart 100 UNIT/ML FlexPen  Commonly known as:  NOVOLOG FLEXPEN  Inject 14 Units into the skin 3 (three) times daily with meals.  Notes to Patient:  Diabetes      Insulin Glargine 100 UNIT/ML Solostar Pen  Commonly known as:  LANTUS SOLOSTAR  Inject 30 Units into the skin daily at 10 pm.     Insulin Pen Needle 31G X 5 MM Misc  Use 5 per day to inject insulin     lisinopril 2.5 MG tablet  Commonly known as:  PRINIVIL,ZESTRIL  Take 1 tablet (2.5 mg total) by mouth daily.  Notes to Patient:  Blood pressure     metFORMIN 1000 MG tablet  Commonly known as:  GLUCOPHAGE  Take 1 tablet (1,000 mg total) by mouth 2 (two) times daily with a meal.  Start taking on:  02/11/2015  Notes to Patient:  Diabetes      nitroGLYCERIN 0.4 MG SL tablet  Commonly known as:  NITROSTAT  Place 1 tablet (0.4 mg total) under the tongue every 5 (five) minutes x 3 doses as needed for chest pain.  Notes to Patient:  Emergency chest pain     Polyethyl Glycol-Propyl Glycol 0.4-0.3 % Soln  Apply 1 drop to eye every 3 (three) hours as needed (FOR DRYNESS).     VICTOZA 18 MG/3ML Sopn  Generic drug:  Liraglutide  Inject 9 mLs into the skin at bedtime.  Notes to Patient:  Diabetes      Vitamin D (Ergocalciferol) 50000 units Caps capsule  Commonly known as:  DRISDOL  Take 1 capsule (50,000 Units total) by mouth every 7 (seven) days. For 8 weeks  Notes to Patient:  Supplement        Follow-up Information    Follow up with Sueanne Margarita, MD.   Specialty:  Cardiology   Why:  our office will call you with a follow-up appointment in 1-2 weeks   Contact information:   [address] [place] [address] [phone]      TIME SPENT ON DISCHARGE, INCLUDING PHYSICIAN TIME: >30 MINUTES  Signed: Lyda Jester 02/09/2015, 2:23 PM

## 2015-02-22 NOTE — Progress Notes (Addendum)
Cardiology Office Note:    Date:  02/23/2015   ID:  Kathleen Holt, DOB 01-08-1956, MRN [id number]  PCP:  Minerva Ends, MD  Cardiologist:  Dr. Fransico Him   Electrophysiologist:  n/a  Chief Complaint  Patient presents with  . Hospitalization Follow-up    Status post PCI  . Coronary Artery Disease     History of Present Illness:     Kathleen Holt is a 60 y.o. [place] female with a hx of CAD status post prior PCI with DES 3 in 11/2009 in [place], diabetes, ischemic cardiomyopathy, mitral regurgitation, HL, prior CVA. Prior patient of Dr. Cleatis Polka.The patient established with Dr. Radford Pax 12/01/14. She complained of exertional chest discomfort that resolved with nitroglycerin. Myoview was intermediate risk and demonstrated a large anterolateral and inferolateral scar but no ischemia, EF 39%.  She was set up for cardiac catheterization which demonstrated severe disease in the RCA and CTO of the LCx. She underwent PCI with 3 overlapping DES to the RCA. She was then set up for staged CTO PCI of the LCx. She returned to the hospital 02/08/15 and underwent successful CTO PCI of the LCx with a DES. RCA stents remain patent.   The patient is here today with her husband who helps interpret. She denies recurrent angina. She denies significant dyspnea. She denies orthopnea, PND or edema. She denies syncope. She denies fevers, chills, cough, wheezing. She denies melena or hematochezia. She does note pain over both sternoclavicular joints bilaterally. This seemed to start 2-3 days after her PCI. It does hurt worse with laying flat. She denies pleuritic symptoms. She thinks it may be getting better.   Past Medical History  Diagnosis Date  . Coronary artery disease     a. s/p stenting 11/2009 [facility] b. 12/26/2014: Dist RCA 90% stenosed and 100% stenosis of the Prox to Mid-Cx. 3 DES placed to RCA.   Marland Kitchen Hyperlipidemia   . Ejection fraction      EF 45-50%,   echo, February 10, 2012, akinesis posterior lateral wall, diastolic dysfunction, mild mitral regurgitation,  . Mitral regurgitation     Mild, echo, January, 2014  . Type II diabetes mellitus ([place])   . Diabetic neuropathy ([place])   . Myocardial infarction ([place]) 2011  . Chronic lower back pain   . Chronic constipation     Past Surgical History  Procedure Laterality Date  . Eye surgery    . Vaginal hysterectomy    . Cataract extraction w/ intraocular lens  implant, bilateral    . Coronary angioplasty with stent placement  2011; 12/26/2014  . Cardiac catheterization N/A 12/26/2014    Procedure: Left Heart Cath and Coronary Angiography;  Surgeon: Jettie Booze, MD;  Location: [place] [facility];  Service: Cardiovascular;  Laterality: N/A;  . Cardiac catheterization N/A 12/26/2014    Procedure: Coronary Stent Intervention;  Surgeon: Jettie Booze, MD;  Location: [place] [facility];  Service: Cardiovascular; 3 overlapping Synergy drug-eluting stents, 2.25 x 16, 2.5 x 38 and 3.0 x 32 to the RCA  . Cardiac catheterization N/A 02/08/2015    Procedure: Coronary/Bypass Graft CTO Intervention;  Surgeon: Jettie Booze, MD;  Location: [place] [facility];  Service: Cardiovascular;  Laterality: N/A;  . Cardiac catheterization  02/08/2015    Procedure: Left Heart Cath and Coronary Angiography;  Surgeon: Jettie Booze, MD;  Location: [place] [facility];  Service: Cardiovascular;;    Current Medications: Outpatient Prescriptions Prior to  Visit  Medication Sig Dispense Refill  . ACCU-CHEK SOFTCLIX LANCETS lancets Use as instructed to check blood sugar 2 times per day dx code E11.40 100 each 3  . aspirin 81 MG tablet Take 1 tablet (81 mg total) by mouth daily. 30 tablet 11  . atorvastatin (LIPITOR) 20 MG tablet Take 1 tablet (20 mg total) by mouth daily. 30 tablet 5  . Blood Glucose Monitoring Suppl (ACCU-CHEK AVIVA PLUS) W/DEVICE KIT Use to check blood sugar 2 times per day dx code E11.40  1 kit 1  . carvedilol (COREG) 3.125 MG tablet Take 1 tablet (3.125 mg total) by mouth 2 (two) times daily with a meal. 60 tablet 5  . clopidogrel (PLAVIX) 75 MG tablet Take 1 tablet (75 mg total) by mouth daily. 90 tablet 3  . docusate sodium (COLACE) 100 MG capsule Take 1 capsule (100 mg total) by mouth daily. 10 capsule 0  . ferrous sulfate 325 (65 FE) MG tablet Take 1 tablet (325 mg total) by mouth 2 (two) times daily with a meal. 60 tablet 3  . gabapentin (NEURONTIN) 400 MG capsule Take 2 capsules (800 mg total) by mouth 3 (three) times daily. 240 capsule 5  . glucose blood (ACCU-CHEK AVIVA PLUS) test strip Use as instructed to check blood sugar 2 times per day dx code E11.40 100 each 3  . insulin aspart (NOVOLOG FLEXPEN) 100 UNIT/ML FlexPen Inject 14 Units into the skin 3 (three) times daily with meals. 15 pen 2  . Insulin Pen Needle 31G X 5 MM MISC Use 5 per day to inject insulin 150 each 2  . Liraglutide (VICTOZA) 18 MG/3ML SOPN Inject 9 mLs into the skin at bedtime.     Marland Kitchen lisinopril (PRINIVIL,ZESTRIL) 2.5 MG tablet Take 1 tablet (2.5 mg total) by mouth daily. 30 tablet 5  . metFORMIN (GLUCOPHAGE) 1000 MG tablet Take 1 tablet (1,000 mg total) by mouth 2 (two) times daily with a meal. 60 tablet 5  . nitroGLYCERIN (NITROSTAT) 0.4 MG SL tablet Place 1 tablet (0.4 mg total) under the tongue every 5 (five) minutes x 3 doses as needed for chest pain. 25 tablet 3  . Polyethyl Glycol-Propyl Glycol 0.4-0.3 % SOLN Place 1 drop into both eyes every 3 (three) hours as needed (FOR DRYNESS).     . Insulin Glargine (LANTUS SOLOSTAR) 100 UNIT/ML Solostar Pen Inject 30 Units into the skin daily at 10 pm. (Patient not taking: Reported on 02/23/2015) 45 mL 1  . Vitamin D, Ergocalciferol, (DRISDOL) 50000 UNITS CAPS capsule Take 1 capsule (50,000 Units total) by mouth every 7 (seven) days. For 8 weeks (Patient not taking: Reported on 02/23/2015) 8 capsule 0   No facility-administered medications prior to visit.      Allergies:   Review of patient's allergies indicates no known allergies.   Social History   Social History  . Marital Status: Married    Spouse Name: N/A  . Number of Children: 3  . Years of Education: N/A   Occupational History  . Housewife    Social History Main Topics  . Smoking status: Never Smoker   . Smokeless tobacco: Never Used  . Alcohol Use: No  . Drug Use: No  . Sexual Activity: Yes    Birth Control/ Protection: Post-menopausal   Other Topics Concern  . None   Social History Narrative     Family History:  The patient's family history includes CAD in her brother and father; Cancer in her father; Diabetes in her  father; Heart attack in her brother and father; Hypertension in her father. There is no history of Stroke.   ROS:   Please see the history of present illness.    ROS All other systems reviewed and are negative.   Physical Exam:    VS:  BP 110/58 mmHg  Pulse 104  Ht 4' 11"  (1.499 m)  Wt 189 lb 12.8 oz (86.093 kg)  BMI 38.31 kg/m2   GEN: Well nourished, well developed, in no acute distress HEENT: normal Neck: no JVD, no masses Cardiac: Normal S1/S2, RRR; no murmurs, rubs, or gallops, no edema;  R groin without hematoma or bruitright wrist without hematoma or mass  Chest - bilateral sternoclavicular joints tender to palpation Respiratory:  clear to auscultation bilaterally; no wheezing, rhonchi or rales GI: soft, nontender, nondistended, + BS MS: no deformity or atrophy Skin: warm and dry, no rash Neuro:  no focal deficits  Psych: Alert and oriented x 3, normal affect  Wt Readings from Last 3 Encounters:  02/23/15 189 lb 12.8 oz (86.093 kg)  02/09/15 187 lb 6.3 oz (85 kg)  01/19/15 190 lb (86.183 kg)      Studies/Labs Reviewed:     EKG:  EKG is  ordered today.  The ekg ordered today demonstrates NSR, HR 85, normal axis, no ST changes, QTc 454 ms.  Recent Labs: 07/20/2014: ALT 18; TSH 3.02 02/09/2015: BUN 8; Creatinine, Ser 0.90;  Hemoglobin 8.7*; Platelets 407*; Potassium 4.6; Sodium 139   Recent Lipid Panel    Component Value Date/Time   CHOL 119 01/17/2015 0912   TRIG 124.0 01/17/2015 0912   HDL 35.40* 01/17/2015 0912   CHOLHDL 3 01/17/2015 0912   VLDL 24.8 01/17/2015 0912   LDLCALC 59 01/17/2015 0912    Additional studies/ records that were reviewed today include:   LHC 02/08/15 LAD ostial 40% LCx proximal stent 100% ISR, distal 90% RCA stent patent CTO PCI: 2.5 x 38 mm Synergy DES to the LCx  [facility] 12/26/14 LAD ostial 40% LCx proximal stent 100% ISR, distal 90% RCA mid stent 75% ISR, mid 80%, distal 90% PCI: 3 overlapping Synergy DES to the RCA  Myoview 12/12/14 Fixed large, severe basal to apical anterolateral and inferolateral perfusion defect. This suggests prior infarction. No significant ischemia. EF 39% with lateral hypokinesis. Intermediate risk study (primarily due to EF).   Myoview 2/14 Intermediate stress nuclear study with a large, severe, fixed lateral defect consistent with prior infarct; no ischemia. LV Ejection Fraction: 46%.  Echo 1/14 EF 45-50%, posterior lateral akinesis, grade 2 diastolic dysfunction, mild MR, trivial effusion   ASSESSMENT:     1. Coronary artery disease involving native coronary artery of native heart without angina pectoris   2. Other chest pain   3. Ischemic cardiomyopathy   4. Dyslipidemia, goal LDL below 100   5. Essential hypertension, benign     PLAN:     In order of problems listed above:  1. CAD: s/p prior PCI in 2011 in [place] with DES x 3.Recent LHC with high grade RCA and CTO of LCx.  She is now s/p DES x 3 to the RCA and CTO PCI of LCx with DES. She is overall doing well.  No recurrent angina.  Continue ASA, Plavix, statin, beta-blocker.  FU with Dr. Radford Pax in 6-8 weeks.   2. Chest Pain - She has pain over the SCJ bilaterally.  I suspect this has nothing to do with her PCI.  She did have a complex CTO PCI  and she notes worsening pain with  lying flat.  I doubt she has an effusion but will get limited echo to r/o pericardial effusion.  Will also get CXR and get comment on SCJ bilaterally.  If echo and CXR ok, FU with PCP.   3. Ischemic Cardiomyopathy: Continue beta-blocker, ACE inhibitor. If EF still down on limited echo, will need 90 day post PCI echo to recheck EF.  If EF < 35%, refer to EP for ICD.    4. Hyperlipidemia: Continue statin. LDL was 59 in 12/16.    5. HTN: Controlled.     Medication Adjustments/Labs and Tests Ordered: Current medicines are reviewed at length with the patient today.  Concerns regarding medicines are outlined above.  Medication changes, Labs and Tests ordered today are outlined in the Patient Instructions noted below. Patient Instructions  Medication Instructions:  Your physician recommends that you continue on your current medications as directed. Please refer to the Current Medication list given to you today.  Labwork: NONE  Testing/Procedures: 1. Your physician has requested that you have an LIMITED echocardiogram. Echocardiography is a painless test that uses sound waves to create images of your heart. It provides your doctor with information about the size and shape of your heart and how well your heart's chambers and valves are working. This procedure takes approximately one hour. There are no restrictions for this procedure.  2. YOU WILL NEED A CHEST X-RAY TODAY; PLEASE GO TO [facility]  Follow-Up: Your physician recommends that you schedule a follow-up appointment in: 6-8 WEEKS WITH DR. Radford Pax  Any Other Special Instructions Will Be Listed Below (If Applicable).  If you need a refill on your cardiac medications before your next appointment, please call your pharmacy.     Signed, Richardson Dopp, PA-C  02/23/2015 1:07 PM    [facility] [place], [place], [place]  [address] Phone: [phone]; Fax: [phone]

## 2015-02-23 ENCOUNTER — Encounter: Payer: Self-pay | Admitting: Physician Assistant

## 2015-02-23 ENCOUNTER — Ambulatory Visit
Admission: RE | Admit: 2015-02-23 | Discharge: 2015-02-23 | Disposition: A | Discharge status: Home / Self Care | Payer: Medicaid Other | Source: Ambulatory Visit | Attending: Physician Assistant | Admitting: Physician Assistant

## 2015-02-23 ENCOUNTER — Ambulatory Visit (INDEPENDENT_AMBULATORY_CARE_PROVIDER_SITE_OTHER): Payer: Medicaid Other | Admitting: Physician Assistant

## 2015-02-23 VITALS — BP 110/58 | HR 104 | Ht 59.0 in | Wt 189.8 lb

## 2015-02-23 DIAGNOSIS — E785 Hyperlipidemia, unspecified: Secondary | ICD-10-CM

## 2015-02-23 DIAGNOSIS — I2510 Atherosclerotic heart disease of native coronary artery without angina pectoris: Secondary | ICD-10-CM

## 2015-02-23 DIAGNOSIS — R0789 Other chest pain: Secondary | ICD-10-CM

## 2015-02-23 DIAGNOSIS — I255 Ischemic cardiomyopathy: Secondary | ICD-10-CM | POA: Diagnosis not present

## 2015-02-23 DIAGNOSIS — I10 Essential (primary) hypertension: Secondary | ICD-10-CM

## 2015-02-23 NOTE — Patient Instructions (Addendum)
Medication Instructions:  Your physician recommends that you continue on your current medications as directed. Please refer to the Current Medication list given to you today.  Labwork: NONE  Testing/Procedures: 1. Your physician has requested that you have an LIMITED echocardiogram. Echocardiography is a painless test that uses sound waves to create images of your heart. It provides your doctor with information about the size and shape of your heart and how well your heart's chambers and valves are working. This procedure takes approximately one hour. There are no restrictions for this procedure.  2. YOU WILL NEED A CHEST X-RAY TODAY; PLEASE GO TO [facility]  Follow-Up: Your physician recommends that you schedule a follow-up appointment in: 6-8 WEEKS WITH DR. Radford Pax  Any Other Special Instructions Will Be Listed Below (If Applicable).  If you need a refill on your cardiac medications before your next appointment, please call your pharmacy.

## 2015-02-24 ENCOUNTER — Telehealth: Payer: Self-pay | Admitting: *Deleted

## 2015-02-24 NOTE — Telephone Encounter (Signed)
DPR for husband who has been notified of CXR results by phone with verbal understanding.

## 2015-03-02 ENCOUNTER — Encounter: Payer: Self-pay | Admitting: Physician Assistant

## 2015-03-02 ENCOUNTER — Other Ambulatory Visit: Payer: Self-pay

## 2015-03-02 ENCOUNTER — Ambulatory Visit (HOSPITAL_COMMUNITY): Payer: Medicaid Other | Attending: Cardiology

## 2015-03-02 ENCOUNTER — Other Ambulatory Visit: Payer: Self-pay | Admitting: Physician Assistant

## 2015-03-02 DIAGNOSIS — I517 Cardiomegaly: Secondary | ICD-10-CM | POA: Insufficient documentation

## 2015-03-02 DIAGNOSIS — E785 Hyperlipidemia, unspecified: Secondary | ICD-10-CM | POA: Insufficient documentation

## 2015-03-02 DIAGNOSIS — R29898 Other symptoms and signs involving the musculoskeletal system: Secondary | ICD-10-CM | POA: Diagnosis not present

## 2015-03-02 DIAGNOSIS — I10 Essential (primary) hypertension: Secondary | ICD-10-CM | POA: Insufficient documentation

## 2015-03-02 DIAGNOSIS — I2510 Atherosclerotic heart disease of native coronary artery without angina pectoris: Secondary | ICD-10-CM

## 2015-03-02 DIAGNOSIS — E119 Type 2 diabetes mellitus without complications: Secondary | ICD-10-CM | POA: Insufficient documentation

## 2015-03-02 DIAGNOSIS — I071 Rheumatic tricuspid insufficiency: Secondary | ICD-10-CM | POA: Insufficient documentation

## 2015-03-03 ENCOUNTER — Telehealth: Payer: Self-pay | Admitting: *Deleted

## 2015-03-03 NOTE — Telephone Encounter (Signed)
Lmptcb for echo results

## 2015-03-03 NOTE — Telephone Encounter (Signed)
Lmptcb x 2. I did lmom of test results DPR states ok. I did however ask for ptcb on Monday 2/13 to let me know they got the message and if they have any questions.

## 2015-03-06 NOTE — Telephone Encounter (Signed)
DPR on file for husband who has been notified of limited echo results by phone with verbal understanding.

## 2015-03-09 ENCOUNTER — Encounter (HOSPITAL_COMMUNITY)
Admission: RE | Admit: 2015-03-09 | Discharge: 2015-03-09 | Disposition: A | Discharge status: Home / Self Care | Payer: Medicaid Other | Source: Ambulatory Visit | Attending: Cardiology | Admitting: Cardiology

## 2015-03-09 NOTE — Progress Notes (Signed)
Cardiac Rehab Medication Review by a Pharmacist  Does the patient  feel that his/her medications are working for him/her?  yes  Has the patient been experiencing any side effects to the medications prescribed?  no  Does the patient measure his/her own blood pressure or blood glucose at home?  no   Does the patient have any problems obtaining medications due to transportation or finances?   yes  Understanding of regimen: excellent Understanding of indications: good Potential of compliance: good    Pharmacist comments: Spoke w/ Kathleen Holt with the help of her husband and a Optometrist. We reviewed all of her medications, she knows exactly how she takes everything and has a decent understanding of the indications. I discussed some medication assistance programs and other assistance programs with her husband and referred him to the [facility] Department. I provided him with the phone number and address. I updated her medication list appropriately.   Governor Specking, PharmD Clinical Pharmacy Resident Pager: [phone] 03/09/2015 10:41 AM

## 2015-03-10 NOTE — Progress Notes (Addendum)
Kathleen Holt 60 y.o. female Nutrition Note Spoke with pt and pt's husband via interpreter. Nutrition Plan and Nutrition Survey goals reviewed with pt. Pt is following Step 2 of the Therapeutic Lifestyle Changes diet. Pt is following a lacto-vegetarian diet. Pt wants to lose wt. Pt has been trying to lose wt by decreasing portion sizes and increasing exercise. Wt loss tips reviewed. Pt is diabetic. Last A1c indicates blood glucose not optimally controlled. [name] feels DM "well controlled." Pt checks fasting CBG's daily. Fasting CBG's reportedly 90-105 mg/dL. Pt expressed understanding of the information reviewed. Pt aware of nutrition education classes offered. Due to language barrier, pt requested the nutrition class handouts for her husband to read and translate to her.  Lab Results  Component Value Date   HGBA1C 7.4* 01/17/2015   Wt Readings from Last 3 Encounters:  02/23/15 189 lb 12.8 oz (86.093 kg)  02/09/15 187 lb 6.3 oz (85 kg)  01/19/15 190 lb (86.183 kg)    Nutrition Diagnosis ? Food-and nutrition-related knowledge deficit related to lack of exposure to information as related to diagnosis of: ? CVD ? DM ? Obesity related to excessive energy intake as evidenced by a BMI of 38.4  Nutrition RX/ Estimated Daily Nutrition Needs for: wt loss  1250-1500 Kcal, 35-40 gm fat, 9-12 gm sat fat, 1.2-1.5 gm trans-fat, <1500 mg sodium, 150-175 gm CHO   Nutrition Intervention ? Pt's individual nutrition plan reviewed with pt. ? Benefits of adopting Therapeutic Lifestyle Changes discussed when Medficts reviewed. ? Pt to attend the Portion Distortion class ? Pt to attend the Diabetes Q & A class  ? Pt given handouts for: ? Nutrition I class ? Nutrition II class ? Diabetes Blitz Class  ? Continue client-centered nutrition education by RD, as part of interdisciplinary care. Goal(s) ? Pt to identify food quantities necessary to achieve weight loss of 6-24 lb (2.7-10.9 kg) at graduation  from cardiac rehab.  ? Use pre-meal and post-meal CBG's and A1c to determine whether adjustments in food/meal planning will be beneficial or if any meds need to be combined with nutrition therapy. Monitor and Evaluate progress toward nutrition goal with team. Derek Mound, M.Ed, RD, LDN, CDE 03/10/2015 11:52 AM Late entry: patient seen 03/09/15 during orientation visit

## 2015-03-13 ENCOUNTER — Encounter (HOSPITAL_COMMUNITY): Payer: Medicaid Other

## 2015-03-14 ENCOUNTER — Telehealth (HOSPITAL_COMMUNITY): Payer: Self-pay | Admitting: *Deleted

## 2015-03-15 ENCOUNTER — Encounter (HOSPITAL_COMMUNITY): Payer: Medicaid Other

## 2015-03-17 ENCOUNTER — Encounter (HOSPITAL_COMMUNITY): Payer: Medicaid Other

## 2015-03-20 ENCOUNTER — Encounter (HOSPITAL_COMMUNITY): Payer: Medicaid Other

## 2015-03-21 ENCOUNTER — Other Ambulatory Visit: Payer: Self-pay | Admitting: Family Medicine

## 2015-03-22 ENCOUNTER — Encounter (HOSPITAL_COMMUNITY): Payer: Medicaid Other

## 2015-03-24 ENCOUNTER — Encounter (HOSPITAL_COMMUNITY): Payer: Medicaid Other

## 2015-03-27 ENCOUNTER — Encounter (HOSPITAL_COMMUNITY): Payer: Medicaid Other

## 2015-03-29 ENCOUNTER — Encounter (HOSPITAL_COMMUNITY): Payer: Medicaid Other

## 2015-03-31 ENCOUNTER — Encounter (HOSPITAL_COMMUNITY): Payer: Medicaid Other

## 2015-04-03 ENCOUNTER — Encounter (HOSPITAL_COMMUNITY): Payer: Medicaid Other

## 2015-04-05 ENCOUNTER — Encounter (HOSPITAL_COMMUNITY): Payer: Medicaid Other

## 2015-04-07 ENCOUNTER — Encounter (HOSPITAL_COMMUNITY): Payer: Medicaid Other

## 2015-04-10 ENCOUNTER — Encounter (HOSPITAL_COMMUNITY): Payer: Medicaid Other

## 2015-04-12 ENCOUNTER — Encounter (HOSPITAL_COMMUNITY): Payer: Medicaid Other

## 2015-04-13 ENCOUNTER — Encounter: Payer: Self-pay | Admitting: Family Medicine

## 2015-04-13 ENCOUNTER — Other Ambulatory Visit: Payer: Self-pay | Admitting: Family Medicine

## 2015-04-13 ENCOUNTER — Ambulatory Visit: Payer: Medicaid Other | Attending: Internal Medicine | Admitting: Family Medicine

## 2015-04-13 VITALS — BP 126/73 | HR 84 | Temp 97.7°F | Resp 16 | Ht 59.0 in | Wt 192.0 lb

## 2015-04-13 DIAGNOSIS — Z79899 Other long term (current) drug therapy: Secondary | ICD-10-CM | POA: Diagnosis not present

## 2015-04-13 DIAGNOSIS — Z1159 Encounter for screening for other viral diseases: Secondary | ICD-10-CM

## 2015-04-13 DIAGNOSIS — I10 Essential (primary) hypertension: Secondary | ICD-10-CM | POA: Diagnosis not present

## 2015-04-13 DIAGNOSIS — E1149 Type 2 diabetes mellitus with other diabetic neurological complication: Secondary | ICD-10-CM | POA: Diagnosis not present

## 2015-04-13 DIAGNOSIS — Z114 Encounter for screening for human immunodeficiency virus [HIV]: Secondary | ICD-10-CM | POA: Diagnosis not present

## 2015-04-13 DIAGNOSIS — I25119 Atherosclerotic heart disease of native coronary artery with unspecified angina pectoris: Secondary | ICD-10-CM | POA: Diagnosis not present

## 2015-04-13 DIAGNOSIS — Z1231 Encounter for screening mammogram for malignant neoplasm of breast: Secondary | ICD-10-CM | POA: Diagnosis not present

## 2015-04-13 DIAGNOSIS — Z794 Long term (current) use of insulin: Secondary | ICD-10-CM | POA: Insufficient documentation

## 2015-04-13 DIAGNOSIS — Z7982 Long term (current) use of aspirin: Secondary | ICD-10-CM | POA: Insufficient documentation

## 2015-04-13 DIAGNOSIS — Z7984 Long term (current) use of oral hypoglycemic drugs: Secondary | ICD-10-CM | POA: Insufficient documentation

## 2015-04-13 DIAGNOSIS — Z0000 Encounter for general adult medical examination without abnormal findings: Secondary | ICD-10-CM

## 2015-04-13 LAB — HIV ANTIBODY (ROUTINE TESTING W REFLEX): HIV: NONREACTIVE

## 2015-04-13 LAB — GLUCOSE, POCT (MANUAL RESULT ENTRY): POC GLUCOSE: 182 mg/dL — AB (ref 70–99)

## 2015-04-13 LAB — POCT GLYCOSYLATED HEMOGLOBIN (HGB A1C): Hemoglobin A1C: 7.3

## 2015-04-13 MED ORDER — INSULIN ASPART 100 UNIT/ML FLEXPEN: 12.0000 [IU] | PEN_INJECTOR | Freq: Three times a day (TID) | SUBCUTANEOUS | Status: DC

## 2015-04-13 MED ORDER — CARVEDILOL 3.125 MG PO TABS: 3.1250 mg | ORAL_TABLET | Freq: Two times a day (BID) | ORAL | Status: DC

## 2015-04-13 MED ORDER — GABAPENTIN 400 MG PO CAPS: 800.0000 mg | ORAL_CAPSULE | Freq: Three times a day (TID) | ORAL | Status: DC

## 2015-04-13 MED ORDER — INSULIN GLARGINE 100 UNIT/ML SOLOSTAR PEN: 30.0000 [IU] | PEN_INJECTOR | Freq: Every morning | SUBCUTANEOUS | Status: DC

## 2015-04-13 MED ORDER — LISINOPRIL 2.5 MG PO TABS: 2.5000 mg | ORAL_TABLET | Freq: Every day | ORAL | Status: DC

## 2015-04-13 MED ORDER — ATORVASTATIN CALCIUM 20 MG PO TABS: 20.0000 mg | ORAL_TABLET | Freq: Every day | ORAL | Status: DC

## 2015-04-13 MED ORDER — LIRAGLUTIDE 18 MG/3ML ~~LOC~~ SOPN: 1.2000 mg | PEN_INJECTOR | Freq: Every day | SUBCUTANEOUS | Status: DC

## 2015-04-13 NOTE — Progress Notes (Signed)
F/U DM  Glucose running 165-264 Taking medication as prescribed No pain today today  No tobacco user  No suicidal thoughts in the past two weeks

## 2015-04-13 NOTE — Assessment & Plan Note (Signed)
A; well controlled diabetes, obese patient P: Increase victoza to 1.2 mg q HS and lower novolog to 12  TID to promote weight loss

## 2015-04-13 NOTE — Patient Instructions (Signed)
Kathleen Holt was seen today for diabetes.  Diagnoses and all orders for this visit:  DM (diabetes mellitus), type 2 with neurological complications ([place]) -     POCT glycosylated hemoglobin (Hb A1C) -     POCT glucose (manual entry) -     Liraglutide (VICTOZA) 18 MG/3ML SOPN; Inject 0.2 mLs (1.2 mg total) into the skin at bedtime. -     Ambulatory referral to Ophthalmology -     insulin aspart (NOVOLOG FLEXPEN) 100 UNIT/ML FlexPen; Inject 12 Units into the skin 3 (three) times daily with meals.  Screening for HIV (human immunodeficiency virus) -     HIV antibody (with reflex)  Need for hepatitis C screening test -     Hepatitis C antibody, reflex  Visit for screening mammogram -     MM DIGITAL SCREENING BILATERAL; Future    Increase walking time by 3 minutes every week to reduce weight and improve leg weakness Decrease novolog to 12 U with meals to help with weight loss Increase victoza to 1.2 mg nightly   F/u in 3 months for diabetes   Dr. Adrian Blackwater

## 2015-04-13 NOTE — Progress Notes (Signed)
Subjective:  Patient ID: Kathleen Holt, female    DOB: 04-21-1955  Age: 60 y.o. MRN: [id number]  CC: Diabetes   HPI Kathleen Holt presents for   1. CHRONIC DIABETES  Disease Monitoring  Blood Sugar Ranges:  80-264  Polyuria: no   Visual problems: yes, trouble seeing from R eye    Medication Compliance: yes  Medication Side Effects  Hypoglycemia: no   Preventitive Health Care  Eye Exam:   Foot Exam: done today   Diet pattern: low carb  Exercise: walks 10-15 mins per day   Social History  Substance Use Topics  . Smoking status: Never Smoker   . Smokeless tobacco: Never Used  . Alcohol Use: No    Outpatient Prescriptions Prior to Visit  Medication Sig Dispense Refill  . ACCU-CHEK SOFTCLIX LANCETS lancets Use as instructed to check blood sugar 2 times per day dx code E11.40 100 each 3  . aspirin 81 MG tablet Take 1 tablet (81 mg total) by mouth daily. 30 tablet 11  . atorvastatin (LIPITOR) 20 MG tablet Take 1 tablet (20 mg total) by mouth daily. 30 tablet 5  . Blood Glucose Monitoring Suppl (ACCU-CHEK AVIVA PLUS) W/DEVICE KIT Use to check blood sugar 2 times per day dx code E11.40 1 kit 1  . carvedilol (COREG) 3.125 MG tablet Take 1 tablet (3.125 mg total) by mouth 2 (two) times daily with a meal. 60 tablet 5  . clopidogrel (PLAVIX) 75 MG tablet Take 1 tablet (75 mg total) by mouth daily. 90 tablet 3  . docusate sodium (COLACE) 100 MG capsule Take 1 capsule (100 mg total) by mouth daily. (Patient taking differently: Take 100 mg by mouth at bedtime. ) 10 capsule 0  . ferrous sulfate 325 (65 FE) MG tablet Take 1 tablet (325 mg total) by mouth 2 (two) times daily with a meal. 60 tablet 3  . gabapentin (NEURONTIN) 400 MG capsule Take 2 capsules (800 mg total) by mouth 3 (three) times daily. 240 capsule 5  . glucose blood (ACCU-CHEK AVIVA PLUS) test strip Use as instructed to check blood sugar 2 times per day dx code E11.40 100 each 3  . insulin aspart (NOVOLOG FLEXPEN) 100  UNIT/ML FlexPen Inject 14 Units into the skin 3 (three) times daily with meals. 15 pen 2  . Insulin Glargine (LANTUS SOLOSTAR) 100 UNIT/ML Solostar Pen Inject 30 Units into the skin every morning.     . Insulin Pen Needle 31G X 5 MM MISC Use 5 per day to inject insulin 150 each 2  . Liraglutide (VICTOZA) 18 MG/3ML SOPN Inject 0.9 mg into the skin at bedtime.     Marland Kitchen lisinopril (PRINIVIL,ZESTRIL) 2.5 MG tablet Take 1 tablet (2.5 mg total) by mouth daily. 30 tablet 5  . metFORMIN (GLUCOPHAGE) 1000 MG tablet Take 1 tablet (1,000 mg total) by mouth 2 (two) times daily with a meal. 60 tablet 5  . nitroGLYCERIN (NITROSTAT) 0.4 MG SL tablet Place 1 tablet (0.4 mg total) under the tongue every 5 (five) minutes x 3 doses as needed for chest pain. 25 tablet 3  . Polyethyl Glycol-Propyl Glycol 0.4-0.3 % SOLN Place 1 drop into both eyes every 3 (three) hours as needed (FOR DRYNESS).     . Vitamin D, Ergocalciferol, (DRISDOL) 50000 units CAPS capsule Take 1 capsule (50,000 Units total) by mouth every 30 (thirty) days. 4 capsule 11   No facility-administered medications prior to visit.    ROS Review of Systems  Constitutional: Negative for  fever and chills.  Eyes: Negative for visual disturbance.  Respiratory: Negative for shortness of breath.   Cardiovascular: Negative for chest pain.  Gastrointestinal: Negative for abdominal pain and blood in stool.  Musculoskeletal: Negative for back pain and arthralgias.  Skin: Negative for rash.  Allergic/Immunologic: Negative for immunocompromised state.  Neurological: Positive for weakness (in LE ) and numbness (in LE ).  Hematological: Negative for adenopathy. Does not bruise/bleed easily.  Psychiatric/Behavioral: Negative for suicidal ideas and dysphoric mood.    Objective:  BP 126/73 mmHg  Pulse 84  Temp(Src) 97.7 F (36.5 C) (Oral)  Resp 16  Ht 4' 11" (1.499 m)  Wt 192 lb (87.091 kg)  BMI 38.76 kg/m2  SpO2 97%  BP/Weight 04/13/2015 02/23/2015  33/54/5625  Systolic BP 638 937 342  Diastolic BP 73 58 62  Wt. (Lbs) 192 189.8 190  BMI 38.76 38.31 37.11   Physical Exam  Constitutional: She is oriented to person, place, and time. She appears well-developed and well-nourished. No distress.  HENT:  Head: Normocephalic and atraumatic.  Cardiovascular: Normal rate, regular rhythm, normal heart sounds and intact distal pulses.   Pulmonary/Chest: Effort normal and breath sounds normal.  Musculoskeletal: She exhibits no edema.  Neurological: She is alert and oriented to person, place, and time.  Skin: Skin is warm and dry. No rash noted.  Psychiatric: She has a normal mood and affect.   Lab Results  Component Value Date   HGBA1C 7.4* 01/17/2015   Lab Results  Component Value Date   HGBA1C 7.3 04/13/2015    CBG 183   Assessment & Plan:   Grady was seen today for diabetes.  Diagnoses and all orders for this visit:  DM (diabetes mellitus), type 2 with neurological complications ([place]) -     POCT glycosylated hemoglobin (Hb A1C) -     POCT glucose (manual entry) -     Liraglutide (VICTOZA) 18 MG/3ML SOPN; Inject 0.2 mLs (1.2 mg total) into the skin at bedtime. -     Ambulatory referral to Ophthalmology -     insulin aspart (NOVOLOG FLEXPEN) 100 UNIT/ML FlexPen; Inject 12 Units into the skin 3 (three) times daily with meals. -     gabapentin (NEURONTIN) 400 MG capsule; Take 2 capsules (800 mg total) by mouth 3 (three) times daily. -     atorvastatin (LIPITOR) 20 MG tablet; Take 1 tablet (20 mg total) by mouth daily. -     Insulin Glargine (LANTUS SOLOSTAR) 100 UNIT/ML Solostar Pen; Inject 30 Units into the skin every morning.  Screening for HIV (human immunodeficiency virus) -     HIV antibody (with reflex)  Need for hepatitis C screening test -     Hepatitis C antibody, reflex  Visit for screening mammogram -     MM DIGITAL SCREENING BILATERAL; Future  Healthcare maintenance -     Ambulatory referral to  Dentistry  Coronary artery disease with unspecified angina pectoris -     carvedilol (COREG) 3.125 MG tablet; Take 1 tablet (3.125 mg total) by mouth 2 (two) times daily with a meal. -     atorvastatin (LIPITOR) 20 MG tablet; Take 1 tablet (20 mg total) by mouth daily.  Essential hypertension, benign -     lisinopril (PRINIVIL,ZESTRIL) 2.5 MG tablet; Take 1 tablet (2.5 mg total) by mouth daily.  Other orders -     Cancel: Pneumococcal conjugate vaccine 13-valent   No orders of the defined types were placed in this encounter.  Follow-up: No Follow-up on file.   Boykin Nearing MD

## 2015-04-14 ENCOUNTER — Ambulatory Visit
Admission: RE | Admit: 2015-04-14 | Discharge: 2015-04-14 | Disposition: A | Discharge status: Home / Self Care | Payer: Medicaid Other | Source: Ambulatory Visit | Attending: Family Medicine | Admitting: Family Medicine

## 2015-04-14 ENCOUNTER — Encounter (HOSPITAL_COMMUNITY): Payer: Medicaid Other

## 2015-04-14 DIAGNOSIS — Z1231 Encounter for screening mammogram for malignant neoplasm of breast: Secondary | ICD-10-CM

## 2015-04-14 LAB — HEPATITIS C ANTIBODY: HCV Ab: NEGATIVE

## 2015-04-17 ENCOUNTER — Other Ambulatory Visit (INDEPENDENT_AMBULATORY_CARE_PROVIDER_SITE_OTHER): Payer: Medicaid Other

## 2015-04-17 ENCOUNTER — Encounter (HOSPITAL_COMMUNITY): Payer: Medicaid Other

## 2015-04-17 DIAGNOSIS — D649 Anemia, unspecified: Secondary | ICD-10-CM | POA: Diagnosis not present

## 2015-04-17 DIAGNOSIS — Z794 Long term (current) use of insulin: Secondary | ICD-10-CM

## 2015-04-17 DIAGNOSIS — E1165 Type 2 diabetes mellitus with hyperglycemia: Secondary | ICD-10-CM | POA: Diagnosis not present

## 2015-04-17 LAB — BASIC METABOLIC PANEL
BUN: 13 mg/dL (ref 6–23)
CHLORIDE: 103 meq/L (ref 96–112)
CO2: 28 mEq/L (ref 19–32)
CREATININE: 0.96 mg/dL (ref 0.40–1.20)
Calcium: 9 mg/dL (ref 8.4–10.5)
GFR: 62.6 mL/min (ref >60.00)
Glucose, Bld: 139 mg/dL — ABNORMAL HIGH (ref 70–99)
POTASSIUM: 4.7 meq/L (ref 3.5–5.1)
SODIUM: 136 meq/L (ref 135–145)

## 2015-04-17 LAB — HEMOGLOBIN A1C: HEMOGLOBIN A1C: 7.6 % — AB (ref 4.6–6.5)

## 2015-04-17 LAB — IBC PANEL
IRON: 35 ug/dL — AB (ref 42–145)
Saturation Ratios: 7.8 % — ABNORMAL LOW (ref 20.0–50.0)
Transferrin: 322 mg/dL (ref 212.0–360.0)

## 2015-04-17 LAB — VITAMIN B12: VITAMIN B 12: 133 pg/mL — AB (ref 211–911)

## 2015-04-19 ENCOUNTER — Encounter (HOSPITAL_COMMUNITY): Payer: Medicaid Other

## 2015-04-20 ENCOUNTER — Ambulatory Visit (INDEPENDENT_AMBULATORY_CARE_PROVIDER_SITE_OTHER): Payer: Medicaid Other | Admitting: Endocrinology

## 2015-04-20 ENCOUNTER — Other Ambulatory Visit: Payer: Self-pay | Admitting: *Deleted

## 2015-04-20 ENCOUNTER — Encounter: Payer: Self-pay | Admitting: Endocrinology

## 2015-04-20 ENCOUNTER — Telehealth: Payer: Self-pay | Admitting: *Deleted

## 2015-04-20 VITALS — BP 148/78 | HR 87 | Temp 97.8°F | Resp 16 | Ht 59.0 in | Wt 193.0 lb

## 2015-04-20 DIAGNOSIS — D649 Anemia, unspecified: Secondary | ICD-10-CM | POA: Diagnosis not present

## 2015-04-20 DIAGNOSIS — Z794 Long term (current) use of insulin: Secondary | ICD-10-CM | POA: Diagnosis not present

## 2015-04-20 DIAGNOSIS — E1165 Type 2 diabetes mellitus with hyperglycemia: Secondary | ICD-10-CM

## 2015-04-20 MED ORDER — ACCU-CHEK AVIVA PLUS W/DEVICE KIT: PACK | Status: AC

## 2015-04-20 MED ORDER — CANAGLIFLOZIN 100 MG PO TABS: ORAL_TABLET | ORAL | Status: DC

## 2015-04-20 NOTE — Telephone Encounter (Signed)
Date of birth verified by pt  Lab results given  Neg HIV, Neg Hep C Pt verbalized understanding

## 2015-04-20 NOTE — Patient Instructions (Signed)
Novolog 16 at dinner, 14 at other meals  When starting Invokana reduce Lantus to 26 and Novolog by 2 units  Check blood sugars on waking up 3  times a week Also check blood sugars about 2 hours after a meal and do this after different meals by rotation  Recommended blood sugar levels on waking up is 90-130 and about 2 hours after meal is 130-160  Please bring your blood sugar monitor to each visit, thank you  B12 OTC 2000ug daily  Iron every 2 days   Miralax for constipation

## 2015-04-20 NOTE — Progress Notes (Signed)
Patient ID: Kathleen Holt, female   DOB: 1955-07-14, 60 y.o.   MRN: [id number]    Reason for Appointment: Follow-up of type 2 Diabetes  History of Present Illness:          Diagnosis: Type 2 diabetes mellitus, date of diagnosis: 2005        Past history: The diabetes was diagnosed about 10 years ago and she was probably treated with metformin and other oral agents for at least 6 years. Previously she had tried metformin, Januvia and Amaryl When she was admitted for coronary disease in 2011 in [place] she was switched to insulin and oral agents. Not clear how her control has been in the past She has been on Lantus and NovoLog insulin since 2011 She also had tried Byetta with her insulin for sometime but not clear if it was helping her  Because of her poor control and persistently high A1c of 10.1% in 04/2013 her metformin was increased to maximum dose. Also she was started on Victoza but she was unable to tolerate the 1.2 mg dosage since she had decreased appetite and abdominal distress  Recent history:   INSULIN regimen is described as:  Lantus 30 at am, NovoLog 14/16 units ac tid  Her A1c continues to be over 7%   She did not tolerate 1.2 mg Victoza previously and although she was told to dial 5 clicks beyond 0.6 Mark but again she is dialing only 3 clicks since this tends to cause decreased appetite  Currently  on basal bolus insulin regimen with Lantus insulin in the mornings  Current blood sugar patterns and problems identified:  She is checking blood sugars either in the morning or after supper  Since her monitor has morning and evening readings flipped backwards difficult to analyze her readings  She has consistently high POSTPRANDIAL readings after supper now  She was told to adjust her mealtime doses based on her meal size and she does take 2 units more when eating rice  A1c is relatively higher at 7.6,  Still not able to exercise  Her weight gradually  continues to increase despite No hypoglycemia and reportedly low calorie intake overall       Oral hypoglycemic drugs the patient is taking are: Metformin 2 g daily      Side effects from medications have been: abdominal distress and anorexia with 1.2 Victoza  Glucose monitoring:  done once or twice daily        Glucometer: Accu-Chek  Blood Glucose readings from Download show  Mean values apply above for all meters except median for One Touch  PRE-MEAL Fasting Lunch Dinner Bedtime Overall  Glucose range: 88-167  ?    191-265    Mean/median: 125    189  158     Glycemic control  Lab Results  Component Value Date   HGBA1C 7.6* 04/17/2015   HGBA1C 7.3 04/13/2015   HGBA1C 7.4* 01/17/2015   Lab Results  Component Value Date   MICROALBUR 2.7* 04/13/2014   LDLCALC 59 01/17/2015   CREATININE 0.96 04/17/2015    Self-care: The diet that the patient has been following is: None, usually vegetarian, Small portions       Exercise: a little walking around her apartment or going up steps         Dietician visit:  none              Compliance with the medical regimen: Fair  Weight history:  Wt Readings  from Last 3 Encounters:  04/20/15 193 lb (87.544 kg)  04/13/15 192 lb (87.091 kg)  02/23/15 189 lb 12.8 oz (86.093 kg)     Lab on 04/17/2015  Component Date Value Ref Range Status  . Iron 04/17/2015 35* 42 - 145 ug/dL Final  . Transferrin 04/17/2015 322.0  212.0 - 360.0 mg/dL Final  . Saturation Ratios 04/17/2015 7.8* 20.0 - 50.0 % Final  . Vitamin B-12 04/17/2015 133* 211 - 911 pg/mL Final  . Hgb A1c MFr Bld 04/17/2015 7.6* 4.6 - 6.5 % Final   Glycemic Control Guidelines for People with Diabetes:Non Diabetic:  <6%Goal of Therapy: <7%Additional Action Suggested:  >8%   . Sodium 04/17/2015 136  135 - 145 mEq/L Final  . Potassium 04/17/2015 4.7  3.5 - 5.1 mEq/L Final  . Chloride 04/17/2015 103  96 - 112 mEq/L Final  . CO2 04/17/2015 28  19 - 32 mEq/L Final  . Glucose, Bld  04/17/2015 139* 70 - 99 mg/dL Final  . BUN 04/17/2015 13  6 - 23 mg/dL Final  . Creatinine, Ser 04/17/2015 0.96  0.40 - 1.20 mg/dL Final  . Calcium 04/17/2015 9.0  8.4 - 10.5 mg/dL Final  . GFR 04/17/2015 62.60  >60.00 mL/min Final      Medication List       This list is accurate as of: 04/20/15 12:43 PM.  Always use your most recent med list.               ACCU-CHEK AVIVA PLUS w/Device Kit  Use to check blood sugar 2 times per day dx code E11.40     ACCU-CHEK SOFTCLIX LANCETS lancets  Use as instructed to check blood sugar 2 times per day dx code E11.40     aspirin 81 MG tablet  Take 1 tablet (81 mg total) by mouth daily.     atorvastatin 20 MG tablet  Commonly known as:  LIPITOR  Take 1 tablet (20 mg total) by mouth daily.     canagliflozin 100 MG Tabs tablet  Commonly known as:  INVOKANA  1 tablet before breakfast     carvedilol 3.125 MG tablet  Commonly known as:  COREG  Take 1 tablet (3.125 mg total) by mouth 2 (two) times daily with a meal.     clopidogrel 75 MG tablet  Commonly known as:  PLAVIX  Take 1 tablet (75 mg total) by mouth daily.     docusate sodium 100 MG capsule  Commonly known as:  COLACE  Take 1 capsule (100 mg total) by mouth daily.     ferrous sulfate 325 (65 FE) MG tablet  Take 1 tablet (325 mg total) by mouth 2 (two) times daily with a meal.     gabapentin 400 MG capsule  Commonly known as:  NEURONTIN  Take 2 capsules (800 mg total) by mouth 3 (three) times daily.     glucose blood test strip  Commonly known as:  ACCU-CHEK AVIVA PLUS  Use as instructed to check blood sugar 2 times per day dx code E11.40     insulin aspart 100 UNIT/ML FlexPen  Commonly known as:  NOVOLOG FLEXPEN  Inject 12 Units into the skin 3 (three) times daily with meals.     Insulin Glargine 100 UNIT/ML Solostar Pen  Commonly known as:  LANTUS SOLOSTAR  Inject 30 Units into the skin every morning.     Insulin Pen Needle 31G X 5 MM Misc  Use 5 per day to  inject insulin  Liraglutide 18 MG/3ML Sopn  Commonly known as:  VICTOZA  Inject 0.2 mLs (1.2 mg total) into the skin at bedtime.     lisinopril 2.5 MG tablet  Commonly known as:  PRINIVIL,ZESTRIL  Take 1 tablet (2.5 mg total) by mouth daily.     metFORMIN 1000 MG tablet  Commonly known as:  GLUCOPHAGE  Take 1 tablet (1,000 mg total) by mouth 2 (two) times daily with a meal.     nitroGLYCERIN 0.4 MG SL tablet  Commonly known as:  NITROSTAT  Place 1 tablet (0.4 mg total) under the tongue every 5 (five) minutes x 3 doses as needed for chest pain.     Polyethyl Glycol-Propyl Glycol 0.4-0.3 % Soln  Place 1 drop into both eyes every 3 (three) hours as needed (FOR DRYNESS).     Vitamin D (Ergocalciferol) 50000 units Caps capsule  Commonly known as:  DRISDOL  Take 1 capsule (50,000 Units total) by mouth every 30 (thirty) days.        Allergies: No Known Allergies  Past Medical History  Diagnosis Date  . Coronary artery disease     a. s/p stenting 11/2009 [facility] b. 12/26/2014: Dist RCA 90% stenosed and 100% stenosis of the Prox to Mid-Cx. 3 DES placed to RCA.   Marland Kitchen Hyperlipidemia   . Ejection fraction      EF 45-50%,  echo, February 10, 2012, akinesis posterior lateral wall, diastolic dysfunction, mild mitral regurgitation,  . Mitral regurgitation     Mild, echo, January, 2014  . Type II diabetes mellitus ([place])   . Diabetic neuropathy ([place])   . Myocardial infarction ([place]) 2011  . Chronic lower back pain   . Chronic constipation   . History of echocardiogram     a. Echo 2/17:  EF 45-50%, inf-lat AK, Gr 1 DD    Past Surgical History  Procedure Laterality Date  . Eye surgery    . Vaginal hysterectomy    . Cataract extraction w/ intraocular lens  implant, bilateral    . Coronary angioplasty with stent placement  2011; 12/26/2014  . Cardiac catheterization N/A 12/26/2014    Procedure: Left Heart Cath and Coronary Angiography;  Surgeon: Jettie Booze, MD;  Location: [place] [facility];  Service: Cardiovascular;  Laterality: N/A;  . Cardiac catheterization N/A 12/26/2014    Procedure: Coronary Stent Intervention;  Surgeon: Jettie Booze, MD;  Location: [place] [facility];  Service: Cardiovascular; 3 overlapping Synergy drug-eluting stents, 2.25 x 16, 2.5 x 38 and 3.0 x 32 to the RCA  . Cardiac catheterization N/A 02/08/2015    Procedure: Coronary/Bypass Graft CTO Intervention;  Surgeon: Jettie Booze, MD;  Location: [place] [facility];  Service: Cardiovascular;  Laterality: N/A;  . Cardiac catheterization  02/08/2015    Procedure: Left Heart Cath and Coronary Angiography;  Surgeon: Jettie Booze, MD;  Location: [place] [facility];  Service: Cardiovascular;;    Family History  Problem Relation Age of Onset  . CAD Father     MI at age 45  . Diabetes Father   . Hypertension Father   . Cancer Father   . CAD Brother   . Heart attack Father   . Heart attack Brother   . Stroke Neg Hx     Social History:  reports that she has never smoked. She has never used smokeless tobacco. She reports that she does not drink alcohol or use illicit drugs.    Review  of Systems   Eye exam: Last in  3/16  She complains of shortness of breath on exertion and has been significantly anemic The cause of iron deficiency is unknown She is taking iron once a day but is complaining about constipation again Again this has not been addressed by her PCP  She also appears to have significant vitamin B-12 deficiency and was told to start OTC tablet but she forgot and is not taking any, level is again low   Lab Results  Component Value Date   WBC 6.9 02/09/2015   HGB 8.7* 02/09/2015   HCT 29.3* 02/09/2015   MCV 64.5* 02/09/2015   PLT 407* 02/09/2015        Lipids: Has mixed dyslipidemia. On Crestor previously but now is on Lipitor 20 mg from her PCP, does have known coronary artery disease      Lab Results  Component Value Date     CHOL 119 01/17/2015   HDL 35.40* 01/17/2015   LDLCALC 59 01/17/2015   TRIG 124.0 01/17/2015   CHOLHDL 3 01/17/2015        She has a long-standing history of Numbness, tingling in her feet treated adequately with gabapentin   Has history of vitamin D deficiency  Physical Examination:  BP 148/78 mmHg  Pulse 87  Temp(Src) 97.8 F (36.6 C)  Resp 16  Ht _0  (1.499 m)  Wt 193 lb (87.544 kg)  BMI 38.96 kg/m2  SpO2 97%      ASSESSMENT:  Diabetes type 2, uncontrolled with marked obesity and BMI  39 See history of present illness for detailed discussion of current management, blood sugar patterns and problems identified  Her A1c is still about the same and not below her goal of 7%   Her weight continues to gradually increase.  She tends to have high postprandial readings despite taking Victoza and mealtime insulin as well as stating that she is eating small portions.  Fasting blood sugars are well controlled with Lantus 30 units which she takes in the morning.  She did not however reduce her dose as recommended  ANEMIA: She has continued anemia and does have B12 deficiency. She has not started B12 as instructed after her last visit   PLAN:   She will need to start checking blood sugars at 2 hours after breakfast and lunch meals  Also Discussed action of SGLT 2 drugs on lowering glucose by decreasing kidney absorption of glucose, benefits of weight loss and lower blood pressure, possible side effects including candidiasis and dosage regimen   She will start Invokana 100 mg daily , may need prior authorization  Victoza 5 clicks beyond 0.6  With starting Invokana she will reduce Lantus by 4 units and mealtime insulin by 2 units , discussed insulin adjustment which may need to be continued further   have a protein at every meal  OTC B12  2000 milligram daily   follow-up in 6 weeks  Patient Instructions  Novolog 16 at dinner, 14 at other meals  When starting Invokana  reduce Lantus to 26 and Novolog by 2 units  Check blood sugars on waking up 3  times a week Also check blood sugars about 2 hours after a meal and do this after different meals by rotation  Recommended blood sugar levels on waking up is 90-130 and about 2 hours after meal is 130-160  Please bring your blood sugar monitor to each visit, thank you  B12 OTC 2000ug daily  Iron every 2  days   Miralax for constipation     Counseling time on subjects discussed above is over 50% of today's 25 minute visit  Kathleen Holt 04/20/2015, 12:43 PM

## 2015-04-20 NOTE — Telephone Encounter (Signed)
-----   Message from Boykin Nearing, MD sent at 04/17/2015  8:28 AM EDT ----- Screening HIV and screening hep C negative

## 2015-04-21 ENCOUNTER — Encounter (HOSPITAL_COMMUNITY): Payer: Medicaid Other

## 2015-04-24 ENCOUNTER — Ambulatory Visit: Payer: Medicaid Other | Admitting: Cardiology

## 2015-04-24 ENCOUNTER — Encounter: Payer: Self-pay | Admitting: Cardiology

## 2015-04-24 ENCOUNTER — Encounter (HOSPITAL_COMMUNITY): Payer: Medicaid Other

## 2015-04-24 NOTE — Progress Notes (Signed)
Cardiology Office Note    Date:  04/25/2015   ID:  Kathleen Holt, DOB [birth year], MRN [id number]  PCP:  Minerva Ends, MD  Cardiologist:  Sueanne Margarita, MD   Chief Complaint  Patient presents with  . Coronary Artery Disease  . Hypertension  . Hyperlipidemia    History of Present Illness:  Kathleen Holt is a 60 y.o. female who presents for followup of CAD. She has a history of ASCAD with  3 drug-eluting stents placed in [place] in 2011. Her stent cards say that one is in the right coronary artery but does not designate the site of the other 2. Her last echocardiogram showed posterior lateral akinesia with mild mitral regurgitation and ejection fraction of 45-50%.  She has had diabetes for 14 years complicated by diabetic neuropathy.  She takes her meds according to her husband but does not follow her diet. She is overweight. She also has hyperlipidemia and history of CVA .  When I last saw her she has been having some chest discomfort with walking that would go away with SL NTG. She denied any dizziness, LE edema or syncope. Nuclear stress test showed anterolateral and inferolateral scar with EF 39%.  Due to complaints of CP, she underwent cath showing 90% distal RCA instent restenosis and chronically occluded mid RCA , prox to mid 100% instent restenosis of the LCX and underwent 3 DES stents to RCA in December 2016.  She was brought back 01/2014 and underwent CTO of the chronically occluded instent restenosis of the LCX with DES.  It was recommended that she be on DAPT for at least a year and possibly long term due to the long area of stent in both arteries.    She presents today doing well.  She denies any chest pain, SOB, DOE, LE edema, dizziness, palpitations or syncope.      Past Medical History  Diagnosis Date  . Coronary artery disease     a. s/p stenting 11/2009 [facility] - PCI of the RCA  and LCX. b. 12/26/2014: Dist RCA 90% stenosed and  100% stenosis of the Prox to Mid-Cx. with 3 DES stents placed to RCA with residual LAD disease of 40%.  4.  Repeat cath 01/2015 with CTO of 100% instent restenosis of the LCX.  Marland Kitchen Hyperlipidemia   . Mitral regurgitation     Mild, echo, January, 2014  . Type II diabetes mellitus ([place])   . Diabetic neuropathy ([place])   . Myocardial infarction ([place]) 2011  . Chronic lower back pain   . Chronic constipation   . Ischemic dilated cardiomyopathy     a. Echo 2/17:  EF 45-50%, inf-lat AK, Gr 1 DD  . Dyslipidemia, goal LDL below 70 02/11/2012    Past Surgical History  Procedure Laterality Date  . Eye surgery    . Vaginal hysterectomy    . Cataract extraction w/ intraocular lens  implant, bilateral    . Coronary angioplasty with stent placement  2011; 12/26/2014  . Cardiac catheterization N/A 12/26/2014    Procedure: Left Heart Cath and Coronary Angiography;  Surgeon: Jettie Booze, MD;  Location: [place] [facility];  Service: Cardiovascular;  Laterality: N/A;  . Cardiac catheterization N/A 12/26/2014    Procedure: Coronary Stent Intervention;  Surgeon: Jettie Booze, MD;  Location: [place] [facility];  Service: Cardiovascular; 3 overlapping Synergy drug-eluting stents, 2.25 x 16, 2.5 x 38 and 3.0 x 32 to the RCA  .  Cardiac catheterization N/A 02/08/2015    Procedure: Coronary/Bypass Graft CTO Intervention;  Surgeon: Jettie Booze, MD;  Location: [place] [facility];  Service: Cardiovascular;  Laterality: N/A;  . Cardiac catheterization  02/08/2015    Procedure: Left Heart Cath and Coronary Angiography;  Surgeon: Jettie Booze, MD;  Location: [place] [facility];  Service: Cardiovascular;;    Current Medications: Outpatient Prescriptions Prior to Visit  Medication Sig Dispense Refill  . ACCU-CHEK SOFTCLIX LANCETS lancets Use as instructed to check blood sugar 2 times per day dx code E11.40 100 each 3  . aspirin 81 MG tablet Take 1 tablet (81 mg total) by mouth daily. 30 tablet 11    . atorvastatin (LIPITOR) 20 MG tablet Take 1 tablet (20 mg total) by mouth daily. 30 tablet 5  . Blood Glucose Monitoring Suppl (ACCU-CHEK AVIVA PLUS) w/Device KIT Use to check blood sugar 2 times per day dx code E11.40 1 kit 0  . canagliflozin (INVOKANA) 100 MG TABS tablet 1 tablet before breakfast 30 tablet 3  . carvedilol (COREG) 3.125 MG tablet Take 1 tablet (3.125 mg total) by mouth 2 (two) times daily with a meal. 60 tablet 5  . clopidogrel (PLAVIX) 75 MG tablet Take 1 tablet (75 mg total) by mouth daily. 90 tablet 3  . docusate sodium (COLACE) 100 MG capsule Take 1 capsule (100 mg total) by mouth daily. (Patient taking differently: Take 100 mg by mouth at bedtime. ) 10 capsule 0  . ferrous sulfate 325 (65 FE) MG tablet Take 1 tablet (325 mg total) by mouth 2 (two) times daily with a meal. 60 tablet 3  . gabapentin (NEURONTIN) 400 MG capsule Take 2 capsules (800 mg total) by mouth 3 (three) times daily. 240 capsule 5  . glucose blood (ACCU-CHEK AVIVA PLUS) test strip Use as instructed to check blood sugar 2 times per day dx code E11.40 100 each 3  . insulin aspart (NOVOLOG FLEXPEN) 100 UNIT/ML FlexPen Inject 12 Units into the skin 3 (three) times daily with meals. 15 pen 2  . Insulin Glargine (LANTUS SOLOSTAR) 100 UNIT/ML Solostar Pen Inject 30 Units into the skin every morning. 15 mL 5  . Insulin Pen Needle 31G X 5 MM MISC Use 5 per day to inject insulin 150 each 2  . Liraglutide (VICTOZA) 18 MG/3ML SOPN Inject 0.2 mLs (1.2 mg total) into the skin at bedtime. 6 mL 5  . lisinopril (PRINIVIL,ZESTRIL) 2.5 MG tablet Take 1 tablet (2.5 mg total) by mouth daily. 30 tablet 5  . metFORMIN (GLUCOPHAGE) 1000 MG tablet Take 1 tablet (1,000 mg total) by mouth 2 (two) times daily with a meal. 60 tablet 5  . nitroGLYCERIN (NITROSTAT) 0.4 MG SL tablet Place 1 tablet (0.4 mg total) under the tongue every 5 (five) minutes x 3 doses as needed for chest pain. 25 tablet 3  . Polyethyl Glycol-Propyl Glycol  0.4-0.3 % SOLN Place 1 drop into both eyes every 3 (three) hours as needed (FOR DRYNESS).     . Vitamin D, Ergocalciferol, (DRISDOL) 50000 units CAPS capsule Take 1 capsule (50,000 Units total) by mouth every 30 (thirty) days. 4 capsule 11   No facility-administered medications prior to visit.     Allergies:   Review of patient's allergies indicates no known allergies.   Social History   Social History  . Marital Status: Married    Spouse Name: N/A  . Number of Children: 3  . Years of Education: N/A   Occupational History  .  Housewife    Social History Main Topics  . Smoking status: Never Smoker   . Smokeless tobacco: Never Used  . Alcohol Use: No  . Drug Use: No  . Sexual Activity: Yes    Birth Control/ Protection: Post-menopausal   Other Topics Concern  . None   Social History Narrative     Family History:  The patient's family history includes CAD in her brother and father; Cancer in her father; Diabetes in her father; Heart attack in her brother and father; Hypertension in her father. There is no history of Stroke.   ROS:   Please see the history of present illness.    ROS All other systems reviewed and are negative.   PHYSICAL EXAM:   VS:  BP 124/76 mmHg  Pulse 79  Ht '4\' 9"'$  (1.448 m)  Wt 189 lb 12.8 oz (86.093 kg)  BMI 41.06 kg/m2   GEN: Well nourished, well developed, in no acute distress HEENT: normal Neck: no JVD, carotid bruits, or masses Cardiac: RRR; no murmurs, rubs, or gallops,no edema.  Intact distal pulses bilaterally.  Respiratory:  clear to auscultation bilaterally, normal work of breathing GI: soft, nontender, nondistended, + BS MS: no deformity or atrophy Skin: warm and dry, no rash Neuro:  Alert and Oriented x 3, Strength and sensation are intact Psych: euthymic mood, full affect  Wt Readings from Last 3 Encounters:  04/25/15 189 lb 12.8 oz (86.093 kg)  04/20/15 193 lb (87.544 kg)  04/13/15 192 lb (87.091 kg)      Studies/Labs  Reviewed:   EKG:  EKG is not ordered today.   Recent Labs: 07/20/2014: ALT 18; TSH 3.02 02/09/2015: Hemoglobin 8.7*; Platelets 407* 04/17/2015: BUN 13; Creatinine, Ser 0.96; Potassium 4.7; Sodium 136   Lipid Panel    Component Value Date/Time   CHOL 119 01/17/2015 0912   TRIG 124.0 01/17/2015 0912   HDL 35.40* 01/17/2015 0912   CHOLHDL 3 01/17/2015 0912   VLDL 24.8 01/17/2015 0912   LDLCALC 59 01/17/2015 0912    Additional studies/ records that were reviewed today include:  Recent cardiac cath    ASSESSMENT:    1. Coronary artery disease due to lipid rich plaque   2. Essential hypertension, benign   3. Ischemic cardiomyopathy   4. S/P coronary artery stent placement   5. Dyslipidemia, goal LDL below 70      PLAN:  In order of problems listed above:  1. CAD s/p PCI of the RCA remotely and recent DES to LCx with instent restenosis.  She has residual 40% LAD disease.  She is doing well after most recent LCx stent with no further angina.  Continue DAPT with ASA/Plavix.  Continue statin/BB.  She needs aggressive treatment of her DM.    2.  HTN - BP well controlled on exam today.  Continue BB/ACE I  3.  Ischemic DCM - EF 39% on nuclear stress test.  EF now 45-50% by recent echo.  Continue BB/ACE I.  4.  Dyslipidemia with LDL goal < 70.  Last LDL 12/2014 at goal at 59.  Continue statin.    Followup with me in 6 months.    Medication Adjustments/Labs and Tests Ordered: Current medicines are reviewed at length with the patient today.  Concerns regarding medicines are outlined above.  Medication changes, Labs and Tests ordered today are listed in the Patient Instructions below. There are no Patient Instructions on file for this visit.   Lurena Nida, MD  04/25/2015 8:24 AM  [facility] [place], [place], [place]  [address] Phone: [phone]; Fax: [phone]

## 2015-04-25 ENCOUNTER — Encounter: Payer: Self-pay | Admitting: Cardiology

## 2015-04-25 ENCOUNTER — Ambulatory Visit (INDEPENDENT_AMBULATORY_CARE_PROVIDER_SITE_OTHER): Payer: Medicaid Other | Admitting: Cardiology

## 2015-04-25 VITALS — BP 124/76 | HR 79 | Ht <= 58 in | Wt 189.8 lb

## 2015-04-25 DIAGNOSIS — Z955 Presence of coronary angioplasty implant and graft: Secondary | ICD-10-CM

## 2015-04-25 DIAGNOSIS — I2583 Coronary atherosclerosis due to lipid rich plaque: Principal | ICD-10-CM

## 2015-04-25 DIAGNOSIS — I10 Essential (primary) hypertension: Secondary | ICD-10-CM

## 2015-04-25 DIAGNOSIS — I255 Ischemic cardiomyopathy: Secondary | ICD-10-CM | POA: Diagnosis not present

## 2015-04-25 DIAGNOSIS — I2510 Atherosclerotic heart disease of native coronary artery without angina pectoris: Secondary | ICD-10-CM | POA: Diagnosis not present

## 2015-04-25 DIAGNOSIS — E785 Hyperlipidemia, unspecified: Secondary | ICD-10-CM

## 2015-04-25 NOTE — Patient Instructions (Signed)
Medication Instructions:  Your physician recommends that you continue on your current medications as directed. Please refer to the Current Medication list given to you today.   Labwork: Your physician recommends that you return for FASTING lab work in June 2017.   Testing/Procedures: None  Follow-Up: Your physician wants you to follow-up in: 6 months with Dr. Radford Pax. You will receive a reminder letter in the mail two months in advance. If you don't receive a letter, please call our office to schedule the follow-up appointment.   Any Other Special Instructions Will Be Listed Below (If Applicable).     If you need a refill on your cardiac medications before your next appointment, please call your pharmacy.

## 2015-04-26 ENCOUNTER — Encounter (HOSPITAL_COMMUNITY): Payer: Medicaid Other

## 2015-04-28 ENCOUNTER — Encounter (HOSPITAL_COMMUNITY): Payer: Medicaid Other

## 2015-05-01 ENCOUNTER — Encounter (HOSPITAL_COMMUNITY): Payer: Medicaid Other

## 2015-05-03 ENCOUNTER — Encounter (HOSPITAL_COMMUNITY): Payer: Medicaid Other

## 2015-05-05 ENCOUNTER — Encounter (HOSPITAL_COMMUNITY): Payer: Medicaid Other

## 2015-05-08 ENCOUNTER — Encounter (HOSPITAL_COMMUNITY): Payer: Medicaid Other

## 2015-05-10 ENCOUNTER — Encounter (HOSPITAL_COMMUNITY): Payer: Medicaid Other

## 2015-05-12 ENCOUNTER — Encounter (HOSPITAL_COMMUNITY): Payer: Medicaid Other

## 2015-05-15 ENCOUNTER — Encounter (HOSPITAL_COMMUNITY): Payer: Medicaid Other

## 2015-05-17 ENCOUNTER — Encounter (HOSPITAL_COMMUNITY): Payer: Medicaid Other

## 2015-05-19 ENCOUNTER — Encounter (HOSPITAL_COMMUNITY): Payer: Medicaid Other

## 2015-05-22 ENCOUNTER — Encounter (HOSPITAL_COMMUNITY): Payer: Medicaid Other

## 2015-05-24 ENCOUNTER — Encounter (HOSPITAL_COMMUNITY): Payer: Medicaid Other

## 2015-05-26 ENCOUNTER — Encounter (HOSPITAL_COMMUNITY): Payer: Medicaid Other

## 2015-05-29 ENCOUNTER — Other Ambulatory Visit: Payer: Self-pay | Admitting: *Deleted

## 2015-05-29 ENCOUNTER — Encounter (HOSPITAL_COMMUNITY): Payer: Medicaid Other

## 2015-05-29 DIAGNOSIS — E1149 Type 2 diabetes mellitus with other diabetic neurological complication: Secondary | ICD-10-CM

## 2015-05-29 MED ORDER — GLUCOSE BLOOD VI STRP
ORAL_STRIP | Status: DC
Start: 2015-05-29 — End: 2015-09-28

## 2015-05-31 ENCOUNTER — Other Ambulatory Visit (INDEPENDENT_AMBULATORY_CARE_PROVIDER_SITE_OTHER): Payer: Medicaid Other

## 2015-05-31 ENCOUNTER — Encounter (HOSPITAL_COMMUNITY): Payer: Medicaid Other

## 2015-05-31 DIAGNOSIS — E1165 Type 2 diabetes mellitus with hyperglycemia: Secondary | ICD-10-CM | POA: Diagnosis not present

## 2015-05-31 DIAGNOSIS — D649 Anemia, unspecified: Secondary | ICD-10-CM | POA: Diagnosis not present

## 2015-05-31 DIAGNOSIS — Z794 Long term (current) use of insulin: Secondary | ICD-10-CM | POA: Diagnosis not present

## 2015-05-31 LAB — BASIC METABOLIC PANEL
BUN: 15 mg/dL (ref 6–23)
CO2: 25 mEq/L (ref 19–32)
CREATININE: 0.94 mg/dL (ref 0.40–1.20)
Calcium: 9.1 mg/dL (ref 8.4–10.5)
Chloride: 104 mEq/L (ref 96–112)
GFR: 64.11 mL/min (ref >60.00)
Glucose, Bld: 110 mg/dL — ABNORMAL HIGH (ref 70–99)
Potassium: 4.8 mEq/L (ref 3.5–5.1)
Sodium: 138 mEq/L (ref 135–145)

## 2015-05-31 LAB — CBC
HCT: 35.8 % — ABNORMAL LOW (ref 36.0–46.0)
Hemoglobin: 11.2 g/dL — ABNORMAL LOW (ref 12.0–15.0)
MCHC: 31.4 g/dL (ref 30.0–36.0)
Platelets: 333 10*3/uL (ref 150.0–400.0)
RBC: 5.16 Mil/uL — AB (ref 3.87–5.11)
RDW: 19.7 % — AB (ref 11.5–15.5)
WBC: 8.5 10*3/uL (ref 4.0–10.5)

## 2015-06-01 LAB — FRUCTOSAMINE: FRUCTOSAMINE: 241 umol/L (ref 0–285)

## 2015-06-02 ENCOUNTER — Encounter (HOSPITAL_COMMUNITY): Payer: Medicaid Other

## 2015-06-05 ENCOUNTER — Encounter (HOSPITAL_COMMUNITY): Payer: Medicaid Other

## 2015-06-07 ENCOUNTER — Encounter (HOSPITAL_COMMUNITY): Payer: Medicaid Other

## 2015-06-07 ENCOUNTER — Ambulatory Visit (INDEPENDENT_AMBULATORY_CARE_PROVIDER_SITE_OTHER): Payer: Medicaid Other | Admitting: Endocrinology

## 2015-06-07 ENCOUNTER — Encounter: Payer: Self-pay | Admitting: Endocrinology

## 2015-06-07 VITALS — BP 114/70 | HR 99 | Temp 97.9°F | Resp 16 | Ht <= 58 in | Wt 189.4 lb

## 2015-06-07 DIAGNOSIS — E669 Obesity, unspecified: Secondary | ICD-10-CM

## 2015-06-07 DIAGNOSIS — Z794 Long term (current) use of insulin: Secondary | ICD-10-CM

## 2015-06-07 DIAGNOSIS — E1165 Type 2 diabetes mellitus with hyperglycemia: Secondary | ICD-10-CM | POA: Diagnosis not present

## 2015-06-07 DIAGNOSIS — E785 Hyperlipidemia, unspecified: Secondary | ICD-10-CM | POA: Diagnosis not present

## 2015-06-07 DIAGNOSIS — D510 Vitamin B12 deficiency anemia due to intrinsic factor deficiency: Secondary | ICD-10-CM | POA: Diagnosis not present

## 2015-06-07 MED ORDER — PREGABALIN 100 MG PO CAPS: 100.0000 mg | ORAL_CAPSULE | Freq: Two times a day (BID) | ORAL | Status: DC

## 2015-06-07 NOTE — Patient Instructions (Signed)
Novolog 16-17 at dinner, more with eating more rice or potato; add nuts at dinner  Check blood sugars on waking up   times a week Also check blood sugars about 2 hours after a meal and do this after different meals by rotation  Recommended blood sugar levels on waking up is 90-130 and about 2 hours after meal is 130-160  Please bring your blood sugar monitor to each visit, thank you  Lantus 28 units

## 2015-06-07 NOTE — Progress Notes (Signed)
Patient ID: Kathleen Holt, female   DOB: Jan 04, 1956, 60 y.o.   MRN: [id number]    Reason for Appointment: Follow-up of type 2 Diabetes  History of Present Illness:          Diagnosis: Type 2 diabetes mellitus, date of diagnosis: 2005        Past history: The diabetes was diagnosed about 10 years ago and she was probably treated with metformin and other oral agents for at least 6 years. Previously she had tried metformin, Januvia and Amaryl When she was admitted for coronary disease in 2011 in [place] she was switched to insulin and oral agents. Not clear how her control has been in the past She has been on Lantus and NovoLog insulin since 2011 She also had tried Byetta with her insulin for sometime but not clear if it was helping her  Because of her poor control and persistently high A1c of 10.1% in 04/2013 her metformin was increased to maximum dose. Also she was started on Victoza but she was unable to tolerate the 1.2 mg dosage since she had decreased appetite and abdominal distress  Recent history:   INSULIN regimen is described as:  Lantus 30 at am, NovoLog 14 units ac tid  Her A1c continues to be over 7%   Currently on basal bolus insulin regimen with Lantus insulin in the mornings She was also started on Invokana on her last visit on 04/20/15 Not clear if this is benefiting her control but her fructosamine is fairly good at 240 now  Current blood sugar patterns and problems identified:  She is checking blood sugars either in the morning or late eveningafter supper  She has fairlyconsistently high POSTPRANDIAL readings after supper although sometimes at bedtime her blood sugars comeback down  She was told to adjust her mealtime doses based on her meal size but she is taking the same dose with every meal  She does have relatively higher reading sometimes with more carbohydrate.  Still despite discussion she does not identify protein to her meals except having some  lentils.  She does not like dairy products or other high protein foods, is vegetarian   FASTING readings are mostly near normal, averaging just over 100.  No hypoglycemia reported overnight.  However she did not reduce her Lantus as directed  A1c is still relatively higher at 7.7  Still not able to exercise much      She did not tolerate 1.2 mg Victoza previously, taking 0.9 mg   Oral hypoglycemic drugs the patient is taking are: Metformin 2 g daily      Side effects from medications have been: abdominal distress and anorexia with 1.2 Victoza  Glucose monitoring:  done once or twice daily        Glucometer: Accu-Chek  Blood Glucose readings from Download show  Mean values apply above for all meters except median for One Touch  PRE-MEAL Fasting Lunch Dinner Bedtime Overall  Glucose range: 80-137      Mean/median: 112   147 142   POST-MEAL PC Breakfast PC Lunch PC Dinner  Glucose range:   162-300  Mean/median:   199     Glycemic control  Lab Results  Component Value Date   HGBA1C 7.6* 04/17/2015   HGBA1C 7.3 04/13/2015   HGBA1C 7.4* 01/17/2015   Lab Results  Component Value Date   MICROALBUR 2.7* 04/13/2014   LDLCALC 59 01/17/2015   CREATININE 0.94 05/31/2015    Self-care: The diet that  the patient has been following is: None, usually vegetarian, Small portions       Exercise: a little walking around her apartment or going up steps         Dietician visit:  none              Compliance with the medical regimen: Fair  Weight history:  Wt Readings from Last 3 Encounters:  06/07/15 189 lb 6.4 oz (85.911 kg)  04/25/15 189 lb 12.8 oz (86.093 kg)  04/20/15 193 lb (87.544 kg)     No visits with results within 1 Week(s) from this visit. Latest known visit with results is:  Lab on 05/31/2015  Component Date Value Ref Range Status  . Sodium 05/31/2015 138  135 - 145 mEq/L Final  . Potassium 05/31/2015 4.8  3.5 - 5.1 mEq/L Final  . Chloride 05/31/2015 104  96 - 112  mEq/L Final  . CO2 05/31/2015 25  19 - 32 mEq/L Final  . Glucose, Bld 05/31/2015 110* 70 - 99 mg/dL Final  . BUN 05/31/2015 15  6 - 23 mg/dL Final  . Creatinine, Ser 05/31/2015 0.94  0.40 - 1.20 mg/dL Final  . Calcium 05/31/2015 9.1  8.4 - 10.5 mg/dL Final  . GFR 05/31/2015 64.11  >60.00 mL/min Final  . Fructosamine 05/31/2015 241  0 - 285 umol/L Final   Comment: Published reference interval for apparently healthy subjects between age 67 and 35 is 76 - 285 umol/L and in a poorly controlled diabetic population is 228 - 563 umol/L with a mean of 396 umol/L.   Marland Kitchen WBC 05/31/2015 8.5  4.0 - 10.5 K/uL Final  . RBC 05/31/2015 5.16* 3.87 - 5.11 Mil/uL Final  . Platelets 05/31/2015 333.0  150.0 - 400.0 K/uL Final  . Hemoglobin 05/31/2015 11.2* 12.0 - 15.0 g/dL Final  . HCT 05/31/2015 35.8* 36.0 - 46.0 % Final  . MCV 05/31/2015 69.4 Repeated and verified X2.* 78.0 - 100.0 fl Final  . MCHC 05/31/2015 31.4  30.0 - 36.0 g/dL Final  . RDW 05/31/2015 19.7* 11.5 - 15.5 % Final      Medication List       This list is accurate as of: 06/07/15  2:44 PM.  Always use your most recent med list.               ACCU-CHEK AVIVA PLUS w/Device Kit  Use to check blood sugar 2 times per day dx code E11.40     ACCU-CHEK SOFTCLIX LANCETS lancets  Use as instructed to check blood sugar 2 times per day dx code E11.40     aspirin 81 MG tablet  Take 1 tablet (81 mg total) by mouth daily.     atorvastatin 20 MG tablet  Commonly known as:  LIPITOR  Take 1 tablet (20 mg total) by mouth daily.     canagliflozin 100 MG Tabs tablet  Commonly known as:  INVOKANA  1 tablet before breakfast     carvedilol 3.125 MG tablet  Commonly known as:  COREG  Take 1 tablet (3.125 mg total) by mouth 2 (two) times daily with a meal.     clopidogrel 75 MG tablet  Commonly known as:  PLAVIX  Take 1 tablet (75 mg total) by mouth daily.     docusate sodium 100 MG capsule  Commonly known as:  COLACE  Take 1 capsule (100  mg total) by mouth daily.     ferrous sulfate 325 (65 FE) MG tablet  Take 1  tablet (325 mg total) by mouth 2 (two) times daily with a meal.     gabapentin 400 MG capsule  Commonly known as:  NEURONTIN  Take 2 capsules (800 mg total) by mouth 3 (three) times daily.     glucose blood test strip  Commonly known as:  ACCU-CHEK AVIVA PLUS  Use as instructed to check blood sugar 2 times per day dx code E11.40     insulin aspart 100 UNIT/ML FlexPen  Commonly known as:  NOVOLOG FLEXPEN  Inject 12 Units into the skin 3 (three) times daily with meals.     Insulin Glargine 100 UNIT/ML Solostar Pen  Commonly known as:  LANTUS SOLOSTAR  Inject 30 Units into the skin every morning.     Insulin Pen Needle 31G X 5 MM Misc  Use 5 per day to inject insulin     Liraglutide 18 MG/3ML Sopn  Commonly known as:  VICTOZA  Inject 0.2 mLs (1.2 mg total) into the skin at bedtime.     lisinopril 2.5 MG tablet  Commonly known as:  PRINIVIL,ZESTRIL  Take 1 tablet (2.5 mg total) by mouth daily.     metFORMIN 1000 MG tablet  Commonly known as:  GLUCOPHAGE  Take 1 tablet (1,000 mg total) by mouth 2 (two) times daily with a meal.     nitroGLYCERIN 0.4 MG SL tablet  Commonly known as:  NITROSTAT  Place 1 tablet (0.4 mg total) under the tongue every 5 (five) minutes x 3 doses as needed for chest pain.     Polyethyl Glycol-Propyl Glycol 0.4-0.3 % Soln  Place 1 drop into both eyes every 3 (three) hours as needed (FOR DRYNESS).     pregabalin 100 MG capsule  Commonly known as:  LYRICA  Take 1 capsule (100 mg total) by mouth 2 (two) times daily.     Vitamin D (Ergocalciferol) 50000 units Caps capsule  Commonly known as:  DRISDOL  Take 1 capsule (50,000 Units total) by mouth every 30 (thirty) days.        Allergies: No Known Allergies  Past Medical History  Diagnosis Date  . Coronary artery disease     a. s/p stenting 11/2009 [facility] - PCI of the RCA  and LCX. b.  12/26/2014: Dist RCA 90% stenosed and 100% stenosis of the Prox to Mid-Cx. with 3 DES stents placed to RCA with residual LAD disease of 40%.  4.  Repeat cath 01/2015 with CTO of 100% instent restenosis of the LCX.  Marland Kitchen Hyperlipidemia   . Mitral regurgitation     Mild, echo, January, 2014  . Type II diabetes mellitus ([place])   . Diabetic neuropathy ([place])   . Myocardial infarction ([place]) 2011  . Chronic lower back pain   . Chronic constipation   . Ischemic dilated cardiomyopathy     a. Echo 2/17:  EF 45-50%, inf-lat AK, Gr 1 DD  . Dyslipidemia, goal LDL below 70 02/11/2012    Past Surgical History  Procedure Laterality Date  . Eye surgery    . Vaginal hysterectomy    . Cataract extraction w/ intraocular lens  implant, bilateral    . Coronary angioplasty with stent placement  2011; 12/26/2014  . Cardiac catheterization N/A 12/26/2014    Procedure: Left Heart Cath and Coronary Angiography;  Surgeon: Jettie Booze, MD;  Location: [place] [facility];  Service: Cardiovascular;  Laterality: N/A;  . Cardiac catheterization N/A 12/26/2014    Procedure: Coronary Stent Intervention;  Surgeon: Jettie Booze, MD;  Location: [place] [facility];  Service: Cardiovascular; 3 overlapping Synergy drug-eluting stents, 2.25 x 16, 2.5 x 38 and 3.0 x 32 to the RCA  . Cardiac catheterization N/A 02/08/2015    Procedure: Coronary/Bypass Graft CTO Intervention;  Surgeon: Jettie Booze, MD;  Location: [place] [facility];  Service: Cardiovascular;  Laterality: N/A;  . Cardiac catheterization  02/08/2015    Procedure: Left Heart Cath and Coronary Angiography;  Surgeon: Jettie Booze, MD;  Location: [place] [facility];  Service: Cardiovascular;;    Family History  Problem Relation Age of Onset  . CAD Father     MI at age 85  . Diabetes Father   . Hypertension Father   . Cancer Father   . CAD Brother   . Heart attack Father   . Heart attack Brother   . Stroke Neg Hx     Social History:  reports  that she has never smoked. She has never used smokeless tobacco. She reports that she does not drink alcohol or use illicit drugs.    Review of Systems   Eye exam: Last in  3/16  She was having problems with shortness of breath on exertion and has been significantly anemic The cause of iron deficiency is unknown She is taking iron once a day but is complaining about constipation again  She also appears to have significant vitamin B-12 deficiency and was told to start OTC tablet, now taking 2000 units daily Her hemoglobin is significantly better now  Her dyspnea on exertion, fatigue is better   Lab Results  Component Value Date   WBC 8.5 05/31/2015   HGB 11.2* 05/31/2015   HCT 35.8* 05/31/2015   MCV 69.4 Repeated and verified X2.* 05/31/2015   PLT 333.0 05/31/2015        Lipids: Has mixed dyslipidemia. On Crestor previously but now is on Lipitor 20 mg from her PCP, does have known coronary artery disease      Lab Results  Component Value Date   CHOL 119 01/17/2015   HDL 35.40* 01/17/2015   LDLCALC 59 01/17/2015   TRIG 124.0 01/17/2015   CHOLHDL 3 01/17/2015        She has a long-standing history of Numbness, tingling in her feet treated with gabapentin  However she still is having some tingling in her fingers and feet and is not able to sleep well consistently She thinks she may have done better with Lyrica which was not covered by insurance previously  Has history of vitamin D deficiency  Physical Examination:  BP 114/70 mmHg  Pulse 99  Temp(Src) 97.9 F (36.6 C)  Resp 16  Ht 4' 9"  (1.448 m)  Wt 189 lb 6.4 oz (85.911 kg)  BMI 40.97 kg/m2  SpO2 95%      ASSESSMENT:  Diabetes type 2, uncontrolled with marked obesity and BMI  39 See history of present illness for detailed discussion of current management, blood sugar patterns and problems identified  Her A1c is still about the same and not below her goal of 7% Although she has seen some weight loss instead  of weight gain with starting Invokana not clear if her postprandial blood sugars are any better. Her fructosamine is improved and A1c is still not better Fasting blood sugars are excellent Not able to exercise much  NEUROPATHY: She is having persistent symptoms  ANEMIA: She has anemia and does have B12 deficiency. She has started B12 as instructed after  her last visit with improved hemoglobin   PLAN:   She will need to add more protein to her meals  She will try to increase her Novolog to at least 16 units at suppertime  Also will try to be more consistent with taking this before eating when eating out.  Need to take 18 units if eating more carbohydrate  Reduce Lantus to 28  Continue Invokana and Victoza  Increase exercise as tolerated  Trial of Lyrica 100 mg twice a day instead of gabapentin  May try taking iron every other day to relieve constipation  Needs follow-up B12 on the next visit  Patient Instructions  Novolog 16-17 at dinner, more with eating more rice or potato; add nuts at dinner  Check blood sugars on waking up   times a week Also check blood sugars about 2 hours after a meal and do this after different meals by rotation  Recommended blood sugar levels on waking up is 90-130 and about 2 hours after meal is 130-160  Please bring your blood sugar monitor to each visit, thank you  Lantus 28 units    Counseling time on subjects discussed above is over 50% of today's 25 minute visit  Khristie Sak 06/07/2015, 2:44 PM

## 2015-06-09 ENCOUNTER — Encounter (HOSPITAL_COMMUNITY): Payer: Medicaid Other

## 2015-06-12 ENCOUNTER — Encounter (HOSPITAL_COMMUNITY): Payer: Medicaid Other

## 2015-06-14 ENCOUNTER — Other Ambulatory Visit: Payer: Self-pay | Admitting: *Deleted

## 2015-06-14 ENCOUNTER — Encounter (HOSPITAL_COMMUNITY): Payer: Medicaid Other

## 2015-06-14 DIAGNOSIS — E1149 Type 2 diabetes mellitus with other diabetic neurological complication: Secondary | ICD-10-CM

## 2015-06-14 MED ORDER — METFORMIN HCL 1000 MG PO TABS: 1000.0000 mg | ORAL_TABLET | Freq: Two times a day (BID) | ORAL | Status: DC

## 2015-06-16 ENCOUNTER — Encounter (HOSPITAL_COMMUNITY): Payer: Medicaid Other

## 2015-06-22 ENCOUNTER — Other Ambulatory Visit (INDEPENDENT_AMBULATORY_CARE_PROVIDER_SITE_OTHER): Payer: Medicaid Other | Admitting: *Deleted

## 2015-06-22 ENCOUNTER — Ambulatory Visit: Payer: Medicaid Other | Attending: Family Medicine | Admitting: Physician Assistant

## 2015-06-22 ENCOUNTER — Encounter: Payer: Self-pay | Admitting: Physician Assistant

## 2015-06-22 VITALS — BP 109/74 | HR 86 | Temp 98.0°F | Wt 188.8 lb

## 2015-06-22 DIAGNOSIS — J069 Acute upper respiratory infection, unspecified: Secondary | ICD-10-CM

## 2015-06-22 DIAGNOSIS — E785 Hyperlipidemia, unspecified: Secondary | ICD-10-CM

## 2015-06-22 LAB — LIPID PANEL
CHOL/HDL RATIO: 3.1 ratio (ref <=5.0)
Cholesterol: 132 mg/dL (ref 125–200)
HDL: 42 mg/dL — ABNORMAL LOW (ref >=46)
LDL CALC: 58 mg/dL (ref <130)
Triglycerides: 161 mg/dL — ABNORMAL HIGH (ref <150)
VLDL: 32 mg/dL — ABNORMAL HIGH (ref <30)

## 2015-06-22 LAB — HEPATIC FUNCTION PANEL
ALBUMIN: 4.3 g/dL (ref 3.6–5.1)
ALK PHOS: 77 U/L (ref 33–130)
ALT: 19 U/L (ref 6–29)
AST: 16 U/L (ref 10–35)
BILIRUBIN INDIRECT: 0.2 mg/dL (ref 0.2–1.2)
Bilirubin, Direct: 0.1 mg/dL (ref <=0.2)
TOTAL PROTEIN: 7.2 g/dL (ref 6.1–8.1)
Total Bilirubin: 0.3 mg/dL (ref 0.2–1.2)

## 2015-06-22 MED ORDER — PHENYLEPHRINE-CHLORPHEN-DM 3.5-1-3 MG/ML PO LIQD: 1.0000 mL | Freq: Four times a day (QID) | ORAL | Status: DC | PRN

## 2015-06-22 MED ORDER — BENZONATATE 100 MG PO CAPS: 200.0000 mg | ORAL_CAPSULE | Freq: Three times a day (TID) | ORAL | Status: DC | PRN

## 2015-06-22 MED ORDER — FLUTICASONE PROPIONATE 50 MCG/ACT NA SUSP: 2.0000 | Freq: Every day | NASAL | Status: DC

## 2015-06-22 NOTE — Progress Notes (Signed)
Patient ID: Kathleen Holt, female   DOB: 04-Oct-1955, 60 y.o.   MRN: [id number]   [name], is a 60 y.o. female  TDD:[id number]  WCB:[id number]  DOB - 1955-12-31  Chief Complaint  Patient presents with  . URI    cough-dry;runny nose;wheezing ;chest congestion x 2 dys        Subjective:  Chief Complaint and HPI: Kathleen Holt is a 60 y.o. female here today for sneezing, runny nose, nasal congestion, and cough X 2 days.  No f/c. She hasn't taken any OTC meds.  [employer] used-"Ronnie."  She checked her blood sugar this morning and it was 109.  She is compliant with her medications.  Last in office glucose on 05/31/2015 was 110.  She denies s/sx of hypoglycemia.      ROS:   Constitutional:  No f/c, No night sweats, No unexplained weight loss. EENT:  No vision changes, No blurry vision, No hearing changes. +slight ST, runny nose Respiratory: +dry cough, No SOB Cardiac: No CP, no palpitations GI:  No abd pain, No N/V/D. GU: No Urinary s/sx Musculoskeletal: No joint pain Neuro: No headache, no dizziness, no motor weakness.  Skin: No rash Endocrine:  No polydipsia. No polyuria.  Psych: Denies SI/HI  No problems updated.  ALLERGIES: No Known Allergies  PAST MEDICAL HISTORY: Past Medical History  Diagnosis Date  . Coronary artery disease     a. s/p stenting 11/2009 [facility] - PCI of the RCA  and LCX. b. 12/26/2014: Dist RCA 90% stenosed and 100% stenosis of the Prox to Mid-Cx. with 3 DES stents placed to RCA with residual LAD disease of 40%.  4.  Repeat cath 01/2015 with CTO of 100% instent restenosis of the LCX.  Marland Kitchen Hyperlipidemia   . Mitral regurgitation     Mild, echo, January, 2014  . Type II diabetes mellitus ([place])   . Diabetic neuropathy ([place])   . Myocardial infarction ([place]) 2011  . Chronic lower back pain   . Chronic constipation   . Ischemic dilated cardiomyopathy     a. Echo 2/17:  EF 45-50%, inf-lat AK, Gr 1 DD  .  Dyslipidemia, goal LDL below 70 02/11/2012    MEDICATIONS AT HOME: Prior to Admission medications   Medication Sig Start Date End Date Taking? Authorizing Provider  ACCU-CHEK SOFTCLIX LANCETS lancets Use as instructed to check blood sugar 2 times per day dx code E11.40 10/12/14  Yes Boykin Nearing, MD  aspirin 81 MG tablet Take 1 tablet (81 mg total) by mouth daily. 01/18/15  Yes Isaiah Serge, NP  atorvastatin (LIPITOR) 20 MG tablet Take 1 tablet (20 mg total) by mouth daily. 04/13/15  Yes Josalyn Funches, MD  Blood Glucose Monitoring Suppl (ACCU-CHEK AVIVA PLUS) w/Device KIT Use to check blood sugar 2 times per day dx code E11.40 04/20/15  Yes Elayne Snare, MD  canagliflozin [facility]) 100 MG TABS tablet 1 tablet before breakfast 04/20/15  Yes Elayne Snare, MD  carvedilol (COREG) 3.125 MG tablet Take 1 tablet (3.125 mg total) by mouth 2 (two) times daily with a meal. 04/13/15  Yes Josalyn Funches, MD  clopidogrel (PLAVIX) 75 MG tablet Take 1 tablet (75 mg total) by mouth daily. 10/11/14  Yes Josalyn Funches, MD  ferrous sulfate 325 (65 FE) MG tablet Take 1 tablet (325 mg total) by mouth 2 (two) times daily with a meal. 01/27/15  Yes Elayne Snare, MD  glucose blood (ACCU-CHEK AVIVA PLUS) test strip Use as instructed to check  blood sugar 2 times per day dx code E11.40 05/29/15  Yes Elayne Snare, MD  insulin aspart (NOVOLOG FLEXPEN) 100 UNIT/ML FlexPen Inject 12 Units into the skin 3 (three) times daily with meals. Patient taking differently: Inject 14 Units into the skin 3 (three) times daily with meals.  04/13/15  Yes Josalyn Funches, MD  Insulin Glargine (LANTUS SOLOSTAR) 100 UNIT/ML Solostar Pen Inject 30 Units into the skin every morning. 04/13/15  Yes Josalyn Funches, MD  Insulin Pen Needle 31G X 5 MM MISC Use 5 per day to inject insulin 04/13/14  Yes Elayne Snare, MD  Liraglutide (VICTOZA) 18 MG/3ML SOPN Inject 0.2 mLs (1.2 mg total) into the skin at bedtime. 04/13/15  Yes Josalyn Funches, MD  lisinopril  (PRINIVIL,ZESTRIL) 2.5 MG tablet Take 1 tablet (2.5 mg total) by mouth daily. 04/13/15  Yes Boykin Nearing, MD  metFORMIN (GLUCOPHAGE) 1000 MG tablet Take 1 tablet (1,000 mg total) by mouth 2 (two) times daily with a meal. 06/14/15  Yes Elayne Snare, MD  nitroGLYCERIN (NITROSTAT) 0.4 MG SL tablet Place 1 tablet (0.4 mg total) under the tongue every 5 (five) minutes x 3 doses as needed for chest pain. 12/01/14  Yes Sueanne Margarita, MD  Polyethyl Glycol-Propyl Glycol 0.4-0.3 % SOLN Place 1 drop into both eyes every 3 (three) hours as needed (FOR DRYNESS).    Yes Historical Provider, MD  pregabalin (LYRICA) 100 MG capsule Take 1 capsule (100 mg total) by mouth 2 (two) times daily. 06/07/15  Yes Elayne Snare, MD  Vitamin D, Ergocalciferol, (DRISDOL) 50000 units CAPS capsule Take 1 capsule (50,000 Units total) by mouth every 30 (thirty) days. 03/22/15  Yes Josalyn Funches, MD  benzonatate (TESSALON) 100 MG capsule Take 2 capsules (200 mg total) by mouth 3 (three) times daily as needed for cough. 06/22/15   Argentina Donovan, PA-C  chlorpheniramine-phenylephrine-dextromethorphan (CARDEC DM) 3.5-1-3 MG/ML solution Take 1 mL by mouth every 6 (six) hours as needed for cough. 06/22/15   Argentina Donovan, PA-C  docusate sodium (COLACE) 100 MG capsule Take 1 capsule (100 mg total) by mouth daily. Patient not taking: Reported on 06/22/2015 12/27/14   Erma Heritage, PA  fluticasone [facility]) 50 MCG/ACT nasal spray Place 2 sprays into both nostrils daily. 06/22/15   Argentina Donovan, PA-C  gabapentin (NEURONTIN) 400 MG capsule Take 2 capsules (800 mg total) by mouth 3 (three) times daily. Patient not taking: Reported on 06/22/2015 04/13/15   Boykin Nearing, MD     Objective:  EXAM:   Filed Vitals:   06/22/15 1139  BP: 109/74  Pulse: 86  Temp: 98 F (36.7 C)  TempSrc: Oral  Weight: 188 lb 12.8 oz (85.639 kg)  SpO2: 98%    General appearance : A&OX3. NAD. Non-toxic-appearing HEENT: Atraumatic and Normocephalic.   PERRLA. EOM intact.  TM clear B. Mouth-MMM, post pharynx w/ mild erythema, no exudate, No PND.  Nose with clear rhinorrhea, turbinates inflamed.  Neck: supple, no JVD. No cervical lymphadenopathy. No thyromegaly Chest/Lungs:  Breathing-non-labored, Good air entry bilaterally, breath sounds normal without rales, rhonchi, or wheezing  CVS: S1 S2 regular, no murmurs, gallops, rubs  Abdomen: Bowel sounds present, Non tender and not distended with no gaurding, rigidity or rebound. Extremities: Bilateral Lower Ext shows no edema, both legs are warm to touch with = pulse throughout Neurology:  CN II-XII grossly intact, Non focal.   Psych:  TP linear. J/I WNL. Normal speech. Appropriate eye contact and affect.  Skin:  No Rash  Data Review Lab Results  Component Value Date   HGBA1C 7.6* 04/17/2015   HGBA1C 7.3 04/13/2015   HGBA1C 7.4* 01/17/2015     Assessment & Plan   1. Acute upper respiratory infection - fluticasone (FLONASE) 50 MCG/ACT nasal spray; Place 2 sprays into both nostrils daily.  Dispense: 16 g; Refill: 6 - chlorpheniramine-phenylephrine-dextromethorphan (CARDEC DM) 3.5-1-3 MG/ML solution; Take 1 mL by mouth every 6 (six) hours as needed for cough.  Dispense: 120 mL; Refill: 0 - benzonatate (TESSALON) 100 MG capsule; Take 2 capsules (200 mg total) by mouth 3 (three) times daily as needed for cough.  Dispense: 30 capsule; Refill: 0 Fluids, rest, resp care.   2.  Diabetes-blood sugar 109 this morning fasting.  Continue current regimen  Patient have been counseled extensively about nutrition and exercise  Return in about 4 weeks (around 07/20/2015) for f/up with Dr. Adrian Blackwater.(due for regular f/up then)  The patient was given clear instructions to go to ER or return to medical center if symptoms don't improve, worsen or new problems develop. The patient verbalized understanding. The patient was told to call to get lab results if they haven't heard anything in the next week.      Freeman Caldron, PA-C [facility] and [place], [place]   06/22/2015, 1:17 PM

## 2015-06-23 ENCOUNTER — Other Ambulatory Visit: Payer: Self-pay | Admitting: Pharmacist

## 2015-06-23 MED ORDER — GUAIFENESIN-DM 100-10 MG/5ML PO SYRP: 5.0000 mL | ORAL_SOLUTION | ORAL | Status: DC | PRN

## 2015-07-13 ENCOUNTER — Ambulatory Visit: Payer: Medicaid Other | Admitting: Family Medicine

## 2015-08-02 ENCOUNTER — Other Ambulatory Visit (INDEPENDENT_AMBULATORY_CARE_PROVIDER_SITE_OTHER): Payer: Medicaid Other

## 2015-08-02 DIAGNOSIS — E669 Obesity, unspecified: Secondary | ICD-10-CM | POA: Diagnosis not present

## 2015-08-02 DIAGNOSIS — D510 Vitamin B12 deficiency anemia due to intrinsic factor deficiency: Secondary | ICD-10-CM

## 2015-08-02 DIAGNOSIS — E1165 Type 2 diabetes mellitus with hyperglycemia: Secondary | ICD-10-CM | POA: Diagnosis not present

## 2015-08-02 DIAGNOSIS — E785 Hyperlipidemia, unspecified: Secondary | ICD-10-CM | POA: Diagnosis not present

## 2015-08-02 DIAGNOSIS — Z794 Long term (current) use of insulin: Secondary | ICD-10-CM

## 2015-08-02 LAB — COMPREHENSIVE METABOLIC PANEL
ALT: 17 U/L (ref 0–35)
AST: 14 U/L (ref 0–37)
Albumin: 3.9 g/dL (ref 3.5–5.2)
Alkaline Phosphatase: 75 U/L (ref 39–117)
BILIRUBIN TOTAL: 0.3 mg/dL (ref 0.2–1.2)
BUN: 18 mg/dL (ref 6–23)
CALCIUM: 9.1 mg/dL (ref 8.4–10.5)
CHLORIDE: 104 meq/L (ref 96–112)
CO2: 26 meq/L (ref 19–32)
Creatinine, Ser: 1.02 mg/dL (ref 0.40–1.20)
GFR: 58.31 mL/min — AB (ref >60.00)
Glucose, Bld: 110 mg/dL — ABNORMAL HIGH (ref 70–99)
Potassium: 5.1 mEq/L (ref 3.5–5.1)
Sodium: 137 mEq/L (ref 135–145)
Total Protein: 7.2 g/dL (ref 6.0–8.3)

## 2015-08-02 LAB — CBC
HCT: 36.5 % (ref 36.0–46.0)
Hemoglobin: 11.6 g/dL — ABNORMAL LOW (ref 12.0–15.0)
MCHC: 31.8 g/dL (ref 30.0–36.0)
MCV: 70.8 fl — AB (ref 78.0–100.0)
PLATELETS: 375 10*3/uL (ref 150.0–400.0)
RBC: 5.16 Mil/uL — AB (ref 3.87–5.11)
RDW: 18.7 % — ABNORMAL HIGH (ref 11.5–15.5)
WBC: 7.5 10*3/uL (ref 4.0–10.5)

## 2015-08-02 LAB — TSH: TSH: 2.77 u[IU]/mL (ref 0.35–4.50)

## 2015-08-02 LAB — VITAMIN B12: Vitamin B-12: 644 pg/mL (ref 211–911)

## 2015-08-02 LAB — HEMOGLOBIN A1C: Hgb A1c MFr Bld: 7.5 % — ABNORMAL HIGH (ref 4.6–6.5)

## 2015-08-07 ENCOUNTER — Encounter: Payer: Self-pay | Admitting: Endocrinology

## 2015-08-07 ENCOUNTER — Ambulatory Visit (INDEPENDENT_AMBULATORY_CARE_PROVIDER_SITE_OTHER): Payer: Medicaid Other | Admitting: Endocrinology

## 2015-08-07 VITALS — BP 122/70 | HR 84 | Ht <= 58 in | Wt 192.0 lb

## 2015-08-07 DIAGNOSIS — E1149 Type 2 diabetes mellitus with other diabetic neurological complication: Secondary | ICD-10-CM

## 2015-08-07 DIAGNOSIS — E1140 Type 2 diabetes mellitus with diabetic neuropathy, unspecified: Secondary | ICD-10-CM | POA: Diagnosis not present

## 2015-08-07 DIAGNOSIS — E669 Obesity, unspecified: Secondary | ICD-10-CM | POA: Diagnosis not present

## 2015-08-07 DIAGNOSIS — D510 Vitamin B12 deficiency anemia due to intrinsic factor deficiency: Secondary | ICD-10-CM

## 2015-08-07 MED ORDER — DULOXETINE HCL 30 MG PO CPEP: 30.0000 mg | ORAL_CAPSULE | Freq: Every day | ORAL | Status: DC

## 2015-08-07 MED ORDER — GABAPENTIN 400 MG PO CAPS: 800.0000 mg | ORAL_CAPSULE | Freq: Three times a day (TID) | ORAL | Status: DC

## 2015-08-07 NOTE — Progress Notes (Signed)
Patient ID: Kathleen Holt, female   DOB: 02-11-1955, 60 y.o.   MRN: [id number]    Reason for Appointment: Follow-up of type 2 Diabetes  History of Present Illness:          Diagnosis: Type 2 diabetes mellitus, date of diagnosis: 2005        Past history: The diabetes was diagnosed about 10 years ago and she was probably treated with metformin and other oral agents for at least 6 years. Previously she had tried metformin, Januvia and Amaryl When she was admitted for coronary disease in 2011 in [place] she was switched to insulin and oral agents. Not clear how her control has been in the past She has been on Lantus and NovoLog insulin since 2011 She also had tried Byetta with her insulin for sometime but not clear if it was helping her  Because of her poor control and persistently high A1c of 10.1% in 04/2013 her metformin was increased to maximum dose. Also she was started on Victoza but she was unable to tolerate the 1.2 mg dosage since she had decreased appetite and abdominal distress  Recent history:   INSULIN regimen is described as:  Lantus 30 at am, NovoLog 14 units ac breakfast and lunch and 16 at supper   Her A1c continues to be over 7%   Currently on basal bolus insulin regimen with Lantus insulin in the mornings She was also started on Invokana on her last visit on 04/20/15  Current blood sugar patterns and problems identified:  She is checking blood sugars either in the morning or late eveningafter supper but did not bring her monitor for download today  She thinks her blood sugars after evening meal are better with increasing her Novolog by 2 units  Most likely requires larger doses of mealtime insulin because of her relatively high carbohydrate diet even though she is taking Victoza and reportedly having small portions  Although she was supposed to cut her Lantus to 28 she has not done so  She has no reported hypoglycemic symptoms at any time   FASTING  readings are mostly near normal  Still not able to exercise much      She did not tolerate 1.2 mg Victoza previously, taking 0.9 mg   Oral hypoglycemic drugs the patient is taking are: Metformin 2 g daily, Invokana 100 mg daily      Side effects from medications have been: abdominal distress and anorexia with 1.2 Victoza  Glucose monitoring:  done once or twice daily        Glucometer: Accu-Chek  Blood Glucose readings from recall show    PRE-MEAL Fasting Lunch Dinner Bedtime Overall  Glucose range: 85   150   Mean/median:         Glycemic control  Lab Results  Component Value Date   HGBA1C 7.5* 08/02/2015   HGBA1C 7.6* 04/17/2015   HGBA1C 7.3 04/13/2015   Lab Results  Component Value Date   MICROALBUR 2.7* 04/13/2014   LDLCALC 58 06/22/2015   CREATININE 1.02 08/02/2015    Self-care: The diet that the patient has been following is: None, usually vegetarian, Small portions       Exercise: a little walking around her apartment or going up steps         Dietician visit:  none              Compliance with the medical regimen: Fair  Weight history:  Wt Readings from Last 3  Encounters:  08/07/15 192 lb (87.091 kg)  06/22/15 188 lb 12.8 oz (85.639 kg)  06/07/15 189 lb 6.4 oz (85.911 kg)     Lab on 08/02/2015  Component Date Value Ref Range Status  . Hgb A1c MFr Bld 08/02/2015 7.5* 4.6 - 6.5 % Final   Glycemic Control Guidelines for People with Diabetes:Non Diabetic:  <6%Goal of Therapy: <7%Additional Action Suggested:  >8%   . Sodium 08/02/2015 137  135 - 145 mEq/L Final  . Potassium 08/02/2015 5.1  3.5 - 5.1 mEq/L Final  . Chloride 08/02/2015 104  96 - 112 mEq/L Final  . CO2 08/02/2015 26  19 - 32 mEq/L Final  . Glucose, Bld 08/02/2015 110* 70 - 99 mg/dL Final  . BUN 08/02/2015 18  6 - 23 mg/dL Final  . Creatinine, Ser 08/02/2015 1.02  0.40 - 1.20 mg/dL Final  . Total Bilirubin 08/02/2015 0.3  0.2 - 1.2 mg/dL Final  . Alkaline Phosphatase 08/02/2015 75  39 - 117  U/L Final  . AST 08/02/2015 14  0 - 37 U/L Final  . ALT 08/02/2015 17  0 - 35 U/L Final  . Total Protein 08/02/2015 7.2  6.0 - 8.3 g/dL Final  . Albumin 08/02/2015 3.9  3.5 - 5.2 g/dL Final  . Calcium 08/02/2015 9.1  8.4 - 10.5 mg/dL Final  . GFR 08/02/2015 58.31* >60.00 mL/min Final  . WBC 08/02/2015 7.5  4.0 - 10.5 K/uL Final  . RBC 08/02/2015 5.16* 3.87 - 5.11 Mil/uL Final  . Platelets 08/02/2015 375.0  150.0 - 400.0 K/uL Final  . Hemoglobin 08/02/2015 11.6* 12.0 - 15.0 g/dL Final  . HCT 08/02/2015 36.5  36.0 - 46.0 % Final  . MCV 08/02/2015 70.8* 78.0 - 100.0 fl Final  . MCHC 08/02/2015 31.8  30.0 - 36.0 g/dL Final  . RDW 08/02/2015 18.7* 11.5 - 15.5 % Final  . Vitamin B-12 08/02/2015 644  211 - 911 pg/mL Final  . TSH 08/02/2015 2.77  0.35 - 4.50 uIU/mL Final      Medication List       This list is accurate as of: 08/07/15 10:25 AM.  Always use your most recent med list.               ACCU-CHEK AVIVA PLUS w/Device Kit  Use to check blood sugar 2 times per day dx code E11.40     ACCU-CHEK SOFTCLIX LANCETS lancets  Use as instructed to check blood sugar 2 times per day dx code E11.40     aspirin 81 MG tablet  Take 1 tablet (81 mg total) by mouth daily.     atorvastatin 20 MG tablet  Commonly known as:  LIPITOR  Take 1 tablet (20 mg total) by mouth daily.     canagliflozin 100 MG Tabs tablet  Commonly known as:  INVOKANA  1 tablet before breakfast     carvedilol 3.125 MG tablet  Commonly known as:  COREG  Take 1 tablet (3.125 mg total) by mouth 2 (two) times daily with a meal.     clopidogrel 75 MG tablet  Commonly known as:  PLAVIX  Take 1 tablet (75 mg total) by mouth daily.     docusate sodium 100 MG capsule  Commonly known as:  COLACE  Take 1 capsule (100 mg total) by mouth daily.     DULoxetine 30 MG capsule  Commonly known as:  CYMBALTA  Take 1 capsule (30 mg total) by mouth daily. With food  ferrous sulfate 325 (65 FE) MG tablet  Take 1 tablet  (325 mg total) by mouth 2 (two) times daily with a meal.     fluticasone 50 MCG/ACT nasal spray  Commonly known as:  FLONASE  Place 2 sprays into both nostrils daily.     gabapentin 400 MG capsule  Commonly known as:  NEURONTIN  Take 2 capsules (800 mg total) by mouth 3 (three) times daily.     glucose blood test strip  Commonly known as:  ACCU-CHEK AVIVA PLUS  Use as instructed to check blood sugar 2 times per day dx code E11.40     guaiFENesin-dextromethorphan 100-10 MG/5ML syrup  Commonly known as:  ROBITUSSIN DM  Take 5 mLs by mouth every 4 (four) hours as needed for cough.     insulin aspart 100 UNIT/ML FlexPen  Commonly known as:  NOVOLOG FLEXPEN  Inject 12 Units into the skin 3 (three) times daily with meals.     Insulin Glargine 100 UNIT/ML Solostar Pen  Commonly known as:  LANTUS SOLOSTAR  Inject 30 Units into the skin every morning.     Insulin Pen Needle 31G X 5 MM Misc  Use 5 per day to inject insulin     Liraglutide 18 MG/3ML Sopn  Commonly known as:  VICTOZA  Inject 0.2 mLs (1.2 mg total) into the skin at bedtime.     lisinopril 2.5 MG tablet  Commonly known as:  PRINIVIL,ZESTRIL  Take 1 tablet (2.5 mg total) by mouth daily.     metFORMIN 1000 MG tablet  Commonly known as:  GLUCOPHAGE  Take 1 tablet (1,000 mg total) by mouth 2 (two) times daily with a meal.     nitroGLYCERIN 0.4 MG SL tablet  Commonly known as:  NITROSTAT  Place 1 tablet (0.4 mg total) under the tongue every 5 (five) minutes x 3 doses as needed for chest pain.     Polyethyl Glycol-Propyl Glycol 0.4-0.3 % Soln  Place 1 drop into both eyes every 3 (three) hours as needed (FOR DRYNESS).     Vitamin D (Ergocalciferol) 50000 units Caps capsule  Commonly known as:  DRISDOL  Take 1 capsule (50,000 Units total) by mouth every 30 (thirty) days.        Allergies: No Known Allergies  Past Medical History  Diagnosis Date  . Coronary artery disease     a. s/p stenting 11/2009 [facility] - PCI of the RCA  and LCX. b. 12/26/2014: Dist RCA 90% stenosed and 100% stenosis of the Prox to Mid-Cx. with 3 DES stents placed to RCA with residual LAD disease of 40%.  4.  Repeat cath 01/2015 with CTO of 100% instent restenosis of the LCX.  Marland Kitchen Hyperlipidemia   . Mitral regurgitation     Mild, echo, January, 2014  . Type II diabetes mellitus ([place])   . Diabetic neuropathy ([place])   . Myocardial infarction ([place]) 2011  . Chronic lower back pain   . Chronic constipation   . Ischemic dilated cardiomyopathy     a. Echo 2/17:  EF 45-50%, inf-lat AK, Gr 1 DD  . Dyslipidemia, goal LDL below 70 02/11/2012    Past Surgical History  Procedure Laterality Date  . Eye surgery    . Vaginal hysterectomy    . Cataract extraction w/ intraocular lens  implant, bilateral    . Coronary angioplasty with stent placement  2011; 12/26/2014  . Cardiac catheterization N/A 12/26/2014    Procedure: Left Heart  Cath and Coronary Angiography;  Surgeon: Jettie Booze, MD;  Location: [place] [facility];  Service: Cardiovascular;  Laterality: N/A;  . Cardiac catheterization N/A 12/26/2014    Procedure: Coronary Stent Intervention;  Surgeon: Jettie Booze, MD;  Location: [place] [facility];  Service: Cardiovascular; 3 overlapping Synergy drug-eluting stents, 2.25 x 16, 2.5 x 38 and 3.0 x 32 to the RCA  . Cardiac catheterization N/A 02/08/2015    Procedure: Coronary/Bypass Graft CTO Intervention;  Surgeon: Jettie Booze, MD;  Location: [place] [facility];  Service: Cardiovascular;  Laterality: N/A;  . Cardiac catheterization  02/08/2015    Procedure: Left Heart Cath and Coronary Angiography;  Surgeon: Jettie Booze, MD;  Location: [place] [facility];  Service: Cardiovascular;;    Family History  Problem Relation Age of Onset  . CAD Father     MI at age 20  . Diabetes Father   . Hypertension Father   . Cancer Father   . CAD Brother   . Heart attack Father   . Heart  attack Brother   . Stroke Neg Hx     Social History:  reports that she has never smoked. She has never used smokeless tobacco. She reports that she does not drink alcohol or use illicit drugs.    Review of Systems   Eye exam: Last in  3/16  Her anemia is improved, this is related to B12 and iron deficiency  The cause of iron deficiency is unknown, although has been deferred to PCP  She is taking iron once a day  She also appears to have significant vitamin B-12 deficiency and  She was told to start OTC tablet, now taking 1000 g although complaining about difficulty swallowing this Her hemoglobin is significantly improved and B12 is back to normal   Lab Results  Component Value Date   WBC 7.5 08/02/2015   HGB 11.6* 08/02/2015   HCT 36.5 08/02/2015   MCV 70.8* 08/02/2015   PLT 375.0 08/02/2015        Lipids: Has mixed dyslipidemia. On Crestor previously but now is on Lipitor 20 mg from her PCP, does have known coronary artery disease      Lab Results  Component Value Date   CHOL 132 06/22/2015   HDL 42* 06/22/2015   LDLCALC 58 06/22/2015   TRIG 161* 06/22/2015   CHOLHDL 3.1 06/22/2015        She has a long-standing history of Numbness, tingling, Burning in her feet treated with Lyrica, previously on gabapentin  She does not think Lyrica works as well His still is having some tingling in her fingers and feet and is not able to sleep well   Has history of vitamin D deficiency  Physical Examination:  BP 122/70 mmHg  Pulse 84  Ht 4' 9"  (1.448 m)  Wt 192 lb (87.091 kg)  BMI 41.54 kg/m2  SpO2 94%      ASSESSMENT:  Diabetes type 2, uncontrolled with marked obesity and BMI  39 See history of present illness for detailed discussion of current management, blood sugar patterns and problems identified  Her A1c is still about the same and not below her goal of 7% She reports relatively good postprandial readings but not checking after lunch or breakfast Fasting  blood sugars are excellent Not able to exercise much and weight is going up  NEUROPATHY: She is having persistent symptoms and prefers to go back to gabapentin instead of Lyrica Also has not tried  Cymbalta in the past and discussed how this works  ANEMIA: She has anemia a improved with both B12 and iron supplements Hemoglobin nearly normal   PLAN:   No change in insulin yet  Try to check blood sugars at various times of the day and less in the morning  She will need to bring her monitor for download on the next visit  Discuss possibly cutting back on Lantus if morning readings are consistently below 80-90  Add protein to every meal consistently  Encouraged her to walk more for weight loss  Cymbalta 30 mg daily with food and if improving symptoms will increase to 60 mg by mouth at also may start cutting back on gabapentin when symptoms are relieved  Patient Instructions  Check blood sugars on waking up 3-4  times a week Also check blood sugars about 2 hours after a meal and do this after different meals by rotation  Recommended blood sugar levels on waking up is 90-130 and about 2 hours after meal is 130-160  Please bring your blood sugar monitor to each visit, thank you  May reduce gabapentin when Cymbalta helps pains      Counseling time on subjects discussed above is over 50% of today's 25 minute visit  Fender Herder 08/07/2015, 10:25 AM

## 2015-08-07 NOTE — Patient Instructions (Signed)
Check blood sugars on waking up 3-4  times a week Also check blood sugars about 2 hours after a meal and do this after different meals by rotation  Recommended blood sugar levels on waking up is 90-130 and about 2 hours after meal is 130-160  Please bring your blood sugar monitor to each visit, thank you  May reduce gabapentin when Cymbalta helps pains

## 2015-08-13 ENCOUNTER — Other Ambulatory Visit: Payer: Self-pay | Admitting: Endocrinology

## 2015-09-04 ENCOUNTER — Telehealth: Payer: Self-pay | Admitting: Family Medicine

## 2015-09-04 DIAGNOSIS — E1149 Type 2 diabetes mellitus with other diabetic neurological complication: Secondary | ICD-10-CM

## 2015-09-04 DIAGNOSIS — I25119 Atherosclerotic heart disease of native coronary artery with unspecified angina pectoris: Secondary | ICD-10-CM

## 2015-09-04 DIAGNOSIS — E785 Hyperlipidemia, unspecified: Secondary | ICD-10-CM

## 2015-09-04 DIAGNOSIS — I10 Essential (primary) hypertension: Secondary | ICD-10-CM

## 2015-09-04 MED ORDER — CLOPIDOGREL BISULFATE 75 MG PO TABS: 75.0000 mg | ORAL_TABLET | Freq: Every day | ORAL | 3 refills | Status: DC

## 2015-09-04 MED ORDER — DULOXETINE HCL 30 MG PO CPEP: 30.0000 mg | ORAL_CAPSULE | Freq: Every day | ORAL | 2 refills | Status: DC

## 2015-09-04 MED ORDER — ATORVASTATIN CALCIUM 20 MG PO TABS: 20.0000 mg | ORAL_TABLET | Freq: Every day | ORAL | 2 refills | Status: DC

## 2015-09-04 MED ORDER — LIRAGLUTIDE 18 MG/3ML ~~LOC~~ SOPN: 1.2000 mg | PEN_INJECTOR | Freq: Every day | SUBCUTANEOUS | 2 refills | Status: DC

## 2015-09-04 MED ORDER — METFORMIN HCL 1000 MG PO TABS: 1000.0000 mg | ORAL_TABLET | Freq: Two times a day (BID) | ORAL | 2 refills | Status: DC

## 2015-09-04 MED ORDER — INSULIN GLARGINE 100 UNIT/ML SOLOSTAR PEN: 30.0000 [IU] | PEN_INJECTOR | Freq: Every morning | SUBCUTANEOUS | 5 refills | Status: DC

## 2015-09-04 MED ORDER — LISINOPRIL 2.5 MG PO TABS: 2.5000 mg | ORAL_TABLET | Freq: Every day | ORAL | 2 refills | Status: DC

## 2015-09-04 MED ORDER — CARVEDILOL 3.125 MG PO TABS: 3.1250 mg | ORAL_TABLET | Freq: Two times a day (BID) | ORAL | 2 refills | Status: DC

## 2015-09-04 MED ORDER — INSULIN ASPART 100 UNIT/ML FLEXPEN: 12.0000 [IU] | PEN_INJECTOR | Freq: Three times a day (TID) | SUBCUTANEOUS | 2 refills | Status: DC

## 2015-09-04 MED ORDER — CANAGLIFLOZIN 100 MG PO TABS: 100.0000 mg | ORAL_TABLET | Freq: Every day | ORAL | 2 refills | Status: DC

## 2015-09-04 NOTE — Telephone Encounter (Signed)
Chronic medications refilled - patient should contact pharmacy for refills in the future

## 2015-09-04 NOTE — Telephone Encounter (Signed)
Pt. Called requesting a refill on all her current medications. Please f/u.

## 2015-09-13 ENCOUNTER — Other Ambulatory Visit: Payer: Self-pay | Admitting: Pharmacist

## 2015-09-13 MED ORDER — INSULIN PEN NEEDLE 31G X 5 MM MISC: 5 refills | Status: AC

## 2015-09-28 ENCOUNTER — Encounter: Payer: Self-pay | Admitting: Family Medicine

## 2015-09-28 ENCOUNTER — Ambulatory Visit: Payer: Medicaid Other | Attending: Family Medicine | Admitting: Family Medicine

## 2015-09-28 VITALS — BP 146/77 | HR 86 | Temp 97.5°F | Ht 60.0 in | Wt 190.8 lb

## 2015-09-28 DIAGNOSIS — B352 Tinea manuum: Secondary | ICD-10-CM | POA: Diagnosis not present

## 2015-09-28 DIAGNOSIS — Z23 Encounter for immunization: Secondary | ICD-10-CM | POA: Diagnosis not present

## 2015-09-28 DIAGNOSIS — Z7984 Long term (current) use of oral hypoglycemic drugs: Secondary | ICD-10-CM | POA: Diagnosis not present

## 2015-09-28 DIAGNOSIS — Z79899 Other long term (current) drug therapy: Secondary | ICD-10-CM | POA: Insufficient documentation

## 2015-09-28 DIAGNOSIS — E1149 Type 2 diabetes mellitus with other diabetic neurological complication: Secondary | ICD-10-CM

## 2015-09-28 DIAGNOSIS — Z794 Long term (current) use of insulin: Secondary | ICD-10-CM | POA: Diagnosis not present

## 2015-09-28 DIAGNOSIS — E785 Hyperlipidemia, unspecified: Secondary | ICD-10-CM

## 2015-09-28 DIAGNOSIS — I10 Essential (primary) hypertension: Secondary | ICD-10-CM | POA: Diagnosis not present

## 2015-09-28 DIAGNOSIS — I25119 Atherosclerotic heart disease of native coronary artery with unspecified angina pectoris: Secondary | ICD-10-CM

## 2015-09-28 DIAGNOSIS — Z7982 Long term (current) use of aspirin: Secondary | ICD-10-CM | POA: Diagnosis not present

## 2015-09-28 LAB — GLUCOSE, POCT (MANUAL RESULT ENTRY): POC Glucose: 179 mg/dl — AB (ref 70–99)

## 2015-09-28 MED ORDER — CARVEDILOL 3.125 MG PO TABS: 3.1250 mg | ORAL_TABLET | Freq: Two times a day (BID) | ORAL | 1 refills | Status: DC

## 2015-09-28 MED ORDER — CLOTRIMAZOLE 1 % EX CREA: 1.0000 "application " | TOPICAL_CREAM | Freq: Two times a day (BID) | CUTANEOUS | 1 refills | Status: DC

## 2015-09-28 MED ORDER — GLUCOSE BLOOD VI STRP: ORAL_STRIP | 3 refills | Status: AC

## 2015-09-28 MED ORDER — CLOPIDOGREL BISULFATE 75 MG PO TABS: 75.0000 mg | ORAL_TABLET | Freq: Every day | ORAL | 3 refills | Status: DC

## 2015-09-28 MED ORDER — LIRAGLUTIDE 18 MG/3ML ~~LOC~~ SOPN: 1.2000 mg | PEN_INJECTOR | Freq: Every day | SUBCUTANEOUS | 1 refills | Status: DC

## 2015-09-28 MED ORDER — METFORMIN HCL 1000 MG PO TABS: 1000.0000 mg | ORAL_TABLET | Freq: Two times a day (BID) | ORAL | 1 refills | Status: DC

## 2015-09-28 MED ORDER — INSULIN GLARGINE 100 UNIT/ML SOLOSTAR PEN: 30.0000 [IU] | PEN_INJECTOR | Freq: Every morning | SUBCUTANEOUS | 1 refills | Status: DC

## 2015-09-28 MED ORDER — CANAGLIFLOZIN 100 MG PO TABS: 100.0000 mg | ORAL_TABLET | Freq: Every day | ORAL | 1 refills | Status: DC

## 2015-09-28 MED ORDER — ATORVASTATIN CALCIUM 20 MG PO TABS: 20.0000 mg | ORAL_TABLET | Freq: Every day | ORAL | 1 refills | Status: DC

## 2015-09-28 MED ORDER — DULOXETINE HCL 30 MG PO CPEP: 30.0000 mg | ORAL_CAPSULE | Freq: Every day | ORAL | 1 refills | Status: DC

## 2015-09-28 MED ORDER — INSULIN ASPART 100 UNIT/ML FLEXPEN: 12.0000 [IU] | PEN_INJECTOR | Freq: Three times a day (TID) | SUBCUTANEOUS | 2 refills | Status: DC

## 2015-09-28 MED ORDER — ACCU-CHEK SOFTCLIX LANCETS MISC: 3 refills | Status: AC

## 2015-09-28 MED ORDER — GABAPENTIN 400 MG PO CAPS: 800.0000 mg | ORAL_CAPSULE | Freq: Three times a day (TID) | ORAL | 1 refills | Status: DC

## 2015-09-28 MED ORDER — LISINOPRIL 2.5 MG PO TABS: 2.5000 mg | ORAL_TABLET | Freq: Every day | ORAL | 1 refills | Status: DC

## 2015-09-28 NOTE — Patient Instructions (Signed)
Diabetes Mellitus and Food It is important for you to manage your blood sugar (glucose) level. Your blood glucose level can be greatly affected by what you eat. Eating healthier foods in the appropriate amounts throughout the day at about the same time each day will help you control your blood glucose level. It can also help slow or prevent worsening of your diabetes mellitus. Healthy eating may even help you improve the level of your blood pressure and reach or maintain a healthy weight.  General recommendations for healthful eating and cooking habits include:  Eating meals and snacks regularly. Avoid going long periods of time without eating to lose weight.  Eating a diet that consists mainly of plant-based foods, such as fruits, vegetables, nuts, legumes, and whole grains.  Using low-heat cooking methods, such as baking, instead of high-heat cooking methods, such as deep frying. Work with your dietitian to make sure you understand how to use the Nutrition Facts information on food labels. HOW CAN FOOD AFFECT ME? Carbohydrates Carbohydrates affect your blood glucose level more than any other type of food. Your dietitian will help you determine how many carbohydrates to eat at each meal and teach you how to count carbohydrates. Counting carbohydrates is important to keep your blood glucose at a healthy level, especially if you are using insulin or taking certain medicines for diabetes mellitus. Alcohol Alcohol can cause sudden decreases in blood glucose (hypoglycemia), especially if you use insulin or take certain medicines for diabetes mellitus. Hypoglycemia can be a life-threatening condition. Symptoms of hypoglycemia (sleepiness, dizziness, and disorientation) are similar to symptoms of having too much alcohol.  If your health care provider has given you approval to drink alcohol, do so in moderation and use the following guidelines:  Women should not have more than one drink per day, and men  should not have more than two drinks per day. One drink is equal to:  12 oz of beer.  5 oz of wine.  1 oz of hard liquor.  Do not drink on an empty stomach.  Keep yourself hydrated. Have water, diet soda, or unsweetened iced tea.  Regular soda, juice, and other mixers might contain a lot of carbohydrates and should be counted. WHAT FOODS ARE NOT RECOMMENDED? As you make food choices, it is important to remember that all foods are not the same. Some foods have fewer nutrients per serving than other foods, even though they might have the same number of calories or carbohydrates. It is difficult to get your body what it needs when you eat foods with fewer nutrients. Examples of foods that you should avoid that are high in calories and carbohydrates but low in nutrients include:  Trans fats (most processed foods list trans fats on the Nutrition Facts label).  Regular soda.  Juice.  Candy.  Sweets, such as cake, pie, doughnuts, and cookies.  Fried foods. WHAT FOODS CAN I EAT? Eat nutrient-rich foods, which will nourish your body and keep you healthy. The food you should eat also will depend on several factors, including:  The calories you need.  The medicines you take.  Your weight.  Your blood glucose level.  Your blood pressure level.  Your cholesterol level. You should eat a variety of foods, including:  Protein.  Lean cuts of meat.  Proteins low in saturated fats, such as fish, egg whites, and beans. Avoid processed meats.  Fruits and vegetables.  Fruits and vegetables that may help control blood glucose levels, such as apples, mangoes, and   yams.  Dairy products.  Choose fat-free or low-fat dairy products, such as milk, yogurt, and cheese.  Grains, bread, pasta, and rice.  Choose whole grain products, such as multigrain bread, whole oats, and brown rice. These foods may help control blood pressure.  Fats.  Foods containing healthful fats, such as nuts,  avocado, olive oil, canola oil, and fish. DOES EVERYONE WITH DIABETES MELLITUS HAVE THE SAME MEAL PLAN? Because every person with diabetes mellitus is different, there is not one meal plan that works for everyone. It is very important that you meet with a dietitian who will help you create a meal plan that is just right for you.   This information is not intended to replace advice given to you by your health care provider. Make sure you discuss any questions you have with your health care provider.   Document Released: 10/04/2004 Document Revised: 01/28/2014 Document Reviewed: 12/04/2012 Elsevier Interactive Patient Education 2016 [employer]  

## 2015-09-28 NOTE — Progress Notes (Signed)
Subjective:  Patient ID: Kathleen Holt, female    DOB: Jul 23, 1955  Age: 60 y.o. MRN: [id number]  CC: Diabetes and Hypertension   HPI [name] is a 59 year old female with a history of type 2 diabetes mellitus (A1c 7.5), diabetic neuropathy hypertension, coronary artery disease(Status post PCI with DES 3, PCI of LCx) who comes in to the clinic for a follow up visit.  Her Diabetes Mellitus is managed by [facility] and she has been compliant with her medications on a diabetic diet. Request a new meter and test strips.  She complains of numbness in her hands and her feet but has been compliant with her gabapentin; review of her chart indicates she was placed on Cymbalta as well to help with this but she has not been taking this. Complains of splitting of the tips of her fingers along with pruritus of her palm.  Her cardiologist is Dr.Turner.  Past Medical History:  Diagnosis Date  . Chronic constipation   . Chronic lower back pain   . Coronary artery disease    a. s/p stenting 11/2009 [facility] - PCI of the RCA  and LCX. b. 12/26/2014: Dist RCA 90% stenosed and 100% stenosis of the Prox to Mid-Cx. with 3 DES stents placed to RCA with residual LAD disease of 40%.  4.  Repeat cath 01/2015 with CTO of 100% instent restenosis of the LCX.  . Diabetic neuropathy ([place])   . Dyslipidemia, goal LDL below 70 02/11/2012  . Hyperlipidemia   . Ischemic dilated cardiomyopathy    a. Echo 2/17:  EF 45-50%, inf-lat AK, Gr 1 DD  . Mitral regurgitation    Mild, echo, January, 2014  . Myocardial infarction ([place]) 2011  . Type II diabetes mellitus ([place])     Past Surgical History:  Procedure Laterality Date  . CARDIAC CATHETERIZATION N/A 12/26/2014   Procedure: Left Heart Cath and Coronary Angiography;  Surgeon: Jettie Booze, MD;  Location: [place] [facility];  Service: Cardiovascular;  Laterality: N/A;  . CARDIAC CATHETERIZATION N/A 12/26/2014   Procedure: Coronary Stent Intervention;  Surgeon: Jettie Booze, MD;  Location: [place] [facility];  Service: Cardiovascular; 3 overlapping Synergy drug-eluting stents, 2.25 x 16, 2.5 x 38 and 3.0 x 32 to the RCA  . CARDIAC CATHETERIZATION N/A 02/08/2015   Procedure: Coronary/Bypass Graft CTO Intervention;  Surgeon: Jettie Booze, MD;  Location: [place] [facility];  Service: Cardiovascular;  Laterality: N/A;  . CARDIAC CATHETERIZATION  02/08/2015   Procedure: Left Heart Cath and Coronary Angiography;  Surgeon: Jettie Booze, MD;  Location: [place] [facility];  Service: Cardiovascular;;  . CATARACT EXTRACTION W/ INTRAOCULAR LENS  IMPLANT, BILATERAL    . CORONARY ANGIOPLASTY WITH STENT PLACEMENT  2011; 12/26/2014  . EYE SURGERY    . VAGINAL HYSTERECTOMY       Outpatient Medications Prior to Visit  Medication Sig Dispense Refill  . aspirin 81 MG tablet Take 1 tablet (81 mg total) by mouth daily. 30 tablet 11  . Blood Glucose Monitoring Suppl (ACCU-CHEK AVIVA PLUS) w/Device KIT Use to check blood sugar 2 times per day dx code E11.40 1 kit 0  . docusate sodium (COLACE) 100 MG capsule Take 1 capsule (100 mg total) by mouth daily. 10 capsule 0  . ferrous sulfate 325 (65 FE) MG tablet Take 1 tablet (325 mg total) by mouth 2 (two) times daily with a meal. 60 tablet 3  . guaiFENesin-dextromethorphan (ROBITUSSIN DM) 100-10  MG/5ML syrup Take 5 mLs by mouth every 4 (four) hours as needed for cough. 118 mL 0  . Insulin Pen Needle 31G X 5 MM MISC Use as directed for insulin injection. 200 each 5  . Polyethyl Glycol-Propyl Glycol 0.4-0.3 % SOLN Place 1 drop into both eyes every 3 (three) hours as needed (FOR DRYNESS).     . Vitamin D, Ergocalciferol, (DRISDOL) 50000 units CAPS capsule Take 1 capsule (50,000 Units total) by mouth every 30 (thirty) days. 4 capsule 11  . ACCU-CHEK SOFTCLIX LANCETS lancets Use as instructed to check blood sugar 2 times per day dx code E11.40 100 each 3  .  atorvastatin (LIPITOR) 20 MG tablet Take 1 tablet (20 mg total) by mouth daily. 30 tablet 2  . canagliflozin (INVOKANA) 100 MG TABS tablet Take 1 tablet (100 mg total) by mouth daily before breakfast. 30 tablet 2  . carvedilol (COREG) 3.125 MG tablet Take 1 tablet (3.125 mg total) by mouth 2 (two) times daily with a meal. 60 tablet 2  . clopidogrel (PLAVIX) 75 MG tablet Take 1 tablet (75 mg total) by mouth daily. 90 tablet 3  . gabapentin (NEURONTIN) 400 MG capsule Take 2 capsules (800 mg total) by mouth 3 (three) times daily. 240 capsule 5  . glucose blood (ACCU-CHEK AVIVA PLUS) test strip Use as instructed to check blood sugar 2 times per day dx code E11.40 100 each 3  . insulin aspart (NOVOLOG FLEXPEN) 100 UNIT/ML FlexPen Inject 12 Units into the skin 3 (three) times daily with meals. 15 pen 2  . Insulin Glargine (LANTUS SOLOSTAR) 100 UNIT/ML Solostar Pen Inject 30 Units into the skin every morning. 15 mL 5  . Liraglutide (VICTOZA) 18 MG/3ML SOPN Inject 0.2 mLs (1.2 mg total) into the skin at bedtime. 6 mL 2  . lisinopril (PRINIVIL,ZESTRIL) 2.5 MG tablet Take 1 tablet (2.5 mg total) by mouth daily. 30 tablet 2  . metFORMIN (GLUCOPHAGE) 1000 MG tablet Take 1 tablet (1,000 mg total) by mouth 2 (two) times daily with a meal. 60 tablet 2  . nitroGLYCERIN (NITROSTAT) 0.4 MG SL tablet Place 1 tablet (0.4 mg total) under the tongue every 5 (five) minutes x 3 doses as needed for chest pain. (Patient not taking: Reported on 09/28/2015) 25 tablet 3  . DULoxetine (CYMBALTA) 30 MG capsule Take 1 capsule (30 mg total) by mouth daily. With food (Patient not taking: Reported on 09/28/2015) 30 capsule 2  . fluticasone (FLONASE) 50 MCG/ACT nasal spray Place 2 sprays into both nostrils daily. (Patient not taking: Reported on 09/28/2015) 16 g 6   No facility-administered medications prior to visit.     ROS Review of Systems  Constitutional: Negative for activity change, appetite change and fatigue.  HENT: Negative  for congestion, sinus pressure and sore throat.   Eyes: Negative for visual disturbance.  Respiratory: Negative for cough, chest tightness, shortness of breath and wheezing.   Cardiovascular: Negative for chest pain and palpitations.  Gastrointestinal: Negative for abdominal distention, abdominal pain and constipation.  Endocrine: Negative for polydipsia.  Genitourinary: Negative for dysuria and frequency.  Musculoskeletal: Negative for arthralgias and back pain.  Skin:       See history of present illness  Neurological: Negative for tremors, light-headedness and numbness.  Hematological: Does not bruise/bleed easily.  Psychiatric/Behavioral: Negative for agitation and behavioral problems.    Objective:  BP (!) 146/77 (BP Location: Right Arm, Patient Position: Sitting, Cuff Size: Small)   Pulse 86   Temp 97.5 F (  36.4 C) (Oral)   Ht 5' (1.524 m)   Wt 190 lb 12.8 oz (86.5 kg)   SpO2 98%   BMI 37.26 kg/m   BP/Weight 09/28/2015 01/22/7508 02/25/8525  Systolic BP 782 423 536  Diastolic BP 77 70 74  Wt. (Lbs) 190.8 192 188.8  BMI 37.26 41.54 40.84      Physical Exam  Constitutional: She is oriented to person, place, and time. She appears well-developed and well-nourished.  Cardiovascular: Normal rate, normal heart sounds and intact distal pulses.   No murmur heard. Pulmonary/Chest: Effort normal and breath sounds normal. She has no wheezes. She has no rales. She exhibits no tenderness.  Abdominal: Soft. Bowel sounds are normal. She exhibits no distension and no mass. There is no tenderness.  Musculoskeletal: Normal range of motion.  Neurological: She is alert and oriented to person, place, and time.  Skin:  Dry skin with cracking of the tips of the fingers     Lab Results  Component Value Date   HGBA1C 7.5 (H) 08/02/2015    Assessment & Plan:   1. DM (diabetes mellitus), type 2 with neurological complications ([place]) Not fully optimized with A1c of 7.5 Followed by [facility] She remains on gabapentin for neuropathy which she should be taking the Cymbalta but has not been; advised to resume Cymbalta. - Glucose (CBG) - Insulin Glargine (LANTUS SOLOSTAR) 100 UNIT/ML Solostar Pen; Inject 30 Units into the skin every morning.  Dispense: 10 pen; Refill: 1 - Liraglutide (VICTOZA) 18 MG/3ML SOPN; Inject 0.2 mLs (1.2 mg total) into the skin at bedtime.  Dispense: 6 pen; Refill: 1 - insulin aspart (NOVOLOG FLEXPEN) 100 UNIT/ML FlexPen; Inject 12 Units into the skin 3 (three) times daily with meals.  Dispense: 10 pen; Refill: 2 - metFORMIN (GLUCOPHAGE) 1000 MG tablet; Take 1 tablet (1,000 mg total) by mouth 2 (two) times daily with a meal.  Dispense: 180 tablet; Refill: 1 - gabapentin (NEURONTIN) 400 MG capsule; Take 2 capsules (800 mg total) by mouth 3 (three) times daily.  Dispense: 360 capsule; Refill: 1 - glucose blood (ACCU-CHEK AVIVA PLUS) test strip; Use as instructed to check blood sugar 2 times per day dx code E11.40  Dispense: 100 each; Refill: 3 - ACCU-CHEK SOFTCLIX LANCETS lancets; Use as instructed to check blood sugar 2 times per day dx code E11.40  Dispense: 100 each; Refill: 3 - atorvastatin (LIPITOR) 20 MG tablet; Take 1 tablet (20 mg total) by mouth daily.  Dispense: 90 tablet; Refill: 1  2. Essential hypertension, benign Controlled Low-sodium diet - lisinopril (PRINIVIL,ZESTRIL) 2.5 MG tablet; Take 1 tablet (2.5 mg total) by mouth daily.  Dispense: 90 tablet; Refill: 1  3. Coronary artery disease with unspecified angina pectoris Status post PCI with DES 3, PCI of LCx Risk factor modification No angina at this time - clopidogrel (PLAVIX) 75 MG tablet; Take 1 tablet (75 mg total) by mouth daily.  Dispense: 90 tablet; Refill: 3 - carvedilol (COREG) 3.125 MG tablet; Take 1 tablet (3.125 mg total) by mouth 2 (two) times daily with a meal.  Dispense: 180 tablet; Refill: 1 - atorvastatin (LIPITOR) 20 MG tablet; Take 1 tablet (20 mg total) by  mouth daily.  Dispense: 90 tablet; Refill: 1  4. Dyslipidemia, goal LDL below 100 Continues atorvastatin  5. Tinea manuum Placed on topical antifungal   Meds ordered this encounter  Medications  . Insulin Glargine (LANTUS SOLOSTAR) 100 UNIT/ML Solostar Pen    Sig: Inject 30 Units into the skin  every morning.    Dispense:  10 pen    Refill:  1  . Liraglutide (VICTOZA) 18 MG/3ML SOPN    Sig: Inject 0.2 mLs (1.2 mg total) into the skin at bedtime.    Dispense:  6 pen    Refill:  1  . insulin aspart (NOVOLOG FLEXPEN) 100 UNIT/ML FlexPen    Sig: Inject 12 Units into the skin 3 (three) times daily with meals.    Dispense:  10 pen    Refill:  2  . metFORMIN (GLUCOPHAGE) 1000 MG tablet    Sig: Take 1 tablet (1,000 mg total) by mouth 2 (two) times daily with a meal.    Dispense:  180 tablet    Refill:  1  . lisinopril (PRINIVIL,ZESTRIL) 2.5 MG tablet    Sig: Take 1 tablet (2.5 mg total) by mouth daily.    Dispense:  90 tablet    Refill:  1  . gabapentin (NEURONTIN) 400 MG capsule    Sig: Take 2 capsules (800 mg total) by mouth 3 (three) times daily.    Dispense:  360 capsule    Refill:  1  . DULoxetine (CYMBALTA) 30 MG capsule    Sig: Take 1 capsule (30 mg total) by mouth daily. With food    Dispense:  90 capsule    Refill:  1  . clopidogrel (PLAVIX) 75 MG tablet    Sig: Take 1 tablet (75 mg total) by mouth daily.    Dispense:  90 tablet    Refill:  3  . carvedilol (COREG) 3.125 MG tablet    Sig: Take 1 tablet (3.125 mg total) by mouth 2 (two) times daily with a meal.    Dispense:  180 tablet    Refill:  1  . canagliflozin (INVOKANA) 100 MG TABS tablet    Sig: Take 1 tablet (100 mg total) by mouth daily before breakfast.    Dispense:  90 tablet    Refill:  1  . glucose blood (ACCU-CHEK AVIVA PLUS) test strip    Sig: Use as instructed to check blood sugar 2 times per day dx code E11.40    Dispense:  100 each    Refill:  3  . ACCU-CHEK SOFTCLIX LANCETS lancets    Sig: Use  as instructed to check blood sugar 2 times per day dx code E11.40    Dispense:  100 each    Refill:  3  . atorvastatin (LIPITOR) 20 MG tablet    Sig: Take 1 tablet (20 mg total) by mouth daily.    Dispense:  90 tablet    Refill:  1  . clotrimazole (LOTRIMIN) 1 % cream    Sig: Apply 1 application topically 2 (two) times daily.    Dispense:  60 g    Refill:  1    Follow-up: Return in about 3 months (around 12/28/2015) for follow up on Diabetes Mellitus  with PCP- Dr Adrian Blackwater.   Arnoldo Morale MD

## 2015-09-28 NOTE — Progress Notes (Signed)
Dry skin fingertips Numbness in hands and feet in morning Medication refill- requesting 3 months

## 2015-10-09 ENCOUNTER — Other Ambulatory Visit: Payer: Self-pay | Admitting: *Deleted

## 2015-10-09 ENCOUNTER — Encounter: Payer: Self-pay | Admitting: Physician Assistant

## 2015-10-09 DIAGNOSIS — E1149 Type 2 diabetes mellitus with other diabetic neurological complication: Secondary | ICD-10-CM

## 2015-10-09 MED ORDER — LIRAGLUTIDE 18 MG/3ML ~~LOC~~ SOPN
1.2000 mg | PEN_INJECTOR | Freq: Every day | SUBCUTANEOUS | 1 refills | Status: DC
Start: 2015-10-09 — End: 2015-10-26

## 2015-10-09 NOTE — Progress Notes (Signed)
Cardiology Office Note    Date:  10/10/2015  ID:  Kathleen Holt, DOB 03-Sep-1955, MRN [id number] PCP:  Minerva Ends, MD  Cardiologist:  Radford Pax  Chief Complaint: f/u heart disease  History of Present Illness:  Kathleen Holt is a 60 y.o. female with history of CAD (prior stenting x3 in [place] 2011, DEsx3 to RCA 12/2014, elective CTO of Cx 01/2015), DM, neuropathy, HLD, ischemic cardiomyopathy (EF 45-50%) who presents for f/u. 2D echo 03/02/15: EF 45-50%, grade 1 DD. Last labs 07/2015 showed Hgb 11.6, TSH wnl, Cr 1.02. LDL 58 in 06/2015. DAPT beyond one year was recommended at last cath given long areas of stent.  She returns for follow-up today with her husband. They both report she is feeling very well. She has not had any cardiac symptoms at all. No chest pain, SOB, palpitations, LEE, orthopnea. She is walking 10-20 minutes a day without any limitation.  Past Medical History:  Diagnosis Date  . Chronic constipation   . Chronic lower back pain   . Coronary artery disease    a. s/p stenting 11/2009 [facility] - PCI of the RCA  and LCX. b. 12/26/2014: Dist RCA 90% stenosed and 100% stenosis of the Prox to Mid-Cx. with 3 DES stents placed to RCA with residual LAD disease of 40%.  4.  Repeat cath 01/2015 with CTO of 100% instent restenosis of the LCX.  . Diabetic neuropathy ([place])   . Dyslipidemia, goal LDL below 70 02/11/2012  . Hyperlipidemia   . Ischemic dilated cardiomyopathy    a. Echo 2/17:  EF 45-50%, inf-lat AK, Gr 1 DD  . Mitral regurgitation    Mild, echo, January, 2014  . Myocardial infarction ([place]) 2011  . Type II diabetes mellitus ([place])     Past Surgical History:  Procedure Laterality Date  . CARDIAC CATHETERIZATION N/A 12/26/2014   Procedure: Left Heart Cath and Coronary Angiography;  Surgeon: Jettie Booze, MD;  Location: [place] [facility];  Service: Cardiovascular;  Laterality: N/A;  . CARDIAC CATHETERIZATION N/A 12/26/2014   Procedure: Coronary Stent Intervention;  Surgeon: Jettie Booze, MD;  Location: [place] [facility];  Service: Cardiovascular; 3 overlapping Synergy drug-eluting stents, 2.25 x 16, 2.5 x 38 and 3.0 x 32 to the RCA  . CARDIAC CATHETERIZATION N/A 02/08/2015   Procedure: Coronary/Bypass Graft CTO Intervention;  Surgeon: Jettie Booze, MD;  Location: [place] [facility];  Service: Cardiovascular;  Laterality: N/A;  . CARDIAC CATHETERIZATION  02/08/2015   Procedure: Left Heart Cath and Coronary Angiography;  Surgeon: Jettie Booze, MD;  Location: [place] [facility];  Service: Cardiovascular;;  . CATARACT EXTRACTION W/ INTRAOCULAR LENS  IMPLANT, BILATERAL    . CORONARY ANGIOPLASTY WITH STENT PLACEMENT  2011; 12/26/2014  . EYE SURGERY    . VAGINAL HYSTERECTOMY      Current Medications: Current Outpatient Prescriptions  Medication Sig Dispense Refill  . ACCU-CHEK SOFTCLIX LANCETS lancets Use as instructed to check blood sugar 2 times per day dx code E11.40 100 each 3  . aspirin 81 MG tablet Take 1 tablet (81 mg total) by mouth daily. 30 tablet 11  . atorvastatin (LIPITOR) 20 MG tablet Take 1 tablet (20 mg total) by mouth daily. 90 tablet 1  . Blood Glucose Monitoring Suppl (ACCU-CHEK AVIVA PLUS) w/Device KIT Use to check blood sugar 2 times per day dx code E11.40 1 kit 0  . canagliflozin (INVOKANA) 100 MG TABS tablet Take 1 tablet (100  mg total) by mouth daily before breakfast. 90 tablet 1  . carvedilol (COREG) 3.125 MG tablet Take 1 tablet (3.125 mg total) by mouth 2 (two) times daily with a meal. 180 tablet 1  . clopidogrel (PLAVIX) 75 MG tablet Take 1 tablet (75 mg total) by mouth daily. 90 tablet 3  . DULoxetine (CYMBALTA) 30 MG capsule Take 1 capsule (30 mg total) by mouth daily. With food 90 capsule 1  . ferrous sulfate 325 (65 FE) MG tablet Take 1 tablet (325 mg total) by mouth 2 (two) times daily with a meal. 60 tablet 3  . gabapentin (NEURONTIN) 400 MG capsule Take 2 capsules  (800 mg total) by mouth 3 (three) times daily. 360 capsule 1  . glucose blood (ACCU-CHEK AVIVA PLUS) test strip Use as instructed to check blood sugar 2 times per day dx code E11.40 100 each 3  . insulin aspart (NOVOLOG FLEXPEN) 100 UNIT/ML FlexPen Inject 12 Units into the skin 3 (three) times daily with meals. 10 pen 2  . Insulin Glargine (LANTUS SOLOSTAR) 100 UNIT/ML Solostar Pen Inject 30 Units into the skin every morning. 10 pen 1  . Insulin Pen Needle 31G X 5 MM MISC Use as directed for insulin injection. 200 each 5  . Liraglutide (VICTOZA) 18 MG/3ML SOPN Inject 0.2 mLs (1.2 mg total) into the skin at bedtime. 6 pen 1  . lisinopril (PRINIVIL,ZESTRIL) 2.5 MG tablet Take 1 tablet (2.5 mg total) by mouth daily. 90 tablet 1  . metFORMIN (GLUCOPHAGE) 1000 MG tablet Take 1 tablet (1,000 mg total) by mouth 2 (two) times daily with a meal. 180 tablet 1  . nitroGLYCERIN (NITROSTAT) 0.4 MG SL tablet Place 1 tablet (0.4 mg total) under the tongue every 5 (five) minutes x 3 doses as needed for chest pain. 25 tablet 3  . Polyethyl Glycol-Propyl Glycol 0.4-0.3 % SOLN Place 1 drop into both eyes every 3 (three) hours as needed (FOR DRYNESS).     . Vitamin D, Ergocalciferol, (DRISDOL) 50000 units CAPS capsule Take 1 capsule (50,000 Units total) by mouth every 30 (thirty) days. 4 capsule 11   No current facility-administered medications for this visit.      Allergies:   Review of patient's allergies indicates no known allergies.   Social History   Social History  . Marital status: Married    Spouse name: N/A  . Number of children: 3  . Years of education: N/A   Occupational History  . Housewife    Social History Main Topics  . Smoking status: Never Smoker  . Smokeless tobacco: Never Used  . Alcohol use No  . Drug use: No  . Sexual activity: Yes    Birth control/ protection: Post-menopausal   Other Topics Concern  . None   Social History Narrative  . None     Family History:  The  patient's family history includes CAD in her brother and father; Cancer in her father; Diabetes in her father; Heart attack in her brother and father; Hypertension in her father.   ROS:   Please see the history of present illness. All other systems are reviewed and otherwise negative.    PHYSICAL EXAM:   VS:  BP 140/80   Pulse 74   Ht 5' (1.524 m)   Wt 188 lb 12.8 oz (85.6 kg)   BMI 36.87 kg/m   BMI: Body mass index is 36.87 kg/m. GEN: Well nourished, well developed obese [place] female, in no acute distress  HEENT: normocephalic, atraumatic  Neck: no JVD, carotid bruits, or masses Cardiac: RRR; no murmurs, rubs, or gallops, no edema  Respiratory:  clear to auscultation bilaterally, normal work of breathing GI: soft, nontender, nondistended, + BS MS: no deformity or atrophy  Skin: warm and dry, no rash Neuro:  Alert and Oriented x 3, Strength and sensation are intact, follows commands Psych: euthymic mood, full affect  Wt Readings from Last 3 Encounters:  10/10/15 188 lb 12.8 oz (85.6 kg)  09/28/15 190 lb 12.8 oz (86.5 kg)  08/07/15 192 lb (87.1 kg)      Studies/Labs Reviewed:   EKG:  EKG was ordered today and personally reviewed by me and demonstrates NSR 74bpm, low voltage QRS, TWI avL, no acute changes from prior.  Recent Labs: 08/02/2015: ALT 17; BUN 18; Creatinine, Ser 1.02; Hemoglobin 11.6; Platelets 375.0; Potassium 5.1; Sodium 137; TSH 2.77   Lipid Panel    Component Value Date/Time   CHOL 132 06/22/2015 1054   TRIG 161 (H) 06/22/2015 1054   HDL 42 (L) 06/22/2015 1054   CHOLHDL 3.1 06/22/2015 1054   VLDL 32 (H) 06/22/2015 1054   [place] 58 06/22/2015 1054    Additional studies/ records that were reviewed today include: Summarized above.    ASSESSMENT & PLAN:   1. CAD - no recent angina symptoms. As above, will likely need longterm DAPT. No bleeding issues on ASA and Plavix thus far. Continue BB, statin. 2. Ischemic cardiomyopathy/chronic systolic  dysfunction - no evidence of CHF on exam. Continue BB and lisinopril. Appears euvolemic. 3. Hyperlipidemia - LDL was 58 in 06/2015. Continue statin. Will defer to primary cardiologist regarding whether she wants to titrate upward further to higher dose at next OV. 4. Type 2 DM - followed by Dr. Dwyane Dee. 5. Morbid obesity - we discussed importance of further increasing her physical activity to help strengthen her heart, control her DM and hyperlipidemia, and reduce her weight. Reviewed possibility of adding brief bursts of interval training as tolerated to improve calorie burn.  Disposition: F/u with Dr. Radford Pax in 6 months.  Medication Adjustments/Labs and Tests Ordered: Current medicines are reviewed at length with the patient today.  Concerns regarding medicines are outlined above. Medication changes, Labs and Tests ordered today are summarized above and listed in the Patient Instructions accessible in Encounters.   Raechel Ache PA-C  10/10/2015 9:32 AM    [address], [place], [place]  [address] Phone: [phone]; Fax: [phone]

## 2015-10-10 ENCOUNTER — Encounter: Payer: Self-pay | Admitting: Physician Assistant

## 2015-10-10 ENCOUNTER — Encounter (INDEPENDENT_AMBULATORY_CARE_PROVIDER_SITE_OTHER): Payer: Self-pay

## 2015-10-10 ENCOUNTER — Ambulatory Visit (INDEPENDENT_AMBULATORY_CARE_PROVIDER_SITE_OTHER): Payer: Medicaid Other | Admitting: Physician Assistant

## 2015-10-10 VITALS — BP 140/80 | HR 74 | Ht 60.0 in | Wt 188.8 lb

## 2015-10-10 DIAGNOSIS — E1142 Type 2 diabetes mellitus with diabetic polyneuropathy: Secondary | ICD-10-CM

## 2015-10-10 DIAGNOSIS — I2510 Atherosclerotic heart disease of native coronary artery without angina pectoris: Secondary | ICD-10-CM | POA: Diagnosis not present

## 2015-10-10 DIAGNOSIS — I255 Ischemic cardiomyopathy: Secondary | ICD-10-CM | POA: Diagnosis not present

## 2015-10-10 DIAGNOSIS — E785 Hyperlipidemia, unspecified: Secondary | ICD-10-CM | POA: Diagnosis not present

## 2015-10-10 NOTE — Patient Instructions (Signed)
Medication Instructions:  Your physician recommends that you continue on your current medications as directed. Please refer to the Current Medication list given to you today.   Labwork: none  Testing/Procedures: none  Follow-Up: Your physician wants you to follow-up in: 6 months with Dr. Radford Pax. You will receive a reminder letter in the mail two months in advance. If you don't receive a letter, please call our office to schedule the follow-up appointment.   Any Other Special Instructions Will Be Listed Below (If Applicable).     If you need a refill on your cardiac medications before your next appointment, please call your pharmacy.

## 2015-10-16 ENCOUNTER — Encounter: Payer: Self-pay | Admitting: Pharmacist

## 2015-10-16 NOTE — Progress Notes (Signed)
Received a letter from [facility] will not be covered unless patient has failed Byetta and Bydureon, despite the fact that she has already been on it.  Will forward this information to Dr. Dwyane Dee, her endocrinologist.

## 2015-10-26 ENCOUNTER — Other Ambulatory Visit: Payer: Self-pay

## 2015-10-26 DIAGNOSIS — E1149 Type 2 diabetes mellitus with other diabetic neurological complication: Secondary | ICD-10-CM

## 2015-10-26 MED ORDER — LIRAGLUTIDE 18 MG/3ML ~~LOC~~ SOPN: 1.2000 mg | PEN_INJECTOR | Freq: Every day | SUBCUTANEOUS | 1 refills | Status: DC

## 2015-10-31 ENCOUNTER — Other Ambulatory Visit (INDEPENDENT_AMBULATORY_CARE_PROVIDER_SITE_OTHER): Payer: Medicaid Other

## 2015-10-31 DIAGNOSIS — E1149 Type 2 diabetes mellitus with other diabetic neurological complication: Secondary | ICD-10-CM | POA: Diagnosis not present

## 2015-10-31 DIAGNOSIS — D510 Vitamin B12 deficiency anemia due to intrinsic factor deficiency: Secondary | ICD-10-CM

## 2015-10-31 LAB — HEMOGLOBIN A1C: Hgb A1c MFr Bld: 6.9 % — ABNORMAL HIGH (ref 4.6–6.5)

## 2015-10-31 LAB — MICROALBUMIN / CREATININE URINE RATIO
Creatinine,U: 102.7 mg/dL
MICROALB UR: 1 mg/dL (ref 0.0–1.9)
Microalb Creat Ratio: 1 mg/g (ref 0.0–30.0)

## 2015-10-31 LAB — CBC
HEMATOCRIT: 36.9 % (ref 36.0–46.0)
Hemoglobin: 12 g/dL (ref 12.0–15.0)
MCHC: 32.4 g/dL (ref 30.0–36.0)
MCV: 71.4 fl — ABNORMAL LOW (ref 78.0–100.0)
PLATELETS: 325 10*3/uL (ref 150.0–400.0)
RBC: 5.16 Mil/uL — AB (ref 3.87–5.11)
RDW: 17.6 % — ABNORMAL HIGH (ref 11.5–15.5)
WBC: 8.9 10*3/uL (ref 4.0–10.5)

## 2015-10-31 LAB — BASIC METABOLIC PANEL
BUN: 17 mg/dL (ref 6–23)
CHLORIDE: 103 meq/L (ref 96–112)
CO2: 28 meq/L (ref 19–32)
Calcium: 9.1 mg/dL (ref 8.4–10.5)
Creatinine, Ser: 1.04 mg/dL (ref 0.40–1.20)
GFR: 56.97 mL/min — ABNORMAL LOW (ref >60.00)
GLUCOSE: 93 mg/dL (ref 70–99)
POTASSIUM: 5 meq/L (ref 3.5–5.1)
Sodium: 137 mEq/L (ref 135–145)

## 2015-11-03 ENCOUNTER — Other Ambulatory Visit: Payer: Medicaid Other

## 2015-11-07 ENCOUNTER — Encounter: Payer: Self-pay | Admitting: Endocrinology

## 2015-11-07 ENCOUNTER — Ambulatory Visit: Payer: Medicaid Other | Admitting: Endocrinology

## 2015-11-07 ENCOUNTER — Ambulatory Visit (INDEPENDENT_AMBULATORY_CARE_PROVIDER_SITE_OTHER): Payer: Medicaid Other | Admitting: Endocrinology

## 2015-11-07 VITALS — BP 124/60 | HR 83 | Temp 97.7°F | Resp 16 | Ht 60.0 in | Wt 185.2 lb

## 2015-11-07 DIAGNOSIS — I10 Essential (primary) hypertension: Secondary | ICD-10-CM | POA: Diagnosis not present

## 2015-11-07 DIAGNOSIS — E1140 Type 2 diabetes mellitus with diabetic neuropathy, unspecified: Secondary | ICD-10-CM | POA: Diagnosis not present

## 2015-11-07 DIAGNOSIS — D510 Vitamin B12 deficiency anemia due to intrinsic factor deficiency: Secondary | ICD-10-CM | POA: Diagnosis not present

## 2015-11-07 DIAGNOSIS — E1165 Type 2 diabetes mellitus with hyperglycemia: Secondary | ICD-10-CM | POA: Diagnosis not present

## 2015-11-07 DIAGNOSIS — Z794 Long term (current) use of insulin: Secondary | ICD-10-CM

## 2015-11-07 MED ORDER — DULOXETINE HCL 30 MG PO CPEP: 30.0000 mg | ORAL_CAPSULE | Freq: Every day | ORAL | 2 refills | Status: DC

## 2015-11-07 NOTE — Patient Instructions (Addendum)
Lantus 28 units  Novolog 14 units before meals  Check blood sugars on waking up  3x weekly  Also check blood sugars about 2 hours after a meal and do this after different meals by rotation  Recommended blood sugar levels on waking up is 90-130 and about 2 hours after meal is 130-160  Please bring your blood sugar monitor to each visit, thank you

## 2015-11-07 NOTE — Progress Notes (Signed)
Patient ID: Kathleen Holt, female   DOB: [birth year], 60 y.o.   MRN: [id number]    Reason for Appointment: Follow-up of type 2 Diabetes  History of Present Illness:          Diagnosis: Type 2 diabetes mellitus, date of diagnosis: 2005        Past history: The diabetes was diagnosed about 10 years ago and she was probably treated with metformin and other oral agents for at least 6 years. Previously she had tried metformin, Januvia and Amaryl When she was admitted for coronary disease in 2011 in [place] she was switched to insulin and oral agents. Not clear how her control has been in the past She has been on Lantus and NovoLog insulin since 2011 She also had tried [place] with her insulin for sometime but not clear if it was helping her  Because of her poor control and persistently high A1c of 10.1% in 04/2013 her metformin was increased to maximum dose. Also she was started on Victoza but she was unable to tolerate the 1.2 mg dosage since she had decreased appetite and abdominal distress  Recent history:   INSULIN regimen is described as:  Lantus 30 at am, NovoLog 14 units ac with meals  Her A1c is now finally below 7% at 6.9, previously 7.5  Currently on basal bolus insulin regimen with Lantus insulin in the mornings She was also started on Invokana on her last visit on 04/20/15  Current blood sugar patterns and problems identified:  She is checking blood sugars either in the morning or late evening around bedtime and occasionally during the night  She has lost some weight even though she is not able to exercise much  Although she was supposed to take 16 units of Novolog at suppertime she is taking only 14  She has no reported hypoglycemic symptoms    FASTING readings are mostly near normal and averaging 111.  Again forgets to check her blood sugars after breakfast or lunch and not 2 hours after eating dinner  Does not have consistent protein with every meal, mostly  eats vegetables and bread      She did not tolerate 1.2 mg Victoza previously, currently taking 0.9 mg   Oral hypoglycemic drugs the patient is taking are: Metformin 2 g daily, Invokana 100 mg daily      Side effects from medications have been: abdominal distress and anorexia with 1.2 Victoza  Glucose monitoring:  done once or twice daily        Glucometer: Accu-Chek  Blood Glucose readings from download:  Mean values apply above for all meters except median for One Touch  PRE-MEAL Fasting Lunch Dinner Bedtime Overall  Glucose range: 95-127    69-168    Mean/median: 111    121      Glycemic control  Lab Results  Component Value Date   HGBA1C 6.9 (H) 10/31/2015   HGBA1C 7.5 (H) 08/02/2015   HGBA1C 7.6 (H) 04/17/2015   Lab Results  Component Value Date   MICROALBUR 1.0 10/31/2015   LDLCALC 58 06/22/2015   CREATININE 1.04 10/31/2015    Self-care: The diet that the patient has been following is: None, usually vegetarian, Small portions       Exercise: She is trying to walk more, generally 7-10k steps Dietician visit:  none              Compliance with the medical regimen: Improving  Weight history:  Wt Readings from  Last 3 Encounters:  11/07/15 185 lb 3.2 oz (84 kg)  10/10/15 188 lb 12.8 oz (85.6 kg)  09/28/15 190 lb 12.8 oz (86.5 kg)    OTHER active problems: See review of systems   No visits with results within 1 Week(s) from this visit.  Latest known visit with results is:  Lab on 10/31/2015  Component Date Value Ref Range Status  . Hgb A1c MFr Bld 10/31/2015 6.9* 4.6 - 6.5 % Final  . Sodium 10/31/2015 137  135 - 145 mEq/L Final  . Potassium 10/31/2015 5.0  3.5 - 5.1 mEq/L Final  . Chloride 10/31/2015 103  96 - 112 mEq/L Final  . CO2 10/31/2015 28  19 - 32 mEq/L Final  . Glucose, Bld 10/31/2015 93  70 - 99 mg/dL Final  . BUN 10/31/2015 17  6 - 23 mg/dL Final  . Creatinine, Ser 10/31/2015 1.04  0.40 - 1.20 mg/dL Final  . Calcium 10/31/2015 9.1  8.4 - 10.5  mg/dL Final  . GFR 10/31/2015 56.97* >60.00 mL/min Final  . Microalb, Ur 10/31/2015 1.0  0.0 - 1.9 mg/dL Final  . Creatinine,U 10/31/2015 102.7  mg/dL Final  . Microalb Creat Ratio 10/31/2015 1.0  0.0 - 30.0 mg/g Final  . WBC 10/31/2015 8.9  4.0 - 10.5 K/uL Final  . RBC 10/31/2015 5.16* 3.87 - 5.11 Mil/uL Final  . Platelets 10/31/2015 325.0  150.0 - 400.0 K/uL Final  . Hemoglobin 10/31/2015 12.0  12.0 - 15.0 g/dL Final  . HCT 10/31/2015 36.9  36.0 - 46.0 % Final  . MCV 10/31/2015 71.4* 78.0 - 100.0 fl Final  . MCHC 10/31/2015 32.4  30.0 - 36.0 g/dL Final  . RDW 10/31/2015 17.6* 11.5 - 15.5 % Final      Medication List       Accurate as of 11/07/15  4:15 PM. Always use your most recent med list.          ACCU-CHEK AVIVA PLUS w/Device Kit Use to check blood sugar 2 times per day dx code E11.40   ACCU-CHEK SOFTCLIX LANCETS lancets Use as instructed to check blood sugar 2 times per day dx code E11.40   aspirin 81 MG tablet Take 1 tablet (81 mg total) by mouth daily.   atorvastatin 20 MG tablet Commonly known as:  LIPITOR Take 1 tablet (20 mg total) by mouth daily.   canagliflozin 100 MG Tabs tablet Commonly known as:  INVOKANA Take 1 tablet (100 mg total) by mouth daily before breakfast.   carvedilol 3.125 MG tablet Commonly known as:  COREG Take 1 tablet (3.125 mg total) by mouth 2 (two) times daily with a meal.   clopidogrel 75 MG tablet Commonly known as:  PLAVIX Take 1 tablet (75 mg total) by mouth daily.   DULoxetine 30 MG capsule Commonly known as:  CYMBALTA Take 1 capsule (30 mg total) by mouth daily. With food   ferrous sulfate 325 (65 FE) MG tablet Take 1 tablet (325 mg total) by mouth 2 (two) times daily with a meal.   gabapentin 400 MG capsule Commonly known as:  NEURONTIN Take 2 capsules (800 mg total) by mouth 3 (three) times daily.   glucose blood test strip Commonly known as:  ACCU-CHEK AVIVA PLUS Use as instructed to check blood sugar 2 times  per day dx code E11.40   insulin aspart 100 UNIT/ML FlexPen Commonly known as:  NOVOLOG FLEXPEN Inject 12 Units into the skin 3 (three) times daily with meals.   Insulin Glargine  100 UNIT/ML Solostar Pen Commonly known as:  LANTUS SOLOSTAR Inject 30 Units into the skin every morning.   Insulin Pen Needle 31G X 5 MM Misc Use as directed for insulin injection.   liraglutide 18 MG/3ML Sopn Commonly known as:  VICTOZA Inject 0.2 mLs (1.2 mg total) into the skin at bedtime.   lisinopril 2.5 MG tablet Commonly known as:  PRINIVIL,ZESTRIL Take 1 tablet (2.5 mg total) by mouth daily.   metFORMIN 1000 MG tablet Commonly known as:  GLUCOPHAGE Take 1 tablet (1,000 mg total) by mouth 2 (two) times daily with a meal.   nitroGLYCERIN 0.4 MG SL tablet Commonly known as:  NITROSTAT Place 1 tablet (0.4 mg total) under the tongue every 5 (five) minutes x 3 doses as needed for chest pain.   Polyethyl Glycol-Propyl Glycol 0.4-0.3 % Soln Place 1 drop into both eyes every 3 (three) hours as needed (FOR DRYNESS).   Vitamin D (Ergocalciferol) 50000 units Caps capsule Commonly known as:  DRISDOL Take 1 capsule (50,000 Units total) by mouth every 30 (thirty) days.       Allergies: No Known Allergies  Past Medical History:  Diagnosis Date  . Chronic constipation   . Chronic lower back pain   . Coronary artery disease    a. s/p stenting 11/2009 [facility] - PCI of the RCA  and LCX. b. 12/26/2014: Dist RCA 90% stenosed and 100% stenosis of the Prox to Mid-Cx. with 3 DES stents placed to RCA with residual LAD disease of 40%.  4.  Repeat cath 01/2015 with CTO of 100% instent restenosis of the LCX.  . Diabetic neuropathy ([place])   . Dyslipidemia, goal LDL below 70 02/11/2012  . Hyperlipidemia   . Ischemic dilated cardiomyopathy ([place])    a. Echo 2/17:  EF 45-50%, inf-lat AK, Gr 1 DD  . Mitral regurgitation    Mild, echo, January, 2014  . Myocardial infarction 2011  .  Type II diabetes mellitus ([place])     Past Surgical History:  Procedure Laterality Date  . CARDIAC CATHETERIZATION N/A 12/26/2014   Procedure: Left Heart Cath and Coronary Angiography;  Surgeon: Jettie Booze, MD;  Location: [place] [facility];  Service: Cardiovascular;  Laterality: N/A;  . CARDIAC CATHETERIZATION N/A 12/26/2014   Procedure: Coronary Stent Intervention;  Surgeon: Jettie Booze, MD;  Location: [place] [facility];  Service: Cardiovascular; 3 overlapping Synergy drug-eluting stents, 2.25 x 16, 2.5 x 38 and 3.0 x 32 to the RCA  . CARDIAC CATHETERIZATION N/A 02/08/2015   Procedure: Coronary/Bypass Graft CTO Intervention;  Surgeon: Jettie Booze, MD;  Location: [facility];  Service: Cardiovascular;  Laterality: N/A;  . CARDIAC CATHETERIZATION  02/08/2015   Procedure: Left Heart Cath and Coronary Angiography;  Surgeon: Jettie Booze, MD;  Location: [place] [facility];  Service: Cardiovascular;;  . CATARACT EXTRACTION W/ INTRAOCULAR LENS  IMPLANT, BILATERAL    . CORONARY ANGIOPLASTY WITH STENT PLACEMENT  2011; 12/26/2014  . EYE SURGERY    . VAGINAL HYSTERECTOMY      Family History  Problem Relation Age of Onset  . CAD Father     MI at age 18  . Diabetes Father   . Hypertension Father   . Cancer Father   . Heart attack Father   . CAD Brother   . Heart attack Brother   . Stroke Neg Hx     Social History:  reports that she has never smoked. She has  never used smokeless tobacco. She reports that she does not drink alcohol or use drugs.    Review of Systems   Eye exam: Last in  3/16  Her anemia is improved, this is related to B12 and iron deficiency  She Had a normal B12 1 last measurement   Lab Results  Component Value Date   WBC 8.9 10/31/2015   HGB 12.0 10/31/2015   HCT 36.9 10/31/2015   MCV 71.4 (L) 10/31/2015   PLT 325.0 10/31/2015    HYPERTENSION: This is fairly minimal and is only on minimal doses of lisinopril and Coreg with  good control      Lipids: Has mixed dyslipidemia. On Crestor previously but now is on Lipitor 20 mg from her PCP, does have known coronary artery disease      Lab Results  Component Value Date   CHOL 132 06/22/2015   HDL 42 (L) 06/22/2015   LDLCALC 58 06/22/2015   TRIG 161 (H) 06/22/2015   CHOLHDL 3.1 06/22/2015        She has a long-standing history of Numbness, tingling, Burning in her feet treated with Lyrica, previously on gabapentin  She does not think Gabapentin 800 mg 3 times a day has been helping her even though it was restarted in the last visit Also Lyrica does not work as well She thinks her symptoms are affecting her sleep recently  Has history of vitamin D deficiency  Physical Examination:  BP 124/60   Pulse 83   Temp 97.7 F (36.5 C)   Resp 16   Ht 5' (1.524 m)   Wt 185 lb 3.2 oz (84 kg)   SpO2 97%   BMI 36.17 kg/m       ASSESSMENT:  Diabetes type 2, uncontrolled with marked obesity and BMI  39 See history of present illness for detailed discussion of current management, blood sugar patterns and problems identified  Her A1c is surprisingly better even though she has not changed any of her insulin doses However she has started losing weight and may be able to be more active than before She is checking blood sugars mostly fasting and sporadically at bedtime despite instructions on postprandial monitoring Not adjusting her mealtime doses based on what she is eating Currently benefiting from [place] with no side effects or abnormal renal functions or potassium  NEUROPATHY: She is having persistent symptoms and not benefiting from gabapentin She was supposed to have started on Cymbalta previously but has not apparently picked up the prescription for unknown reasons   ANEMIA: She needs to follow-up with PCP, recent hemoglobin is back to normal at 12   PLAN:   No change in Invokana doses  Try to check blood sugars at various times of the day, mostly 2  hours after meals and less in the morning  She will need to try reducing her insulin dose by 2 units all around   Again reminded her to bring her monitor for download on the next visit  Discuss possibly cutting back on Lantus further  Add a vegetarian sources of protein that were discussed today to all the meals  Encouraged her to walk more for weight loss  Cymbalta 30 mg daily with food and if improving symptoms will increase to 60 mg after a month  May take gabapentin at bedtime only    Patient Instructions  Lantus 28 units  Novolog 14 units before meals  Check blood sugars on waking up  3x weekly  Also check blood  sugars about 2 hours after a meal and do this after different meals by rotation  Recommended blood sugar levels on waking up is 90-130 and about 2 hours after meal is 130-160  Please bring your blood sugar monitor to each visit, thank you    Counseling time on subjects discussed above is over 50% of today's 25 minute visit  Devesh Monforte 11/07/2015, 4:15 PM

## 2015-12-18 ENCOUNTER — Telehealth: Payer: Self-pay | Admitting: Family Medicine

## 2015-12-18 NOTE — Telephone Encounter (Signed)
Patient called stated having constipation for 3 days. She is taking constipation pills but not working.   Would like to speak to nurse to discuss other option for constipation  Please follow up with patient

## 2016-01-04 ENCOUNTER — Ambulatory Visit: Payer: Medicaid Other | Admitting: Family Medicine

## 2016-01-26 ENCOUNTER — Ambulatory Visit: Payer: Medicaid Other | Attending: Family Medicine | Admitting: Family Medicine

## 2016-01-26 ENCOUNTER — Encounter: Payer: Self-pay | Admitting: Family Medicine

## 2016-01-26 VITALS — BP 127/80 | HR 90 | Temp 97.4°F | Ht 60.0 in | Wt 187.2 lb

## 2016-01-26 DIAGNOSIS — E559 Vitamin D deficiency, unspecified: Secondary | ICD-10-CM | POA: Diagnosis not present

## 2016-01-26 DIAGNOSIS — Z23 Encounter for immunization: Secondary | ICD-10-CM

## 2016-01-26 DIAGNOSIS — Z794 Long term (current) use of insulin: Secondary | ICD-10-CM | POA: Insufficient documentation

## 2016-01-26 DIAGNOSIS — I25119 Atherosclerotic heart disease of native coronary artery with unspecified angina pectoris: Secondary | ICD-10-CM | POA: Insufficient documentation

## 2016-01-26 DIAGNOSIS — K5900 Constipation, unspecified: Secondary | ICD-10-CM | POA: Diagnosis not present

## 2016-01-26 DIAGNOSIS — Z7982 Long term (current) use of aspirin: Secondary | ICD-10-CM | POA: Diagnosis not present

## 2016-01-26 DIAGNOSIS — E1149 Type 2 diabetes mellitus with other diabetic neurological complication: Secondary | ICD-10-CM | POA: Diagnosis not present

## 2016-01-26 DIAGNOSIS — L853 Xerosis cutis: Secondary | ICD-10-CM | POA: Insufficient documentation

## 2016-01-26 DIAGNOSIS — Z7902 Long term (current) use of antithrombotics/antiplatelets: Secondary | ICD-10-CM | POA: Insufficient documentation

## 2016-01-26 DIAGNOSIS — Z79899 Other long term (current) drug therapy: Secondary | ICD-10-CM | POA: Insufficient documentation

## 2016-01-26 DIAGNOSIS — E785 Hyperlipidemia, unspecified: Secondary | ICD-10-CM | POA: Diagnosis not present

## 2016-01-26 DIAGNOSIS — G4700 Insomnia, unspecified: Secondary | ICD-10-CM | POA: Diagnosis not present

## 2016-01-26 DIAGNOSIS — I10 Essential (primary) hypertension: Secondary | ICD-10-CM | POA: Insufficient documentation

## 2016-01-26 DIAGNOSIS — E119 Type 2 diabetes mellitus without complications: Secondary | ICD-10-CM | POA: Diagnosis present

## 2016-01-26 LAB — GLUCOSE, POCT (MANUAL RESULT ENTRY): POC GLUCOSE: 168 mg/dL — AB (ref 70–99)

## 2016-01-26 LAB — POCT GLYCOSYLATED HEMOGLOBIN (HGB A1C): HEMOGLOBIN A1C: 6.9

## 2016-01-26 MED ORDER — CLOPIDOGREL BISULFATE 75 MG PO TABS: 75.0000 mg | ORAL_TABLET | Freq: Every day | ORAL | 3 refills | Status: DC

## 2016-01-26 MED ORDER — LIRAGLUTIDE 18 MG/3ML ~~LOC~~ SOPN: 1.2000 mg | PEN_INJECTOR | Freq: Every day | SUBCUTANEOUS | 3 refills | Status: AC

## 2016-01-26 MED ORDER — VITAMIN D (ERGOCALCIFEROL) 1.25 MG (50000 UNIT) PO CAPS: 50000.0000 [IU] | ORAL_CAPSULE | ORAL | 3 refills | Status: AC

## 2016-01-26 MED ORDER — LISINOPRIL 2.5 MG PO TABS
2.5000 mg | ORAL_TABLET | Freq: Every day | ORAL | 3 refills | Status: AC
Start: 2016-01-26 — End: ?

## 2016-01-26 MED ORDER — AMMONIUM LACTATE 12 % EX CREA: TOPICAL_CREAM | CUTANEOUS | 0 refills | Status: AC | PRN

## 2016-01-26 MED ORDER — PREGABALIN 150 MG PO CAPS: 150.0000 mg | ORAL_CAPSULE | Freq: Two times a day (BID) | ORAL | 3 refills | Status: AC

## 2016-01-26 MED ORDER — INSULIN GLARGINE 100 UNIT/ML SOLOSTAR PEN: 30.0000 [IU] | PEN_INJECTOR | Freq: Every morning | SUBCUTANEOUS | 3 refills | Status: AC

## 2016-01-26 MED ORDER — HYDROXYZINE HCL 25 MG PO TABS
25.0000 mg | ORAL_TABLET | Freq: Three times a day (TID) | ORAL | 0 refills | Status: AC | PRN
Start: 2016-01-26 — End: ?

## 2016-01-26 MED ORDER — INSULIN ASPART 100 UNIT/ML FLEXPEN: 12.0000 [IU] | PEN_INJECTOR | Freq: Three times a day (TID) | SUBCUTANEOUS | 2 refills | Status: AC

## 2016-01-26 MED ORDER — ASPIRIN 81 MG PO TABS
81.0000 mg | ORAL_TABLET | Freq: Every day | ORAL | 3 refills | Status: AC
Start: 2016-01-26 — End: ?

## 2016-01-26 MED ORDER — CANAGLIFLOZIN 100 MG PO TABS: 100.0000 mg | ORAL_TABLET | Freq: Every day | ORAL | 1 refills | Status: DC

## 2016-01-26 MED ORDER — METFORMIN HCL 1000 MG PO TABS: 1000.0000 mg | ORAL_TABLET | Freq: Two times a day (BID) | ORAL | 3 refills | Status: AC

## 2016-01-26 MED ORDER — CARVEDILOL 3.125 MG PO TABS: 3.1250 mg | ORAL_TABLET | Freq: Two times a day (BID) | ORAL | 3 refills | Status: AC

## 2016-01-26 MED ORDER — ATORVASTATIN CALCIUM 20 MG PO TABS: 20.0000 mg | ORAL_TABLET | Freq: Every day | ORAL | 3 refills | Status: DC

## 2016-01-26 MED ORDER — POLYETHYLENE GLYCOL 3350 17 GM/SCOOP PO POWD
17.0000 g | Freq: Every day | ORAL | 1 refills | Status: AC
Start: 2016-01-26 — End: ?

## 2016-01-26 MED ORDER — PREGABALIN 50 MG PO CAPS: ORAL_CAPSULE | ORAL | 0 refills | Status: DC

## 2016-01-26 MED ORDER — DULOXETINE HCL 30 MG PO CPEP: 30.0000 mg | ORAL_CAPSULE | Freq: Every day | ORAL | 3 refills | Status: DC

## 2016-01-26 NOTE — Patient Instructions (Signed)
Kathleen Holt was seen today for diabetes.  Diagnoses and all orders for this visit:  DM (diabetes mellitus), type 2 with neurological complications ([place]) -     POCT glucose (manual entry) -     HgB A1c -     DULoxetine (CYMBALTA) 30 MG capsule; Take 1 capsule (30 mg total) by mouth daily. With food -     atorvastatin (LIPITOR) 20 MG tablet; Take 1 tablet (20 mg total) by mouth daily. -     liraglutide (VICTOZA) 18 MG/3ML SOPN; Inject 0.2 mLs (1.2 mg total) into the skin at bedtime. -     metFORMIN (GLUCOPHAGE) 1000 MG tablet; Take 1 tablet (1,000 mg total) by mouth 2 (two) times daily with a meal. -     Insulin Glargine (LANTUS SOLOSTAR) 100 UNIT/ML Solostar Pen; Inject 30 Units into the skin every morning. -     insulin aspart (NOVOLOG FLEXPEN) 100 UNIT/ML FlexPen; Inject 12 Units into the skin 3 (three) times daily with meals. -     canagliflozin (INVOKANA) 100 MG TABS tablet; Take 1 tablet (100 mg total) by mouth daily before breakfast. -     Ambulatory referral to Podiatry -     pregabalin (LYRICA) 50 MG capsule; Take by mouth 50 mg twice daily for two weeks, then 100 mg twice daily for two weeks, then 150 mg twice daily -     pregabalin (LYRICA) 150 MG capsule; Take 1 capsule (150 mg total) by mouth 2 (two) times daily.  Essential hypertension, benign -     lisinopril (PRINIVIL,ZESTRIL) 2.5 MG tablet; Take 1 tablet (2.5 mg total) by mouth daily. -     carvedilol (COREG) 3.125 MG tablet; Take 1 tablet (3.125 mg total) by mouth 2 (two) times daily with a meal. -     aspirin 81 MG tablet; Take 1 tablet (81 mg total) by mouth daily.  Coronary artery disease involving native coronary artery of native heart with angina pectoris (HCC) -     clopidogrel (PLAVIX) 75 MG tablet; Take 1 tablet (75 mg total) by mouth daily. -     carvedilol (COREG) 3.125 MG tablet; Take 1 tablet (3.125 mg total) by mouth 2 (two) times daily with a meal. -     aspirin 81 MG tablet; Take 1 tablet (81 mg total) by mouth  daily.  Dyslipidemia, goal LDL below 100 -     aspirin 81 MG tablet; Take 1 tablet (81 mg total) by mouth daily.  Vitamin D deficiency -     Vitamin D, Ergocalciferol, (DRISDOL) 50000 units CAPS capsule; Take 1 capsule (50,000 Units total) by mouth every 30 (thirty) days.  Constipation, unspecified constipation type -     polyethylene glycol powder (GLYCOLAX/MIRALAX) powder; Take 17 g by mouth daily.  Xerosis of skin -     hydrOXYzine (ATARAX/VISTARIL) 25 MG tablet; Take 1 tablet (25 mg total) by mouth 3 (three) times daily as needed. -     ammonium lactate (LAC-HYDRIN) 12 % cream; Apply topically as needed for dry skin.   Stop gabapentin  Replace gabapentin with lyrica   Taper down on gabapentin from 800 mg three times a day to 400 mg three times a day for one week, then 400 mg twice daily for one week, then 400 mg once daily for one week the STOP.   F/u in 6 weeks for diabetic nerve plan/ f/u lyrica treatment   Dr. Adrian Blackwater

## 2016-01-26 NOTE — Progress Notes (Signed)
Subjective:  Patient ID: Kathleen Holt, female    DOB: 1955-03-21  Age: 61 y.o. MRN: [id number]  CC: Diabetes   HPI Kathleen Holt presents for   1. CHRONIC DIABETES  Disease Monitoring  Blood Sugar Ranges:   Polyuria: no   Visual problems: no. She has yearly eye doctor appointment on 02/21/2015.   Medication Compliance: yes  Medication Side Effects  Hypoglycemia: no   Preventitive Health Care  Eye Exam:   Foot Exam: done today   Diet pattern: low carb  Exercise: walks 10-15 mins per day    2. Skin itching: has dry itchy skin. She scratches and get small scars. No rash. No lotions or OTC creams.   3. Insomnia: she has trouble sleeping due to itching. She also has constipation.   Social History  Substance Use Topics  . Smoking status: Never Smoker  . Smokeless tobacco: Never Used  . Alcohol use No    Outpatient Medications Prior to Visit  Medication Sig Dispense Refill  . ACCU-CHEK SOFTCLIX LANCETS lancets Use as instructed to check blood sugar 2 times per day dx code E11.40 100 each 3  . aspirin 81 MG tablet Take 1 tablet (81 mg total) by mouth daily. 30 tablet 11  . atorvastatin (LIPITOR) 20 MG tablet Take 1 tablet (20 mg total) by mouth daily. 90 tablet 1  . Blood Glucose Monitoring Suppl (ACCU-CHEK AVIVA PLUS) w/Device KIT Use to check blood sugar 2 times per day dx code E11.40 1 kit 0  . canagliflozin (INVOKANA) 100 MG TABS tablet Take 1 tablet (100 mg total) by mouth daily before breakfast. 90 tablet 1  . carvedilol (COREG) 3.125 MG tablet Take 1 tablet (3.125 mg total) by mouth 2 (two) times daily with a meal. 180 tablet 1  . clopidogrel (PLAVIX) 75 MG tablet Take 1 tablet (75 mg total) by mouth daily. 90 tablet 3  . DULoxetine (CYMBALTA) 30 MG capsule Take 1 capsule (30 mg total) by mouth daily. With food 30 capsule 2  . ferrous sulfate 325 (65 FE) MG tablet Take 1 tablet (325 mg total) by mouth 2 (two) times daily with a meal. 60 tablet 3  . gabapentin  (NEURONTIN) 400 MG capsule Take 2 capsules (800 mg total) by mouth 3 (three) times daily. 360 capsule 1  . glucose blood (ACCU-CHEK AVIVA PLUS) test strip Use as instructed to check blood sugar 2 times per day dx code E11.40 100 each 3  . insulin aspart (NOVOLOG FLEXPEN) 100 UNIT/ML FlexPen Inject 12 Units into the skin 3 (three) times daily with meals. 10 pen 2  . Insulin Glargine (LANTUS SOLOSTAR) 100 UNIT/ML Solostar Pen Inject 30 Units into the skin every morning. 10 pen 1  . Insulin Pen Needle 31G X 5 MM MISC Use as directed for insulin injection. 200 each 5  . Liraglutide (VICTOZA) 18 MG/3ML SOPN Inject 0.2 mLs (1.2 mg total) into the skin at bedtime. 6 pen 1  . lisinopril (PRINIVIL,ZESTRIL) 2.5 MG tablet Take 1 tablet (2.5 mg total) by mouth daily. 90 tablet 1  . metFORMIN (GLUCOPHAGE) 1000 MG tablet Take 1 tablet (1,000 mg total) by mouth 2 (two) times daily with a meal. 180 tablet 1  . nitroGLYCERIN (NITROSTAT) 0.4 MG SL tablet Place 1 tablet (0.4 mg total) under the tongue every 5 (five) minutes x 3 doses as needed for chest pain. 25 tablet 3  . Polyethyl Glycol-Propyl Glycol 0.4-0.3 % SOLN Place 1 drop into both eyes every 3 (three)  hours as needed (FOR DRYNESS).     . Vitamin D, Ergocalciferol, (DRISDOL) 50000 units CAPS capsule Take 1 capsule (50,000 Units total) by mouth every 30 (thirty) days. 4 capsule 11   No facility-administered medications prior to visit.     ROS Review of Systems  Constitutional: Negative for chills and fever.  Eyes: Negative for visual disturbance.  Respiratory: Negative for shortness of breath.   Cardiovascular: Negative for chest pain.  Gastrointestinal: Negative for abdominal pain and blood in stool.  Musculoskeletal: Positive for arthralgias (hands and feet ). Negative for back pain.  Skin: Negative for rash.       Itching all over body   Allergic/Immunologic: Negative for immunocompromised state.  Neurological: Positive for weakness (in LE ) and  numbness (in LE ).  Hematological: Negative for adenopathy. Does not bruise/bleed easily.  Psychiatric/Behavioral: Positive for suicidal ideas. Negative for dysphoric mood.    Objective:  BP 127/80 (BP Location: Left Arm, Patient Position: Sitting, Cuff Size: Small)   Pulse 90   Temp 97.4 F (36.3 C) (Oral)   Ht 5' (1.524 m)   Wt 187 lb 3.2 oz (84.9 kg)   SpO2 97%   BMI 36.56 kg/m   BP/Weight 01/26/2016 11/07/2015 9/41/7408  Systolic BP 144 818 563  Diastolic BP 80 60 80  Wt. (Lbs) 187.2 185.2 188.8  BMI 36.56 36.17 36.87   Physical Exam  Constitutional: She is oriented to person, place, and time. She appears well-developed and well-nourished. No distress.  HENT:  Head: Normocephalic and atraumatic.  Cardiovascular: Normal rate, regular rhythm, normal heart sounds and intact distal pulses.   Pulmonary/Chest: Effort normal and breath sounds normal.  Musculoskeletal: She exhibits no edema.  Neurological: She is alert and oriented to person, place, and time.  Skin: Skin is warm and dry. No rash noted.  Psychiatric: She has a normal mood and affect.   Lab Results  Component Value Date   HGBA1C 6.9 01/26/2016   CBG 168   Assessment & Plan:   Kathleen Holt was seen today for diabetes.  Diagnoses and all orders for this visit:  DM (diabetes mellitus), type 2 with neurological complications ([place]) -     POCT glucose (manual entry) -     HgB A1c -     DULoxetine (CYMBALTA) 30 MG capsule; Take 1 capsule (30 mg total) by mouth daily. With food -     atorvastatin (LIPITOR) 20 MG tablet; Take 1 tablet (20 mg total) by mouth daily. -     liraglutide (VICTOZA) 18 MG/3ML SOPN; Inject 0.2 mLs (1.2 mg total) into the skin at bedtime. -     metFORMIN (GLUCOPHAGE) 1000 MG tablet; Take 1 tablet (1,000 mg total) by mouth 2 (two) times daily with a meal. -     Insulin Glargine (LANTUS SOLOSTAR) 100 UNIT/ML Solostar Pen; Inject 30 Units into the skin every morning. -     insulin aspart (NOVOLOG  FLEXPEN) 100 UNIT/ML FlexPen; Inject 12 Units into the skin 3 (three) times daily with meals. -     canagliflozin (INVOKANA) 100 MG TABS tablet; Take 1 tablet (100 mg total) by mouth daily before breakfast. -     Ambulatory referral to Podiatry -     pregabalin (LYRICA) 50 MG capsule; Take by mouth 50 mg twice daily for two weeks, then 100 mg twice daily for two weeks, then 150 mg twice daily -     pregabalin (LYRICA) 150 MG capsule; Take 1 capsule (150 mg total) by  mouth 2 (two) times daily.  Essential hypertension, benign -     lisinopril (PRINIVIL,ZESTRIL) 2.5 MG tablet; Take 1 tablet (2.5 mg total) by mouth daily. -     carvedilol (COREG) 3.125 MG tablet; Take 1 tablet (3.125 mg total) by mouth 2 (two) times daily with a meal. -     aspirin 81 MG tablet; Take 1 tablet (81 mg total) by mouth daily.  Coronary artery disease involving native coronary artery of native heart with angina pectoris (HCC) -     clopidogrel (PLAVIX) 75 MG tablet; Take 1 tablet (75 mg total) by mouth daily. -     carvedilol (COREG) 3.125 MG tablet; Take 1 tablet (3.125 mg total) by mouth 2 (two) times daily with a meal. -     aspirin 81 MG tablet; Take 1 tablet (81 mg total) by mouth daily.  Dyslipidemia, goal LDL below 100 -     aspirin 81 MG tablet; Take 1 tablet (81 mg total) by mouth daily.  Vitamin D deficiency -     Vitamin D, Ergocalciferol, (DRISDOL) 50000 units CAPS capsule; Take 1 capsule (50,000 Units total) by mouth every 30 (thirty) days.  Constipation, unspecified constipation type -     polyethylene glycol powder (GLYCOLAX/MIRALAX) powder; Take 17 g by mouth daily.  Xerosis of skin -     hydrOXYzine (ATARAX/VISTARIL) 25 MG tablet; Take 1 tablet (25 mg total) by mouth 3 (three) times daily as needed. -     ammonium lactate (LAC-HYDRIN) 12 % cream; Apply topically as needed for dry skin.  Other orders -     Pneumococcal polysaccharide vaccine 23-valent greater than or equal to 2yo  subcutaneous/IM   No orders of the defined types were placed in this encounter.   Follow-up: Return in about 6 weeks (around 03/08/2016) for neuropathy .   Boykin Nearing MD

## 2016-01-26 NOTE — Progress Notes (Signed)
Pt is here today to follow up on diabetes. Pt is having pain in hands and feet, pt is itching all over her body. Pt also states she is getting no sleep.

## 2016-01-27 ENCOUNTER — Other Ambulatory Visit: Payer: Self-pay | Admitting: Family Medicine

## 2016-01-27 DIAGNOSIS — E1149 Type 2 diabetes mellitus with other diabetic neurological complication: Secondary | ICD-10-CM

## 2016-01-28 NOTE — Assessment & Plan Note (Signed)
Well controlled diabetes with neuropathy  Taper off gabapentin Replace with lyrica Podiatry referral for diabetic shoes

## 2016-01-30 ENCOUNTER — Telehealth: Payer: Self-pay | Admitting: Family Medicine

## 2016-01-30 DIAGNOSIS — E1149 Type 2 diabetes mellitus with other diabetic neurological complication: Secondary | ICD-10-CM

## 2016-01-30 MED ORDER — PREGABALIN 50 MG PO CAPS: ORAL_CAPSULE | ORAL | 0 refills | Status: DC

## 2016-01-30 NOTE — Telephone Encounter (Signed)
Called patient's Kathleen Holt Phone in clarification for lyrica

## 2016-02-01 ENCOUNTER — Telehealth: Payer: Self-pay | Admitting: Family Medicine

## 2016-02-01 DIAGNOSIS — E1149 Type 2 diabetes mellitus with other diabetic neurological complication: Secondary | ICD-10-CM

## 2016-02-01 MED ORDER — PREGABALIN 50 MG PO CAPS: ORAL_CAPSULE | ORAL | 0 refills | Status: AC

## 2016-02-01 MED ORDER — DAPAGLIFLOZIN PROPANEDIOL 5 MG PO TABS: 5.0000 mg | ORAL_TABLET | Freq: Every day | ORAL | 3 refills | Status: AC

## 2016-02-01 NOTE — Telephone Encounter (Signed)
Please inform patient that invokana was changed to farxiga due to change in drug coverage with [place] medicaid  They are of the same class She was on low dose invokana (100 mg) and will be switched to low dose farxiga ( 5 mg)

## 2016-02-02 ENCOUNTER — Other Ambulatory Visit (INDEPENDENT_AMBULATORY_CARE_PROVIDER_SITE_OTHER): Payer: Medicaid Other

## 2016-02-02 DIAGNOSIS — Z794 Long term (current) use of insulin: Secondary | ICD-10-CM | POA: Diagnosis not present

## 2016-02-02 DIAGNOSIS — E1165 Type 2 diabetes mellitus with hyperglycemia: Secondary | ICD-10-CM

## 2016-02-02 LAB — COMPREHENSIVE METABOLIC PANEL
ALBUMIN: 3.9 g/dL (ref 3.5–5.2)
ALK PHOS: 68 U/L (ref 39–117)
ALT: 15 U/L (ref 0–35)
AST: 16 U/L (ref 0–37)
BUN: 18 mg/dL (ref 6–23)
CHLORIDE: 103 meq/L (ref 96–112)
CO2: 30 mEq/L (ref 19–32)
CREATININE: 0.98 mg/dL (ref 0.40–1.20)
Calcium: 9.2 mg/dL (ref 8.4–10.5)
GFR: 60.97 mL/min (ref >60.00)
GLUCOSE: 111 mg/dL — AB (ref 70–99)
Potassium: 5.1 mEq/L (ref 3.5–5.1)
SODIUM: 137 meq/L (ref 135–145)
TOTAL PROTEIN: 7 g/dL (ref 6.0–8.3)
Total Bilirubin: 0.3 mg/dL (ref 0.2–1.2)

## 2016-02-02 LAB — HEMOGLOBIN A1C: Hgb A1c MFr Bld: 7.1 % — ABNORMAL HIGH (ref 4.6–6.5)

## 2016-02-06 ENCOUNTER — Ambulatory Visit (INDEPENDENT_AMBULATORY_CARE_PROVIDER_SITE_OTHER): Payer: Medicaid Other | Admitting: Cardiology

## 2016-02-06 ENCOUNTER — Encounter: Payer: Self-pay | Admitting: Cardiology

## 2016-02-06 VITALS — BP 130/76 | HR 84 | Ht 60.0 in | Wt 190.0 lb

## 2016-02-06 DIAGNOSIS — E785 Hyperlipidemia, unspecified: Secondary | ICD-10-CM | POA: Diagnosis not present

## 2016-02-06 DIAGNOSIS — I25119 Atherosclerotic heart disease of native coronary artery with unspecified angina pectoris: Secondary | ICD-10-CM

## 2016-02-06 DIAGNOSIS — R079 Chest pain, unspecified: Secondary | ICD-10-CM | POA: Diagnosis not present

## 2016-02-06 DIAGNOSIS — E1149 Type 2 diabetes mellitus with other diabetic neurological complication: Secondary | ICD-10-CM | POA: Diagnosis not present

## 2016-02-06 MED ORDER — CLOPIDOGREL BISULFATE 75 MG PO TABS: 75.0000 mg | ORAL_TABLET | Freq: Every day | ORAL | 3 refills | Status: AC

## 2016-02-06 MED ORDER — ATORVASTATIN CALCIUM 20 MG PO TABS: 20.0000 mg | ORAL_TABLET | Freq: Every day | ORAL | 3 refills | Status: AC

## 2016-02-06 MED ORDER — NITROGLYCERIN 0.4 MG SL SUBL: 0.4000 mg | SUBLINGUAL_TABLET | SUBLINGUAL | 3 refills | Status: AC | PRN

## 2016-02-06 NOTE — Progress Notes (Signed)
02/06/2016 Kathleen Holt   1955-02-14  [id number]  Primary Physician Minerva Ends, MD Primary Cardiologist: Radford Pax    Reason for Visit/CC: F/u for CAD  HPI:  Kathleen Holt is a 61 y.o. female with history of CAD (prior stenting x3 in [place] 2011, DEsx3 to RCA 12/2014, elective CTO of Cx 01/2015), DM, neuropathy, HLD, ischemic cardiomyopathy (EF 45-50%) who presents for f/u. She is followed by Dr. Radford Pax.   She presents to clinic today for routine f/u. She is planning on going to [place] for 3 months. She will be leaving in February. She is here with her husband. She reports that she has done well. No Chest pain or exertional dyspnea. She reports full medication compliance. She has not required use of SL NTG in over 6 months. BP is well controlled at 130/76. HR is 84. Her PCP, Dr. Dwyane Dee, recently checked basic labs. CMP showed normal renal function, hepatic function and electrolytes. Hgb A1c was at goal at 6.9. Lipids were not checked. Her last lipid panel was 06/2015. LDL was 58 mg/dL at that time. Unfortunately, she did eat breakfast this am but is willing to come back before she leaves for her trip for a fasting lipid panel. Her EKG today shows NSR w/o ischemic abnormalities.    Current Meds  Medication Sig  . ACCU-CHEK SOFTCLIX LANCETS lancets Use as instructed to check blood sugar 2 times per day dx code E11.40  . ammonium lactate (LAC-HYDRIN) 12 % cream Apply topically as needed for dry skin.  Marland Kitchen aspirin 81 MG tablet Take 1 tablet (81 mg total) by mouth daily.  Marland Kitchen atorvastatin (LIPITOR) 20 MG tablet Take 1 tablet (20 mg total) by mouth daily.  . Blood Glucose Monitoring Suppl (ACCU-CHEK AVIVA PLUS) w/Device KIT Use to check blood sugar 2 times per day dx code E11.40  . carvedilol (COREG) 3.125 MG tablet Take 1 tablet (3.125 mg total) by mouth 2 (two) times daily with a meal.  . clopidogrel (PLAVIX) 75 MG tablet Take 1 tablet (75 mg total) by mouth daily.  . dapagliflozin  propanediol (FARXIGA) 5 MG TABS tablet Take 5 mg by mouth daily.  . DULoxetine (CYMBALTA) 30 MG capsule Take 1 capsule (30 mg total) by mouth daily. With food  . ferrous sulfate 325 (65 FE) MG tablet Take 1 tablet (325 mg total) by mouth 2 (two) times daily with a meal.  . glucose blood (ACCU-CHEK AVIVA PLUS) test strip Use as instructed to check blood sugar 2 times per day dx code E11.40  . hydrOXYzine (ATARAX/VISTARIL) 25 MG tablet Take 1 tablet (25 mg total) by mouth 3 (three) times daily as needed.  . insulin aspart (NOVOLOG FLEXPEN) 100 UNIT/ML FlexPen Inject 12 Units into the skin 3 (three) times daily with meals.  . Insulin Glargine (LANTUS SOLOSTAR) 100 UNIT/ML Solostar Pen Inject 30 Units into the skin every morning.  . Insulin Pen Needle 31G X 5 MM MISC Use as directed for insulin injection.  Marland Kitchen liraglutide (VICTOZA) 18 MG/3ML SOPN Inject 0.2 mLs (1.2 mg total) into the skin at bedtime.  Marland Kitchen lisinopril (PRINIVIL,ZESTRIL) 2.5 MG tablet Take 1 tablet (2.5 mg total) by mouth daily.  . metFORMIN (GLUCOPHAGE) 1000 MG tablet Take 1 tablet (1,000 mg total) by mouth 2 (two) times daily with a meal.  . nitroGLYCERIN (NITROSTAT) 0.4 MG SL tablet Place 1 tablet (0.4 mg total) under the tongue every 5 (five) minutes x 3 doses as needed for chest pain.  Vladimir Faster Glycol-Propyl  Glycol 0.4-0.3 % SOLN Place 1 drop into both eyes every 3 (three) hours as needed (FOR DRYNESS).   . polyethylene glycol powder (GLYCOLAX/MIRALAX) powder Take 17 g by mouth daily.  . pregabalin (LYRICA) 150 MG capsule Take 1 capsule (150 mg total) by mouth 2 (two) times daily.  . pregabalin (LYRICA) 50 MG capsule Take by mouth 50 mg twice daily for two weeks, then 100 mg twice daily for two weeks, then 150 mg twice daily  . Vitamin D, Ergocalciferol, (DRISDOL) 50000 units CAPS capsule Take 1 capsule (50,000 Units total) by mouth every 30 (thirty) days.   No Known Allergies Past Medical History:  Diagnosis Date  . Chronic  constipation   . Chronic lower back pain   . Coronary artery disease    a. s/p stenting 11/2009 [facility] - PCI of the RCA  and LCX. b. 12/26/2014: Dist RCA 90% stenosed and 100% stenosis of the Prox to Mid-Cx. with 3 DES stents placed to RCA with residual LAD disease of 40%.  4.  Repeat cath 01/2015 with CTO of 100% instent restenosis of the LCX.  . Diabetic neuropathy ([place])   . Dyslipidemia, goal LDL below 70 02/11/2012  . Hyperlipidemia   . Ischemic dilated cardiomyopathy ([place])    a. Echo 2/17:  EF 45-50%, inf-lat AK, Gr 1 DD  . Mitral regurgitation    Mild, echo, January, 2014  . Myocardial infarction 2011  . Type II diabetes mellitus (H[facility])    Family History  Problem Relation Age of Onset  . CAD Father     MI at age 66  . Diabetes Father   . Hypertension Father   . Cancer Father   . Heart attack Father   . CAD Brother   . Heart attack Brother   . Stroke Neg Hx    Past Surgical History:  Procedure Laterality Date  . CARDIAC CATHETERIZATION N/A 12/26/2014   Procedure: Left Heart Cath and Coronary Angiography;  Surgeon: Jettie Booze, MD;  Location: [place] [facility];  Service: Cardiovascular;  Laterality: N/A;  . CARDIAC CATHETERIZATION N/A 12/26/2014   Procedure: Coronary Stent Intervention;  Surgeon: Jettie Booze, MD;  Location: [place];  Service: Cardiovascular; 3 overlapping Synergy drug-eluting stents, 2.25 x 16, 2.5 x 38 and 3.0 x 32 to the RCA  . CARDIAC CATHETERIZATION N/A 02/08/2015   Procedure: Coronary/Bypass Graft CTO Intervention;  Surgeon: Jettie Booze, MD;  Location: [place] [facility];  Service: Cardiovascular;  Laterality: N/A;  . CARDIAC CATHETERIZATION  02/08/2015   Procedure: Left Heart Cath and Coronary Angiography;  Surgeon: Jettie Booze, MD;  Location: [place] [facility];  Service: Cardiovascular;;  . CATARACT EXTRACTION W/ INTRAOCULAR LENS  IMPLANT, BILATERAL    . CORONARY ANGIOPLASTY WITH  STENT PLACEMENT  2011; 12/26/2014  . EYE SURGERY    . VAGINAL HYSTERECTOMY     Social History   Social History  . Marital status: Married    Spouse name: N/A  . Number of children: 3  . Years of education: N/A   Occupational History  . Housewife    Social History Main Topics  . Smoking status: Never Smoker  . Smokeless tobacco: Never Used  . Alcohol use No  . Drug use: No  . Sexual activity: Yes    Birth control/ protection: Post-menopausal   Other Topics Concern  . Not on file   Social History Narrative  . No narrative on file  Review of Systems: General: negative for chills, fever, night sweats or weight changes.  Cardiovascular: negative for chest pain, dyspnea on exertion, edema, orthopnea, palpitations, paroxysmal nocturnal dyspnea or shortness of breath Dermatological: negative for rash Respiratory: negative for cough or wheezing Urologic: negative for hematuria Abdominal: negative for nausea, vomiting, diarrhea, bright red blood per rectum, melena, or hematemesis Neurologic: negative for visual changes, syncope, or dizziness All other systems reviewed and are otherwise negative except as noted above.   Physical Exam:  Blood pressure 130/76, pulse 84, height 5' (1.524 m), weight 190 lb (86.2 kg).  General appearance: alert, cooperative and no distress Neck: no carotid bruit and no JVD Lungs: clear to auscultation bilaterally Heart: regular rate and rhythm, S1, S2 normal, no murmur, click, rub or gallop Extremities: extremities normal, atraumatic, no cyanosis or edema Pulses: 2+ and symmetric Skin: Skin color, texture, turgor normal. No rashes or lesions Neurologic: Grossly normal  EKG NSR. No ischemic abnormalties.   ASSESSMENT AND PLAN:   1. CAD: prior stenting x3 in [place] 2011, DEsx3 to RCA 12/2014, elective CTO of Cx 01/2015. She is stable w/o anginal symptoms. Continue DAPT with ASA + Plavix, along with statin and BB.   2. Ischemic  Cardiomyopathy:  EF of 45-50%. Volume is stable. She denies dyspnea, orthopnea, PND and LEE. Continue medical therapy with metoprolol and lisinopril.   3. HTN: BP is well controlled on current regimen. PCP checked recent CMP. I have reviewed results from 01/26/16. Renal function and electrolytes are stable.   4. HLD: on statin. We will check a FLP this week. CMP from 01/26/16 reviewed. Hepatic function is normal.  5. DM: followed by PCP. Well controlled. Recent Hgb A1c was at goal at 6.9.   PLAN  F/u with Dr. Radford Pax in 6 months.   Evamarie Raetz PA-C 02/06/2016 9:30 AM

## 2016-02-06 NOTE — Patient Instructions (Signed)
Medication Instructions:   Your physician recommends that you continue on your current medications as directed. Please refer to the Current Medication list given to you today.   If you need a refill on your cardiac medications before your next appointment, please call your pharmacy.  Labwork: RETURN TOMORROW FOR FASTING LABS    Testing/Procedures: NONE ORDERED  TODAY    Follow-Up: Your physician wants you to follow-up in:  IN 6   MONTHS WITH DR  Radford Pax  You will receive a reminder letter in the mail two months in advance. If you don't receive a letter, please call our office to schedule the follow-up appointment.      Any Other Special Instructions Will Be Listed Below (If Applicable).

## 2016-02-07 ENCOUNTER — Other Ambulatory Visit: Payer: Medicaid Other

## 2016-02-07 ENCOUNTER — Ambulatory Visit: Payer: Medicaid Other | Admitting: Endocrinology

## 2016-02-14 ENCOUNTER — Ambulatory Visit (INDEPENDENT_AMBULATORY_CARE_PROVIDER_SITE_OTHER): Payer: Medicaid Other | Admitting: Podiatry

## 2016-02-14 ENCOUNTER — Encounter: Payer: Self-pay | Admitting: Endocrinology

## 2016-02-14 ENCOUNTER — Ambulatory Visit (INDEPENDENT_AMBULATORY_CARE_PROVIDER_SITE_OTHER): Payer: Medicaid Other | Admitting: Endocrinology

## 2016-02-14 ENCOUNTER — Other Ambulatory Visit: Payer: Medicaid Other | Admitting: *Deleted

## 2016-02-14 VITALS — BP 128/72 | HR 77 | Ht 60.0 in | Wt 187.0 lb

## 2016-02-14 VITALS — BP 138/79 | HR 72

## 2016-02-14 DIAGNOSIS — E1165 Type 2 diabetes mellitus with hyperglycemia: Secondary | ICD-10-CM | POA: Diagnosis not present

## 2016-02-14 DIAGNOSIS — E1149 Type 2 diabetes mellitus with other diabetic neurological complication: Secondary | ICD-10-CM | POA: Diagnosis not present

## 2016-02-14 DIAGNOSIS — Z794 Long term (current) use of insulin: Secondary | ICD-10-CM | POA: Diagnosis not present

## 2016-02-14 DIAGNOSIS — E119 Type 2 diabetes mellitus without complications: Secondary | ICD-10-CM | POA: Diagnosis not present

## 2016-02-14 DIAGNOSIS — E785 Hyperlipidemia, unspecified: Secondary | ICD-10-CM

## 2016-02-14 MED ORDER — DULOXETINE HCL 60 MG PO CPEP: 60.0000 mg | ORAL_CAPSULE | Freq: Every day | ORAL | 4 refills | Status: DC

## 2016-02-14 NOTE — Patient Instructions (Addendum)
Check blood sugars on waking up  3x weekly  Also check blood sugars about 2 hours after a meal and do this after different meals by rotation including lunch  Recommended blood sugar levels on waking up is 90-130 and about 2 hours after meal is 130-160  Please bring your blood sugar monitor to each visit, thank you  Take 14 NOVOLOG at lunch

## 2016-02-14 NOTE — Progress Notes (Signed)
   Subjective:  Patient presents today for routine diabetic foot exam. Patient states that she has a history of diabetes mellitus. Patient recently moved from [place] which time she wore diabetic shoes. She presents today for routine diabetic foot exam and authorization for diabetic shoes.    Objective/Physical Exam General: The patient is alert and oriented x3 in no acute distress.  Dermatology: Skin is warm, dry and supple bilateral lower extremities. Negative for open lesions or macerations.  Vascular: Palpable pedal pulses bilaterally. No edema or erythema noted. Capillary refill within normal limits.  Neurological: Epicritic and protective threshold grossly intact bilaterally.   Musculoskeletal Exam: Range of motion within normal limits to all pedal and ankle joints bilateral. Muscle strength 5/5 in all groups bilateral.   Radiographic Exam:  Normal osseous mineralization. Joint spaces preserved. No fracture/dislocation/boney destruction.    Assessment: #1 diabetes mellitus without complications   Plan of Care:  #1 Patient was evaluated. #2 routine diabetic lower extremity exam was performed today. #3 return to clinic when necessary #4 patient has healthy her extremities without pedal deformities and with intact neurological sensation. Patient likely does not qualify for diabetic shoes through insurance   Edrick Kins, DPM [facility]  Dr. Edrick Kins, [address]                                        [place], [place] [address]                Office [phone]  Fax [phone]

## 2016-02-14 NOTE — Progress Notes (Signed)
Patient ID: Kathleen Holt, female   DOB: 1955/10/27, 61 y.o.   MRN: [id number]    Reason for Appointment: Follow-up of type 2 Diabetes  History of Present Illness:          Diagnosis: Type 2 diabetes mellitus, date of diagnosis: 2005        Past history: The diabetes was diagnosed about 10 years ago and she was probably treated with metformin and other oral agents for at least 6 years. Previously she had tried metformin, Januvia and Amaryl When she was admitted for coronary disease in 2011 in [place] she was switched to insulin and oral agents. Not clear how her control has been in the past She has been on Lantus and NovoLog insulin since 2011 She also had tried Byetta with her insulin for sometime but not clear if it was helping her  Because of her poor control and persistently high A1c of 10.1% in 04/2013 her metformin was increased to maximum dose. Also she was started on Victoza but she was unable to tolerate the 1.2 mg dosage since she had decreased appetite and abdominal distress  Recent history:   INSULIN regimen is described as:  Lantus 30 at am, NovoLog 12-14 units ac with meals Non-insulin hypoglycemic drugs the patient is taking are: Metformin 2 g daily, Invokana 100 mg daily, Victoza 0.9 mg daily   Her A1c is slightly higher at 7.1, previously 6.9  Currently on basal bolus insulin regimen with Lantus insulin in the mornings She was also started on Invokana on her visit on 04/20/15  Current blood sugar patterns and problems identified:  She is checking blood sugars either in the morning or late evening around bedtime and not after meals again as directed  She does not check her blood sugar after lunch which is her main meal because she is not having her meter with her  Although she was told to cut back on her NovoLog at mealtimes her blood sugars after supper are somewhat variable.  She thinks she can take 2 more units when she plans to eat a larger meal  No  hypoglycemia  Still has difficulty losing weight despite taking Invokana and Victoza  FASTING readings are mostly near normal   She tries to be active with some walking but no programmed aerobic exercise       Side effects from medications have been: abdominal distress and anorexia with 1.2 Victoza  Glucose monitoring:  done once or twice daily        Glucometer: Accu-Chek  Blood Glucose readings from download:  Mean values apply above for all meters except median for One Touch  PRE-MEAL Fasting Lunch Dinner Bedtime Overall  Glucose range: 95-145    109-170    Mean/median: 112     1 20    1-2 AM 99-173   Glycemic control  Lab Results  Component Value Date   HGBA1C 7.1 (H) 02/02/2016   HGBA1C 6.9 01/26/2016   HGBA1C 6.9 (H) 10/31/2015   Lab Results  Component Value Date   MICROALBUR 1.0 10/31/2015   LDLCALC 58 06/22/2015   CREATININE 0.98 02/02/2016    Self-care: The diet that the patient has been following is: None, usually vegetarian, Small portions       Exercise: She is trying to walk more, generally 7-10k steps  Dietician visit:  none              Compliance with the medical regimen: Improving  Weight history:  Wt Readings from Last 3 Encounters:  02/14/16 187 lb (84.8 kg)  02/06/16 190 lb (86.2 kg)  01/26/16 187 lb 3.2 oz (84.9 kg)    OTHER active problems: See review of systems   No visits with results within 1 Week(s) from this visit.  Latest known visit with results is:  Lab on 02/02/2016  Component Date Value Ref Range Status  . Hgb A1c MFr Bld 02/02/2016 7.1* 4.6 - 6.5 % Final  . Sodium 02/02/2016 137  135 - 145 mEq/L Final  . Potassium 02/02/2016 5.1  3.5 - 5.1 mEq/L Final  . Chloride 02/02/2016 103  96 - 112 mEq/L Final  . CO2 02/02/2016 30  19 - 32 mEq/L Final  . Glucose, Bld 02/02/2016 111* 70 - 99 mg/dL Final  . BUN 02/02/2016 18  6 - 23 mg/dL Final  . Creatinine, Ser 02/02/2016 0.98  0.40 - 1.20 mg/dL Final  . Total Bilirubin  02/02/2016 0.3  0.2 - 1.2 mg/dL Final  . Alkaline Phosphatase 02/02/2016 68  39 - 117 U/L Final  . AST 02/02/2016 16  0 - 37 U/L Final  . ALT 02/02/2016 15  0 - 35 U/L Final  . Total Protein 02/02/2016 7.0  6.0 - 8.3 g/dL Final  . Albumin 02/02/2016 3.9  3.5 - 5.2 g/dL Final  . Calcium 02/02/2016 9.2  8.4 - 10.5 mg/dL Final  . GFR 02/02/2016 60.97  >60.00 mL/min Final    Allergies as of 02/14/2016   No Known Allergies     Medication List       Accurate as of 02/14/16  8:27 AM. Always use your most recent med list.          ACCU-CHEK AVIVA PLUS w/Device Kit Use to check blood sugar 2 times per day dx code E11.40   ACCU-CHEK SOFTCLIX LANCETS lancets Use as instructed to check blood sugar 2 times per day dx code E11.40   ammonium lactate 12 % cream Commonly known as:  LAC-HYDRIN Apply topically as needed for dry skin.   aspirin 81 MG tablet Take 1 tablet (81 mg total) by mouth daily.   atorvastatin 20 MG tablet Commonly known as:  LIPITOR Take 1 tablet (20 mg total) by mouth daily.   carvedilol 3.125 MG tablet Commonly known as:  COREG Take 1 tablet (3.125 mg total) by mouth 2 (two) times daily with a meal.   clopidogrel 75 MG tablet Commonly known as:  PLAVIX Take 1 tablet (75 mg total) by mouth daily.   dapagliflozin propanediol 5 MG Tabs tablet Commonly known as:  FARXIGA Take 5 mg by mouth daily.   DULoxetine 60 MG capsule Commonly known as:  CYMBALTA Take 1 capsule (60 mg total) by mouth daily. With food   ferrous sulfate 325 (65 FE) MG tablet Take 1 tablet (325 mg total) by mouth 2 (two) times daily with a meal.   glucose blood test strip Commonly known as:  ACCU-CHEK AVIVA PLUS Use as instructed to check blood sugar 2 times per day dx code E11.40   hydrOXYzine 25 MG tablet Commonly known as:  ATARAX/VISTARIL Take 1 tablet (25 mg total) by mouth 3 (three) times daily as needed.   insulin aspart 100 UNIT/ML FlexPen Commonly known as:  NOVOLOG  FLEXPEN Inject 12 Units into the skin 3 (three) times daily with meals.   Insulin Glargine 100 UNIT/ML Solostar Pen Commonly known as:  LANTUS SOLOSTAR Inject 30 Units into the skin every morning.   Insulin Pen  Needle 31G X 5 MM Misc Use as directed for insulin injection.   liraglutide 18 MG/3ML Sopn Commonly known as:  VICTOZA Inject 0.2 mLs (1.2 mg total) into the skin at bedtime.   lisinopril 2.5 MG tablet Commonly known as:  PRINIVIL,ZESTRIL Take 1 tablet (2.5 mg total) by mouth daily.   metFORMIN 1000 MG tablet Commonly known as:  GLUCOPHAGE Take 1 tablet (1,000 mg total) by mouth 2 (two) times daily with a meal.   nitroGLYCERIN 0.4 MG SL tablet Commonly known as:  NITROSTAT Place 1 tablet (0.4 mg total) under the tongue every 5 (five) minutes x 3 doses as needed for chest pain.   Polyethyl Glycol-Propyl Glycol 0.4-0.3 % Soln Place 1 drop into both eyes every 3 (three) hours as needed (FOR DRYNESS).   polyethylene glycol powder powder Commonly known as:  GLYCOLAX/MIRALAX Take 17 g by mouth daily.   pregabalin 150 MG capsule Commonly known as:  LYRICA Take 1 capsule (150 mg total) by mouth 2 (two) times daily.   pregabalin 50 MG capsule Commonly known as:  LYRICA Take by mouth 50 mg twice daily for two weeks, then 100 mg twice daily for two weeks, then 150 mg twice daily   Vitamin D (Ergocalciferol) 50000 units Caps capsule Commonly known as:  DRISDOL Take 1 capsule (50,000 Units total) by mouth every 30 (thirty) days.       Allergies: No Known Allergies  Past Medical History:  Diagnosis Date  . Chronic constipation   . Chronic lower back pain   . Coronary artery disease    a. s/p stenting 11/2009 [facility] - PCI of the RCA  and LCX. b. 12/26/2014: Dist RCA 90% stenosed and 100% stenosis of the Prox to Mid-Cx. with 3 DES stents placed to RCA with residual LAD disease of 40%.  4.  Repeat cath 01/2015 with CTO of 100% instent  restenosis of the LCX.  . Diabetic neuropathy ([place])   . Dyslipidemia, goal LDL below 70 02/11/2012  . Hyperlipidemia   . Ischemic dilated cardiomyopathy ([place])    a. Echo 2/17:  EF 45-50%, inf-lat AK, Gr 1 DD  . Mitral regurgitation    Mild, echo, January, 2014  . Myocardial infarction 2011  . Type II diabetes mellitus ([place])     Past Surgical History:  Procedure Laterality Date  . CARDIAC CATHETERIZATION N/A 12/26/2014   Procedure: Left Heart Cath and Coronary Angiography;  Surgeon: Jettie Booze, MD;  Location: [place] [facility];  Service: Cardiovascular;  Laterality: N/A;  . CARDIAC CATHETERIZATION N/A 12/26/2014   Procedure: Coronary Stent Intervention;  Surgeon: Jettie Booze, MD;  Location: [place] [facility];  Service: Cardiovascular; 3 overlapping Synergy drug-eluting stents, 2.25 x 16, 2.5 x 38 and 3.0 x 32 to the RCA  . CARDIAC CATHETERIZATION N/A 02/08/2015   Procedure: Coronary/Bypass Graft CTO Intervention;  Surgeon: Jettie Booze, MD;  Location: [place] [facility];  Service: Cardiovascular;  Laterality: N/A;  . CARDIAC CATHETERIZATION  02/08/2015   Procedure: Left Heart Cath and Coronary Angiography;  Surgeon: Jettie Booze, MD;  Location: [place] [facility];  Service: Cardiovascular;;  . CATARACT EXTRACTION W/ INTRAOCULAR LENS  IMPLANT, BILATERAL    . CORONARY ANGIOPLASTY WITH STENT PLACEMENT  2011; 12/26/2014  . EYE SURGERY    . VAGINAL HYSTERECTOMY      Family History  Problem Relation Age of Onset  . CAD Father     MI at age 75  .  Diabetes Father   . Hypertension Father   . Cancer Father   . Heart attack Father   . CAD Brother   . Heart attack Brother   . Stroke Neg Hx     Social History:  reports that she has never smoked. She has never used smokeless tobacco. She reports that she does not drink alcohol or use drugs.    Review of Systems   Eye exam: Last in  3/16  Her anemia is improved, this is related to B12 and iron  deficiency  She Had a normal B12 1 last measurement   Lab Results  Component Value Date   WBC 8.9 10/31/2015   HGB 12.0 10/31/2015   HCT 36.9 10/31/2015   MCV 71.4 (L) 10/31/2015   PLT 325.0 10/31/2015    HYPERTENSION: This is fairly minimal and is only on minimal doses of lisinopril and Coreg with good control      Lipids: Has mixed dyslipidemia. On Crestor previously but now is on Lipitor 20 mg from her PCP, does have known coronary artery disease      Lab Results  Component Value Date   CHOL 132 06/22/2015   HDL 42 (L) 06/22/2015   LDLCALC 58 06/22/2015   TRIG 161 (H) 06/22/2015   CHOLHDL 3.1 06/22/2015        She has a long-standing history of Numbness, tingling, Burning in her feet treated with Lyrica, previously on gabapentin  She does not think Gabapentin 800 mg 3 times a day has been helping her even though it was restarted in the last visit Also Lyrica does not work as well She thinks her symptoms are affecting her sleep recently  Has history of vitamin D deficiency  Physical Examination:  BP 128/72   Pulse 77   Ht 5' (1.524 m)   Wt 187 lb (84.8 kg)   SpO2 96%   BMI 36.52 kg/m       ASSESSMENT:  Diabetes type 2, uncontrolled with marked obesity and BMI  39 See history of present illness for detailed discussion of current management, blood sugar patterns and problems identified  Her A1c is 7.1 Since her home blood sugars averaging only 120 most likely she still has some postprandial hyperglycemia She is not checking her sugars 2 hours after meals especially lunch when she is eating a larger meal than evening Also has difficulty losing weight Tends to have higher postprandial reading because of having a relatively high carbohydrate and low protein diet with her vegetarian choices  NEUROPATHY: She is having persistent symptoms and not benefiting Completely from Cymbalta    PLAN:   No change in Lantus doses  Try to check blood sugars at various  times of the day, especially 2 hours after lunch and other meals and less in the morning  She will try 14 Novolog at lunch  Trial of 60 mg Cymbalta for neuropathy  Patient Instructions  Check blood sugars on waking up  3x weekly  Also check blood sugars about 2 hours after a meal and do this after different meals by rotation including lunch  Recommended blood sugar levels on waking up is 90-130 and about 2 hours after meal is 130-160  Please bring your blood sugar monitor to each visit, thank you  Take 14 NOVOLOG at lunch       [facility] 02/14/2016, 8:27 AM

## 2016-02-15 LAB — LIPID PANEL
Chol/HDL Ratio: 3.4 ratio units (ref 0.0–4.4)
Cholesterol, Total: 130 mg/dL (ref 100–199)
HDL: 38 mg/dL — AB (ref >39)
LDL Calculated: 68 mg/dL (ref 0–99)
Triglycerides: 121 mg/dL (ref 0–149)
VLDL Cholesterol Cal: 24 mg/dL (ref 5–40)

## 2016-02-23 ENCOUNTER — Other Ambulatory Visit: Payer: Self-pay | Admitting: Family Medicine

## 2016-02-23 DIAGNOSIS — E1149 Type 2 diabetes mellitus with other diabetic neurological complication: Secondary | ICD-10-CM

## 2016-05-24 ENCOUNTER — Other Ambulatory Visit: Payer: Self-pay | Admitting: Family Medicine

## 2016-06-05 ENCOUNTER — Encounter: Payer: Self-pay | Admitting: Family Medicine

## 2016-06-10 ENCOUNTER — Other Ambulatory Visit: Payer: Medicaid Other

## 2016-06-13 ENCOUNTER — Ambulatory Visit: Payer: Medicaid Other | Admitting: Endocrinology

## 2016-06-13 DIAGNOSIS — Z0289 Encounter for other administrative examinations: Secondary | ICD-10-CM

## 2016-08-20 ENCOUNTER — Other Ambulatory Visit: Payer: Self-pay | Admitting: Family Medicine

## 2016-08-23 ENCOUNTER — Other Ambulatory Visit: Payer: Self-pay | Admitting: Family Medicine

## 2016-10-05 ENCOUNTER — Other Ambulatory Visit: Payer: Self-pay | Admitting: Endocrinology

## 2016-10-05 DIAGNOSIS — E1149 Type 2 diabetes mellitus with other diabetic neurological complication: Secondary | ICD-10-CM

## 2016-10-07 ENCOUNTER — Telehealth: Payer: Self-pay | Admitting: Endocrinology

## 2016-10-07 NOTE — Telephone Encounter (Signed)
Kathleen Holt [address], [place] - [address] [phone] (Phone) [phone] (Fax)   Needs a 90 day supply for the DULoxetine (CYMBALTA) 60 MG capsule. Call to give the order.

## 2016-10-07 NOTE — Telephone Encounter (Signed)
Last o/v 02/14/16 2 no shows and no future scheduled- refill or refuse please advise

## 2016-10-07 NOTE — Telephone Encounter (Signed)
Prescription was sent on 9/16.  If she is not in [place] now we cannot continue to refill

## 2016-10-07 NOTE — Telephone Encounter (Signed)
Spoke to the patient and husband and they stated she lives in [place] now so I advised her that we can not refill the cymbalta- patient's husband stated an understanding

## 2017-07-04 ENCOUNTER — Other Ambulatory Visit: Payer: Self-pay | Admitting: Family Medicine

## 2017-07-04 DIAGNOSIS — E1149 Type 2 diabetes mellitus with other diabetic neurological complication: Secondary | ICD-10-CM

## 2017-08-29 IMAGING — MG MM SCREEN MAMMOGRAM BILATERAL
5 series · 5 of 5 positions shown · non-contrast
Comparison: Previous exam(s).

CLINICAL DATA: Screening.

EXAM:
DIGITAL SCREENING BILATERAL MAMMOGRAM WITH CAD

[R CC]
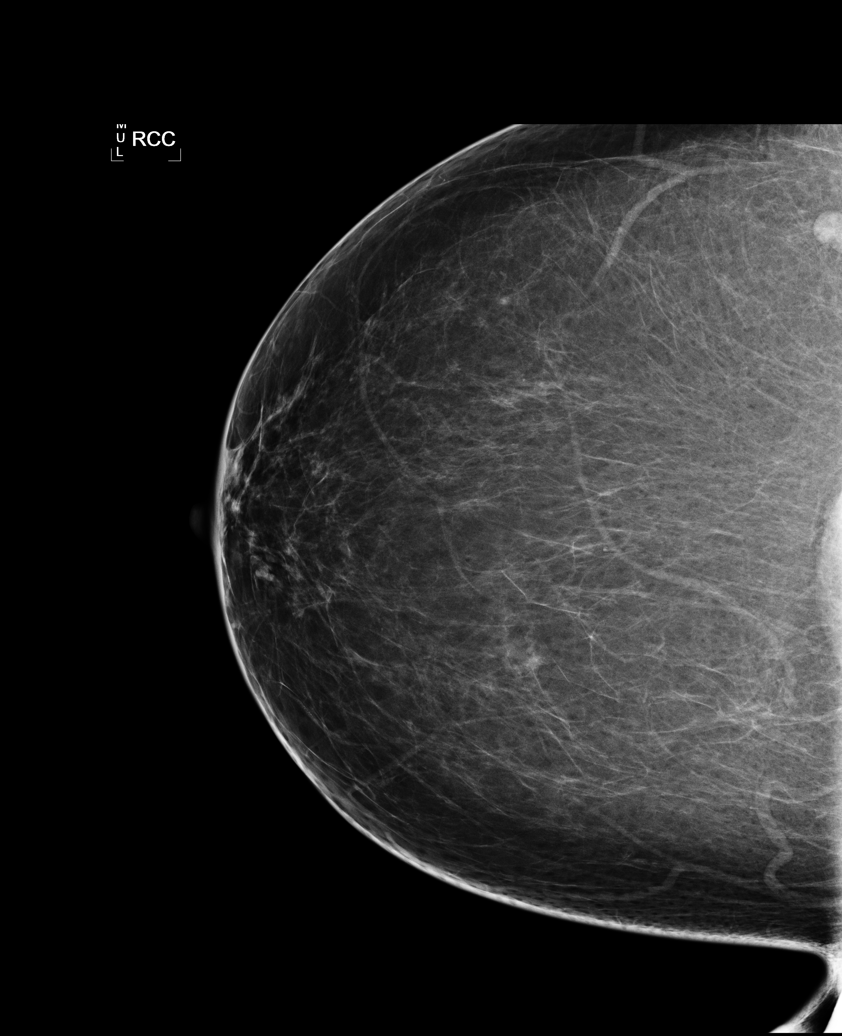

[L CC (1 of 2)]
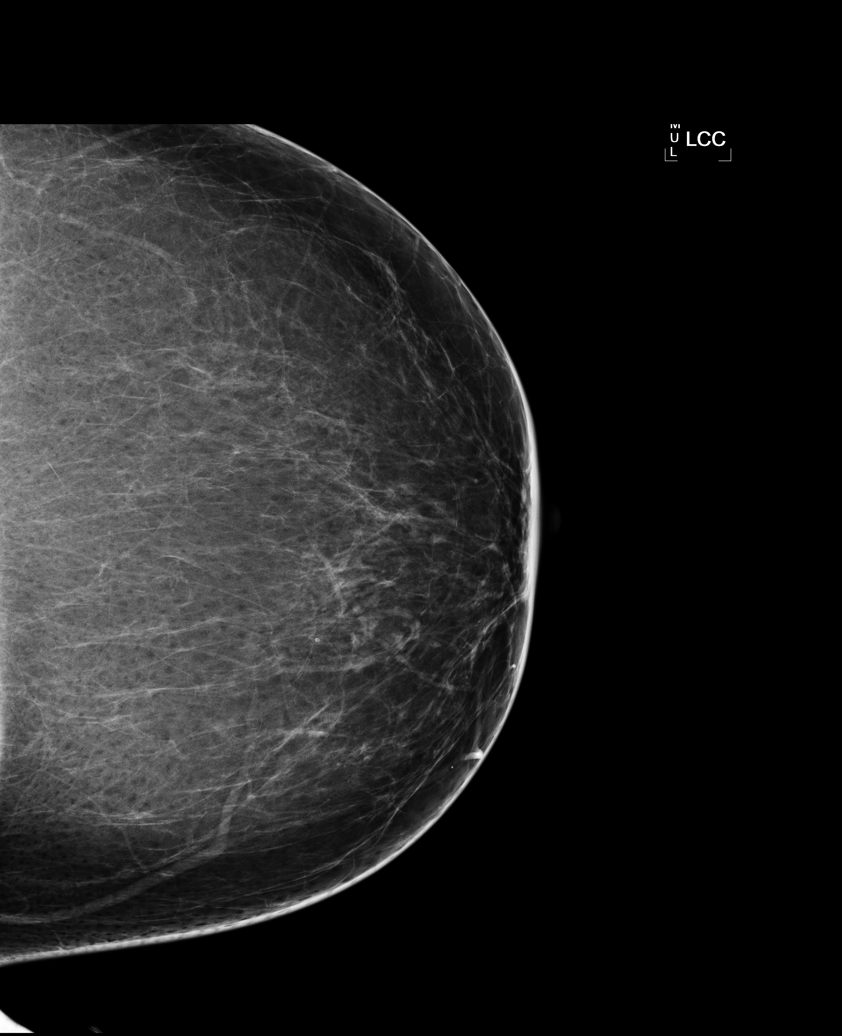

[L MLO]
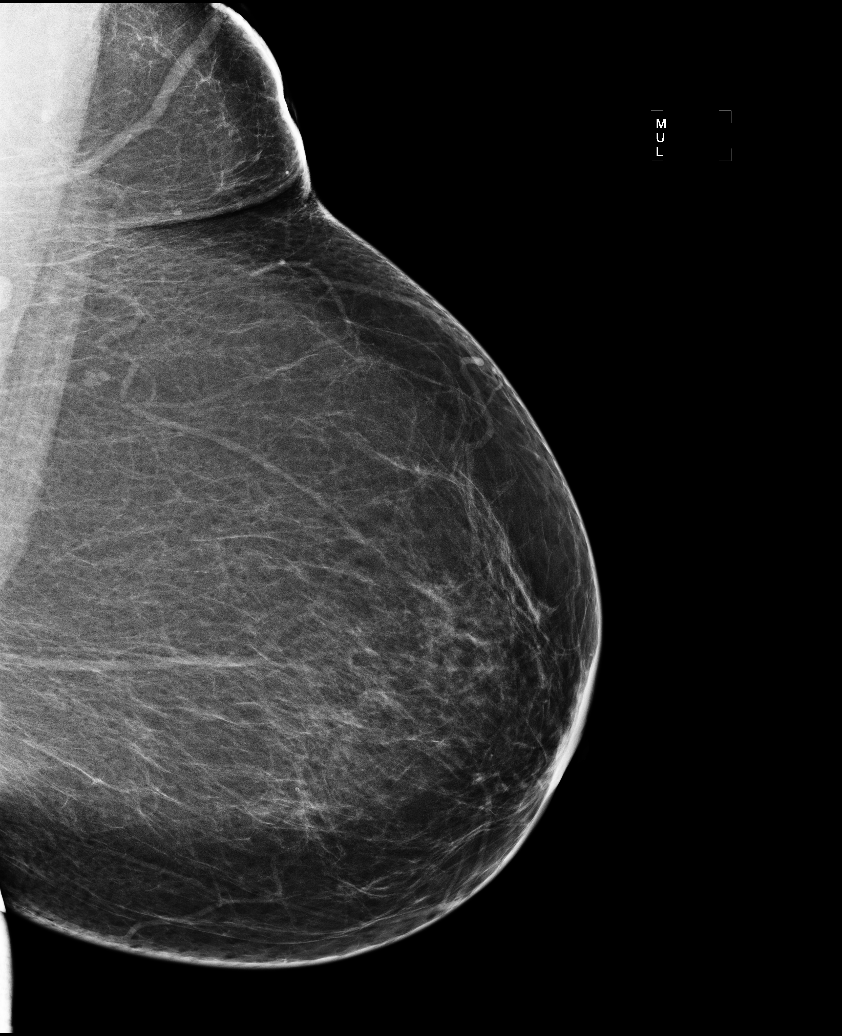

[R MLO]
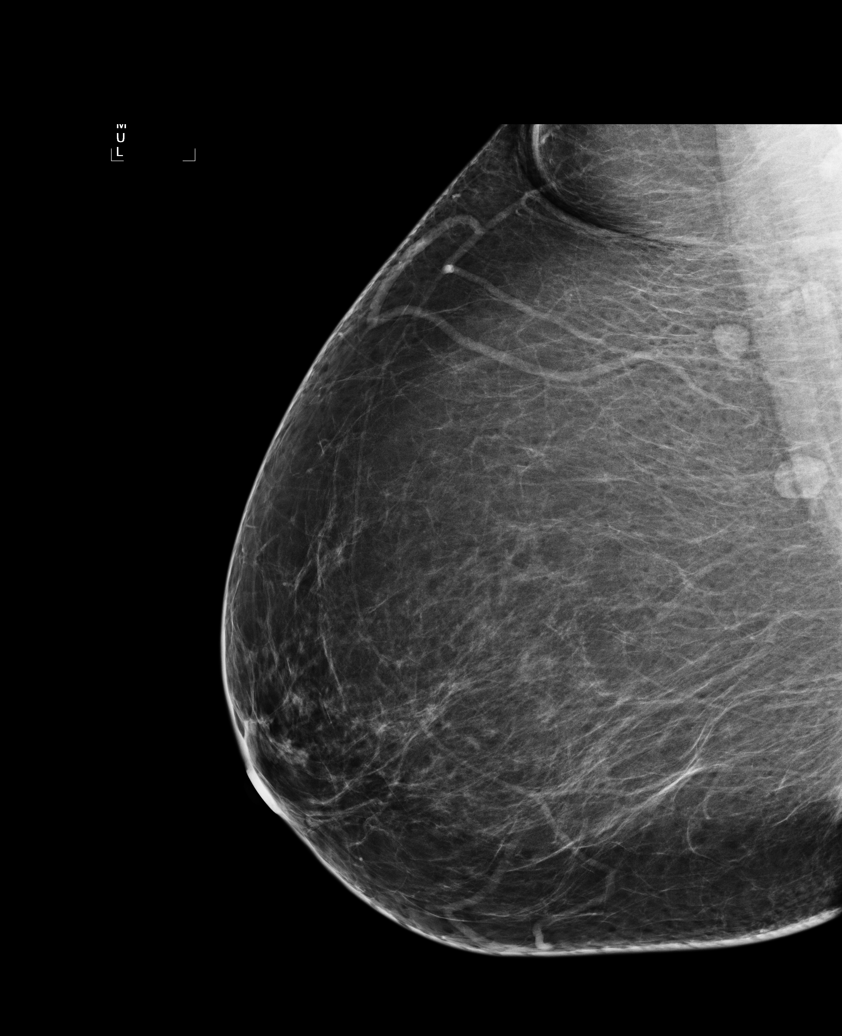

[L CC (2 of 2)]
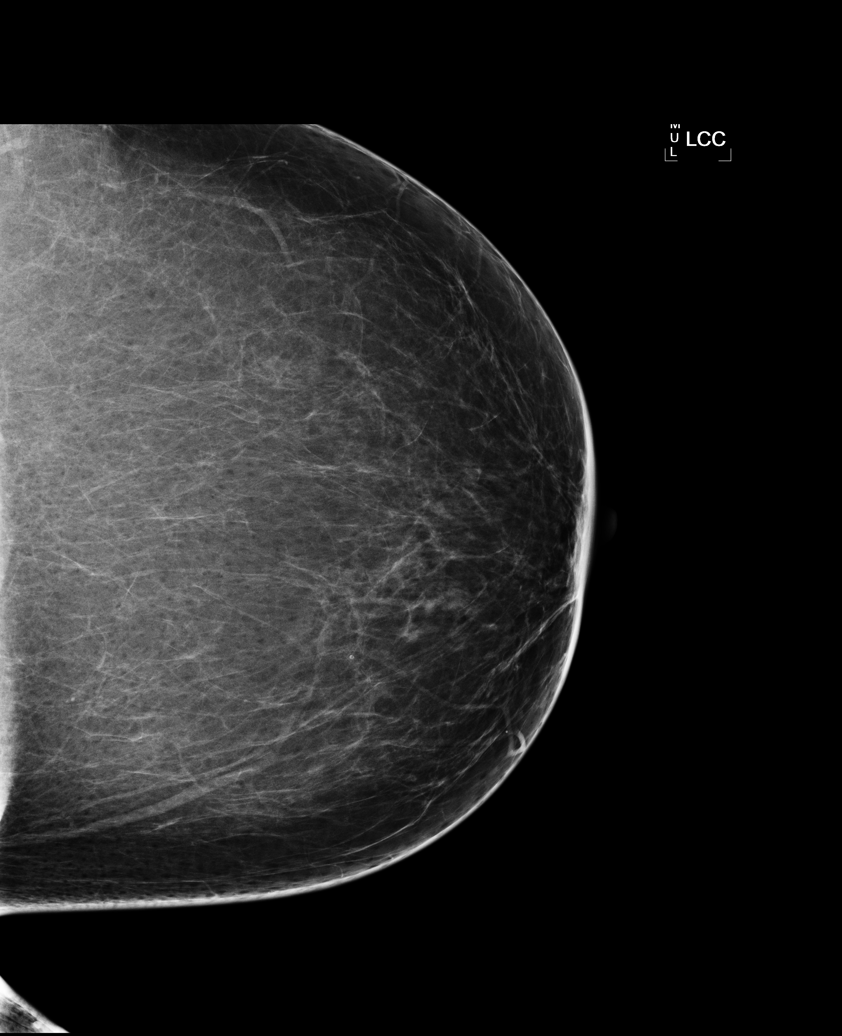

[5 of 5 positions shown; findings below may reference images not displayed]

ACR Breast Density Category b: There are scattered areas of
fibroglandular density.
FINDINGS: There are no findings suspicious for malignancy. Images were
processed with CAD.
IMPRESSION: No mammographic evidence of malignancy. A result letter of this
screening mammogram will be mailed directly to the patient.

RECOMMENDATION:
Screening mammogram in one year. (Code:AS-G-LCT)

BI-RADS CATEGORY  1: Negative.

## 2017-11-19 ENCOUNTER — Encounter: Payer: Self-pay | Admitting: Internal Medicine

## 2017-11-20 ENCOUNTER — Encounter: Payer: Self-pay | Admitting: Internal Medicine

## 2020-04-21 DEATH — deceased
